# Patient Record
Sex: Female | Born: 1960
Health system: Southern US, Community
[De-identification: ages and names within clinical notes are randomized; demographics above are authoritative.]

## PROBLEM LIST (undated history)

## (undated) DIAGNOSIS — S129XXA Fracture of neck, unspecified, initial encounter: Secondary | ICD-10-CM

## (undated) DIAGNOSIS — C569 Malignant neoplasm of unspecified ovary: Secondary | ICD-10-CM

## (undated) DIAGNOSIS — B1920 Unspecified viral hepatitis C without hepatic coma: Secondary | ICD-10-CM

## (undated) DIAGNOSIS — E079 Disorder of thyroid, unspecified: Secondary | ICD-10-CM

## (undated) DIAGNOSIS — C801 Malignant (primary) neoplasm, unspecified: Secondary | ICD-10-CM

## (undated) DIAGNOSIS — R200 Anesthesia of skin: Secondary | ICD-10-CM

## (undated) DIAGNOSIS — R202 Paresthesia of skin: Secondary | ICD-10-CM

## (undated) DIAGNOSIS — T148XXA Other injury of unspecified body region, initial encounter: Secondary | ICD-10-CM

## (undated) DIAGNOSIS — R251 Tremor, unspecified: Secondary | ICD-10-CM

## (undated) DIAGNOSIS — H269 Unspecified cataract: Secondary | ICD-10-CM

## (undated) DIAGNOSIS — J449 Chronic obstructive pulmonary disease, unspecified: Secondary | ICD-10-CM

## (undated) DIAGNOSIS — G709 Myoneural disorder, unspecified: Secondary | ICD-10-CM

## (undated) DIAGNOSIS — K219 Gastro-esophageal reflux disease without esophagitis: Secondary | ICD-10-CM

## (undated) DIAGNOSIS — N189 Chronic kidney disease, unspecified: Secondary | ICD-10-CM

## (undated) DIAGNOSIS — K529 Noninfective gastroenteritis and colitis, unspecified: Secondary | ICD-10-CM

## (undated) DIAGNOSIS — I10 Essential (primary) hypertension: Secondary | ICD-10-CM

## (undated) HISTORY — DX: Unspecified cataract: H26.9

## (undated) HISTORY — DX: Tremor, unspecified: R25.1

## (undated) HISTORY — DX: Unspecified viral hepatitis C without hepatic coma: B19.20

## (undated) HISTORY — DX: Noninfective gastroenteritis and colitis, unspecified: K52.9

## (undated) HISTORY — PX: CHOLECYSTECTOMY: SHX55

## (undated) HISTORY — DX: Malignant neoplasm of unspecified ovary: C56.9

## (undated) HISTORY — PX: FOOT SURGERY: SHX648

## (undated) HISTORY — DX: Anesthesia of skin: R20.0

## (undated) HISTORY — PX: KNEE ARTHROSCOPY: SUR90

## (undated) HISTORY — PX: NECK SURGERY: SHX720

## (undated) HISTORY — DX: Other injury of unspecified body region, initial encounter: T14.8XXA

## (undated) HISTORY — PX: VAGINAL HYSTERECTOMY: SUR661

## (undated) HISTORY — DX: Chronic kidney disease, unspecified: N18.9

## (undated) HISTORY — DX: Chronic obstructive pulmonary disease, unspecified: J44.9

## (undated) HISTORY — DX: Fracture of neck, unspecified, initial encounter: S12.9XXA

## (undated) HISTORY — DX: Gastro-esophageal reflux disease without esophagitis: K21.9

## (undated) HISTORY — DX: Disorder of thyroid, unspecified: E07.9

## (undated) HISTORY — PX: SHOULDER ARTHROSCOPY: SHX128

## (undated) HISTORY — DX: Myoneural disorder, unspecified: G70.9

## (undated) HISTORY — DX: Essential (primary) hypertension: I10

## (undated) HISTORY — DX: Anesthesia of skin: R20.2

---
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
## 2014-08-07 DIAGNOSIS — F333 Major depressive disorder, recurrent, severe with psychotic symptoms: Secondary | ICD-10-CM | POA: Insufficient documentation

## 2014-08-07 DIAGNOSIS — J309 Allergic rhinitis, unspecified: Secondary | ICD-10-CM | POA: Insufficient documentation

## 2014-08-07 DIAGNOSIS — R5383 Other fatigue: Secondary | ICD-10-CM | POA: Insufficient documentation

## 2014-08-07 DIAGNOSIS — R059 Cough, unspecified: Secondary | ICD-10-CM | POA: Insufficient documentation

## 2014-08-07 DIAGNOSIS — F411 Generalized anxiety disorder: Secondary | ICD-10-CM | POA: Insufficient documentation

## 2014-08-07 DIAGNOSIS — R05 Cough: Secondary | ICD-10-CM | POA: Insufficient documentation

## 2014-08-07 DIAGNOSIS — F419 Anxiety disorder, unspecified: Secondary | ICD-10-CM | POA: Insufficient documentation

## 2014-08-07 DIAGNOSIS — R413 Other amnesia: Secondary | ICD-10-CM | POA: Insufficient documentation

## 2014-08-07 DIAGNOSIS — Z720 Tobacco use: Secondary | ICD-10-CM | POA: Insufficient documentation

## 2016-06-17 DIAGNOSIS — G4700 Insomnia, unspecified: Secondary | ICD-10-CM | POA: Insufficient documentation

## 2016-06-17 DIAGNOSIS — I10 Essential (primary) hypertension: Secondary | ICD-10-CM | POA: Insufficient documentation

## 2016-06-17 DIAGNOSIS — M159 Polyosteoarthritis, unspecified: Secondary | ICD-10-CM | POA: Insufficient documentation

## 2016-06-17 DIAGNOSIS — M8949 Other hypertrophic osteoarthropathy, multiple sites: Secondary | ICD-10-CM | POA: Insufficient documentation

## 2016-06-17 DIAGNOSIS — E039 Hypothyroidism, unspecified: Secondary | ICD-10-CM | POA: Insufficient documentation

## 2016-06-17 DIAGNOSIS — B1920 Unspecified viral hepatitis C without hepatic coma: Secondary | ICD-10-CM | POA: Insufficient documentation

## 2016-06-17 DIAGNOSIS — F339 Major depressive disorder, recurrent, unspecified: Secondary | ICD-10-CM | POA: Insufficient documentation

## 2016-06-17 DIAGNOSIS — H04129 Dry eye syndrome of unspecified lacrimal gland: Secondary | ICD-10-CM | POA: Insufficient documentation

## 2016-06-17 DIAGNOSIS — M5020 Other cervical disc displacement, unspecified cervical region: Secondary | ICD-10-CM | POA: Insufficient documentation

## 2016-06-17 DIAGNOSIS — M150 Primary generalized (osteo)arthritis: Secondary | ICD-10-CM

## 2016-06-17 DIAGNOSIS — R5381 Other malaise: Secondary | ICD-10-CM | POA: Insufficient documentation

## 2016-06-17 DIAGNOSIS — R5383 Other fatigue: Secondary | ICD-10-CM | POA: Insufficient documentation

## 2016-06-17 DIAGNOSIS — A6004 Herpesviral vulvovaginitis: Secondary | ICD-10-CM | POA: Insufficient documentation

## 2016-06-17 DIAGNOSIS — K219 Gastro-esophageal reflux disease without esophagitis: Secondary | ICD-10-CM | POA: Insufficient documentation

## 2016-06-17 DIAGNOSIS — Q613 Polycystic kidney, unspecified: Secondary | ICD-10-CM | POA: Insufficient documentation

## 2016-07-02 DIAGNOSIS — M25561 Pain in right knee: Secondary | ICD-10-CM | POA: Insufficient documentation

## 2016-07-26 ENCOUNTER — Ambulatory Visit (INDEPENDENT_AMBULATORY_CARE_PROVIDER_SITE_OTHER): Payer: Medicare Other | Admitting: Orthopaedic Surgery

## 2016-07-26 DIAGNOSIS — M25562 Pain in left knee: Secondary | ICD-10-CM | POA: Diagnosis not present

## 2016-07-26 DIAGNOSIS — M25512 Pain in left shoulder: Secondary | ICD-10-CM

## 2016-07-26 DIAGNOSIS — M545 Low back pain: Secondary | ICD-10-CM

## 2016-07-26 DIAGNOSIS — M25511 Pain in right shoulder: Secondary | ICD-10-CM

## 2016-07-26 DIAGNOSIS — M25561 Pain in right knee: Secondary | ICD-10-CM

## 2016-07-28 ENCOUNTER — Other Ambulatory Visit (HOSPITAL_COMMUNITY): Payer: Self-pay | Admitting: Gastroenterology

## 2016-07-28 ENCOUNTER — Other Ambulatory Visit: Payer: Self-pay | Admitting: Gastroenterology

## 2016-07-28 DIAGNOSIS — B1920 Unspecified viral hepatitis C without hepatic coma: Secondary | ICD-10-CM

## 2016-08-25 DIAGNOSIS — F1320 Sedative, hypnotic or anxiolytic dependence, uncomplicated: Secondary | ICD-10-CM | POA: Insufficient documentation

## 2016-08-26 DIAGNOSIS — F17200 Nicotine dependence, unspecified, uncomplicated: Secondary | ICD-10-CM | POA: Insufficient documentation

## 2016-08-26 DIAGNOSIS — F3181 Bipolar II disorder: Secondary | ICD-10-CM | POA: Insufficient documentation

## 2016-09-01 ENCOUNTER — Ambulatory Visit (HOSPITAL_COMMUNITY): Payer: Self-pay

## 2016-09-06 ENCOUNTER — Ambulatory Visit (INDEPENDENT_AMBULATORY_CARE_PROVIDER_SITE_OTHER): Payer: Medicare Other | Admitting: Orthopaedic Surgery

## 2016-09-06 VITALS — BP 121/77 | HR 65 | Resp 14

## 2016-09-06 DIAGNOSIS — M25512 Pain in left shoulder: Secondary | ICD-10-CM

## 2016-09-06 DIAGNOSIS — G8929 Other chronic pain: Secondary | ICD-10-CM | POA: Diagnosis not present

## 2016-09-06 MED ORDER — LIDOCAINE HCL 1 % IJ SOLN
2.0000 mL | INTRAMUSCULAR | Status: AC | PRN
  Administered 2016-09-06: 2 mL

## 2016-09-06 MED ORDER — BUPIVACAINE HCL 0.5 % IJ SOLN
2.0000 mL | INTRAMUSCULAR | Status: AC | PRN
  Administered 2016-09-06: 2 mL via INTRA_ARTICULAR

## 2016-09-06 MED ORDER — METHYLPREDNISOLONE ACETATE 40 MG/ML IJ SUSP
80.0000 mg | INTRAMUSCULAR | Status: AC | PRN
  Administered 2016-09-06: 80 mg

## 2016-09-06 NOTE — Progress Notes (Signed)
Office Visit Note   Patient: Raven Hansen           Date of Birth: 1961-09-07           MRN: [id number] Visit Date: 09/06/2016              Requested by: No referring provider defined for this encounter. PCP: No primary care provider on file.   Assessment & Plan: Visit Diagnoses: No diagnosis found.  Plan: f/u prn. Mrs. Kocinski was seen last month for evaluation of multiple joint complaints is outlined in her prior office note. Performed cortisone injections to both of her knees and she relates that made a big difference. She also has history of chronic right and left shoulder pain treated by her orthopedist in [place],   I will plan on injecting her right shoulder today and see her back in a when necessary basis. [place]. She plans on moving bacteria in the next several weeks and wanted to know if she could have cortisone injection in her right shoulder she's had prior rotator cuff surgery and has also been told she has "degenerative joint disease"  Follow-Up Instructions: No Follow-up on file.   Orders:  No orders of the defined types were placed in this encounter.  No orders of the defined types were placed in this encounter.     Procedures: Large Joint Inj Date/Time: 09/06/2016 5:31 PM Performed by: [name] Authorized by: [name]   Consent Given by:  Patient Timeout: prior to procedure the correct patient, procedure, and site was verified   Indications:  Pain Location:  Shoulder Site:  L subacromial bursa Prep: patient was prepped and draped in usual sterile fashion   Needle Size:  25 G Needle Length:  1.5 inches Approach:  Lateral Ultrasound Guidance: No   Fluoroscopic Guidance: No   Arthrogram: No   Medications:  2 mL bupivacaine 0.5 %; 2 mL lidocaine 1 %; 80 mg methylPREDNISolone acetate 40 MG/ML Aspiration Attempted: No   Patient tolerance:  Patient tolerated the procedure well with no immediate complications       Clinical  Data: No additional findings.   Subjective: No chief complaint on file.   BIL cortisone injections wanted today. Pt does not want to answer questions, states she will return to her family dr. Soon. Pt states both arms hurt and cannot lay on either side    Review of Systems   Objective: Vital Signs: There were no vitals taken for this visit.  Physical Exam  Ortho Exam right shoulder with some loss of overhead motion. He has prior arthroscopic portals that were nontender. He was minimal impingement. Pain seemed to be localized about the subacromial space and not at the glenohumeral joint. He was a patient. Neurologically she appeared to be intact  I will plan on injecting her right shoulder today as it is the more symptomatic and see her back in a when necessary basis.  Specialty Comments:  No specialty comments available.  Imaging: No results found.   PMFS History: There are no active problems to display for this patient.  No past medical history on file.  No family history on file.  No past surgical history on file. Social History   Occupational History  . Not on file.   Social History Main Topics  . Smoking status: Not on file  . Smokeless tobacco: Not on file  . Alcohol use Not on file  . Drug use: Unknown  . Sexual activity: Not  on file

## 2016-09-07 ENCOUNTER — Ambulatory Visit (HOSPITAL_COMMUNITY): Payer: Self-pay

## 2016-10-22 ENCOUNTER — Ambulatory Visit (INDEPENDENT_AMBULATORY_CARE_PROVIDER_SITE_OTHER): Payer: Medicare Other | Admitting: Orthopaedic Surgery

## 2017-01-14 DIAGNOSIS — K529 Noninfective gastroenteritis and colitis, unspecified: Secondary | ICD-10-CM | POA: Insufficient documentation

## 2017-01-19 DIAGNOSIS — J42 Unspecified chronic bronchitis: Secondary | ICD-10-CM | POA: Insufficient documentation

## 2017-01-19 DIAGNOSIS — M5090 Cervical disc disorder, unspecified, unspecified cervical region: Secondary | ICD-10-CM | POA: Insufficient documentation

## 2017-01-19 DIAGNOSIS — G43519 Persistent migraine aura without cerebral infarction, intractable, without status migrainosus: Secondary | ICD-10-CM | POA: Insufficient documentation

## 2017-01-19 DIAGNOSIS — S90111A Contusion of right great toe without damage to nail, initial encounter: Secondary | ICD-10-CM | POA: Insufficient documentation

## 2017-01-19 DIAGNOSIS — F419 Anxiety disorder, unspecified: Secondary | ICD-10-CM | POA: Insufficient documentation

## 2017-01-19 DIAGNOSIS — J069 Acute upper respiratory infection, unspecified: Secondary | ICD-10-CM | POA: Insufficient documentation

## 2017-01-19 DIAGNOSIS — F329 Major depressive disorder, single episode, unspecified: Secondary | ICD-10-CM | POA: Insufficient documentation

## 2017-02-17 DIAGNOSIS — S83519A Sprain of anterior cruciate ligament of unspecified knee, initial encounter: Secondary | ICD-10-CM | POA: Insufficient documentation

## 2017-02-17 DIAGNOSIS — R569 Unspecified convulsions: Secondary | ICD-10-CM | POA: Insufficient documentation

## 2017-02-17 DIAGNOSIS — K047 Periapical abscess without sinus: Secondary | ICD-10-CM | POA: Insufficient documentation

## 2017-02-17 DIAGNOSIS — M1710 Unilateral primary osteoarthritis, unspecified knee: Secondary | ICD-10-CM | POA: Insufficient documentation

## 2017-02-17 DIAGNOSIS — L720 Epidermal cyst: Secondary | ICD-10-CM | POA: Insufficient documentation

## 2017-02-17 DIAGNOSIS — M961 Postlaminectomy syndrome, not elsewhere classified: Secondary | ICD-10-CM | POA: Insufficient documentation

## 2017-02-17 DIAGNOSIS — M7541 Impingement syndrome of right shoulder: Secondary | ICD-10-CM | POA: Insufficient documentation

## 2017-02-17 DIAGNOSIS — R233 Spontaneous ecchymoses: Secondary | ICD-10-CM | POA: Insufficient documentation

## 2017-02-17 DIAGNOSIS — J200 Acute bronchitis due to Mycoplasma pneumoniae: Secondary | ICD-10-CM | POA: Insufficient documentation

## 2017-02-17 DIAGNOSIS — G894 Chronic pain syndrome: Secondary | ICD-10-CM | POA: Insufficient documentation

## 2017-02-17 DIAGNOSIS — M179 Osteoarthritis of knee, unspecified: Secondary | ICD-10-CM | POA: Insufficient documentation

## 2017-02-17 DIAGNOSIS — N649 Disorder of breast, unspecified: Secondary | ICD-10-CM | POA: Insufficient documentation

## 2017-02-17 DIAGNOSIS — E162 Hypoglycemia, unspecified: Secondary | ICD-10-CM | POA: Insufficient documentation

## 2017-02-17 DIAGNOSIS — D649 Anemia, unspecified: Secondary | ICD-10-CM | POA: Insufficient documentation

## 2017-02-17 DIAGNOSIS — M5412 Radiculopathy, cervical region: Secondary | ICD-10-CM | POA: Insufficient documentation

## 2017-03-10 DIAGNOSIS — M7918 Myalgia, other site: Secondary | ICD-10-CM | POA: Insufficient documentation

## 2017-03-10 DIAGNOSIS — L237 Allergic contact dermatitis due to plants, except food: Secondary | ICD-10-CM | POA: Insufficient documentation

## 2017-04-13 DIAGNOSIS — F3170 Bipolar disorder, currently in remission, most recent episode unspecified: Secondary | ICD-10-CM | POA: Insufficient documentation

## 2017-06-22 DIAGNOSIS — Z803 Family history of malignant neoplasm of breast: Secondary | ICD-10-CM | POA: Insufficient documentation

## 2017-06-22 DIAGNOSIS — Z8543 Personal history of malignant neoplasm of ovary: Secondary | ICD-10-CM | POA: Insufficient documentation

## 2017-06-30 DIAGNOSIS — S50811A Abrasion of right forearm, initial encounter: Secondary | ICD-10-CM | POA: Insufficient documentation

## 2017-06-30 DIAGNOSIS — W5503XA Scratched by cat, initial encounter: Secondary | ICD-10-CM | POA: Insufficient documentation

## 2017-07-14 DIAGNOSIS — R2231 Localized swelling, mass and lump, right upper limb: Secondary | ICD-10-CM | POA: Insufficient documentation

## 2017-12-14 DIAGNOSIS — Z01818 Encounter for other preprocedural examination: Secondary | ICD-10-CM | POA: Insufficient documentation

## 2018-04-14 DIAGNOSIS — L239 Allergic contact dermatitis, unspecified cause: Secondary | ICD-10-CM | POA: Insufficient documentation

## 2018-05-09 DIAGNOSIS — Z209 Contact with and (suspected) exposure to unspecified communicable disease: Secondary | ICD-10-CM | POA: Insufficient documentation

## 2018-05-09 DIAGNOSIS — F40243 Fear of flying: Secondary | ICD-10-CM | POA: Insufficient documentation

## 2018-05-09 DIAGNOSIS — E782 Mixed hyperlipidemia: Secondary | ICD-10-CM | POA: Insufficient documentation

## 2018-05-09 DIAGNOSIS — Z91030 Bee allergy status: Secondary | ICD-10-CM | POA: Insufficient documentation

## 2018-09-14 ENCOUNTER — Ambulatory Visit (INDEPENDENT_AMBULATORY_CARE_PROVIDER_SITE_OTHER): Payer: Medicare Other

## 2018-09-14 ENCOUNTER — Ambulatory Visit (INDEPENDENT_AMBULATORY_CARE_PROVIDER_SITE_OTHER): Payer: Medicare Other | Admitting: Orthopaedic Surgery

## 2018-09-14 ENCOUNTER — Encounter (INDEPENDENT_AMBULATORY_CARE_PROVIDER_SITE_OTHER): Payer: Self-pay | Admitting: Orthopaedic Surgery

## 2018-09-14 VITALS — BP 137/98 | HR 70 | Resp 18 | Ht 64.0 in | Wt 167.0 lb

## 2018-09-14 DIAGNOSIS — M25511 Pain in right shoulder: Secondary | ICD-10-CM

## 2018-09-14 DIAGNOSIS — M25512 Pain in left shoulder: Secondary | ICD-10-CM

## 2018-09-14 DIAGNOSIS — G8929 Other chronic pain: Secondary | ICD-10-CM

## 2018-09-14 MED ORDER — LIDOCAINE HCL 2 % IJ SOLN
2.0000 mL | INTRAMUSCULAR | Status: AC | PRN
  Administered 2018-09-14: 2 mL

## 2018-09-14 MED ORDER — BUPIVACAINE HCL 0.5 % IJ SOLN
2.0000 mL | INTRAMUSCULAR | Status: AC | PRN
  Administered 2018-09-14: 2 mL via INTRA_ARTICULAR

## 2018-09-14 MED ORDER — METHYLPREDNISOLONE ACETATE 40 MG/ML IJ SUSP
80.0000 mg | INTRAMUSCULAR | Status: AC | PRN
  Administered 2018-09-14: 80 mg

## 2018-09-14 NOTE — Progress Notes (Signed)
Office Visit Note   Patient: Raven Hansen           Date of Birth: 28-Dec-1960           MRN: [id number] Visit Date: 09/14/2018              Requested by: No referring provider defined for this encounter. PCP: No primary care provider on file.   Assessment & Plan: Visit Diagnoses:  1. Chronic right shoulder pain     Plan: Impingement syndrome both shoulders by history and reviewing her prior charts.  Could have some very early arthritis in her right shoulder.  She has responded to subacromial cortisone injections in the past.  Will inject both shoulders today referred to Dr. Sharol Given for her right foot  Follow-Up Instructions: Return refer to Dr Sharol Given for right foot problem.   Orders:  Orders Placed This Encounter  Procedures  . XR Shoulder Right   No orders of the defined types were placed in this encounter.     Procedures: Large Joint Inj: R subacromial bursa on 09/14/2018 3:46 PM Indications: pain and diagnostic evaluation Details: 25 G 1.5 in needle, anterolateral approach  Arthrogram: No  Medications: 2 mL bupivacaine 0.5 %; 2 mL lidocaine 2 %; 80 mg methylPREDNISolone acetate 40 MG/ML Consent was given by the patient. Immediately prior to procedure a time out was called to verify the correct patient, procedure, equipment, support staff and site/side marked as required. Patient was prepped and draped in the usual sterile fashion.   Large Joint Inj: L subacromial bursa on 09/14/2018 3:48 PM Indications: pain and diagnostic evaluation Details: 25 G 1.5 in needle, anterolateral approach  Arthrogram: No  Medications: 2 mL lidocaine 2 %; 2 mL bupivacaine 0.5 %; 80 mg methylPREDNISolone acetate 40 MG/ML Consent was given by the patient. Immediately prior to procedure a time out was called to verify the correct patient, procedure, equipment, support staff and site/side marked as required. Patient was prepped and draped in the usual sterile fashion.       Clinical  Data: No additional findings.   Subjective: Chief Complaint  Patient presents with  . Left Shoulder - Pain  . Right Shoulder - Pain  . Right Foot - Pain  . Shoulder Pain    Bil shoulder pain, no injury, right rotator cuff, bone spurs surgery x 40 years [place], Neck surgery 2010 [place], limited range of motion, right worse than left, difficulty sleeping at night, Tylenol, Gabapentin, helps  . Foot Pain    Right foot pain, dropped log on foot, fusion, 12/19/17 [place], screw broke 05/23/18, severe pain, Tylenol, difficulty walking,  Raven Hansen visited the office for evaluation of a number of problems.  She recently has been living in [place] and has had surgery on her right foot with apparent nonunion.  She does wear a boot and notes that she has a "broken screw".  She had asked for referral to [facility] but I told her that her treating physician in [place] should make that call and send all of all records.  In lieu of that I could have her see Dr. Sharol Given. She also relates having had rotator cuff surgery in her right shoulder "30 years ago".  She is developed some pain with motion of her shoulder and no recurrent injury or trauma.  She is having trouble sleeping and moving her arm over her head.  No numbness or tingling. Saw Mrs. Uplinger in November 2017 for similar problems with both of her shoulders.  I did inject the right shoulder with good relief  HPI  Review of Systems  Constitutional: Negative for fatigue.  HENT: Negative for trouble swallowing.   Eyes: Negative for pain.  Respiratory: Negative for shortness of breath.   Cardiovascular: Negative for leg swelling.  Gastrointestinal: Positive for diarrhea.  Endocrine: Positive for cold intolerance.  Genitourinary: Negative for difficulty urinating.  Musculoskeletal: Positive for joint swelling, neck pain and neck stiffness.  Skin: Negative for rash.  Allergic/Immunologic: Negative for food allergies.  Neurological:  Positive for weakness and numbness.  Hematological: Does not bruise/bleed easily.  Psychiatric/Behavioral: Positive for sleep disturbance.     Objective: Vital Signs: BP (!) 137/98 (BP Location: Left Arm, Patient Position: Sitting, Cuff Size: Normal)   Pulse 70   Resp 18   Ht 5\' 4"  (1.626 m)   Wt 163 lb (73.9 kg)   BMI 27.98 kg/m   Physical Exam  Constitutional: She is oriented to person, place, and time. She appears well-developed and well-nourished.  HENT:  Mouth/Throat: Oropharynx is clear and moist.  Eyes: Pupils are equal, round, and reactive to light. EOM are normal.  Pulmonary/Chest: Effort normal.  Neurological: She is alert and oriented to person, place, and time.  Skin: Skin is warm and dry.  Psychiatric: She has a normal mood and affect. Her behavior is normal.    Ortho Exam wearing equalizer boot right foot.  Exam not performed.  Right shoulder with well-healed arthroscopic portals from her prior surgery in [place].  Full overhead motion.  Mild impingement symptoms.  Good grip and good release.  Biceps intact.  Good strength with internal/external rotation.  Specialty Comments:  No specialty comments available.  Imaging: No results found.   PMFS History: There are no active problems to display for this patient.  Past Medical History:  Diagnosis Date  . Acid reflux   . Colitis   . Hypertension   . Thyroid disease     History reviewed. No pertinent family history.  Past Surgical History:  Procedure Laterality Date  . CHOLECYSTECTOMY    . FOOT SURGERY    . KNEE ARTHROSCOPY    . NECK SURGERY    . SHOULDER ARTHROSCOPY    . VAGINAL HYSTERECTOMY     Social History   Occupational History  . Not on file  Tobacco Use  . Smoking status: Current Every Day Smoker    Packs/day: 0.50    Years: 37.00    Pack years: 18.50    Types: Cigarettes  . Smokeless tobacco: Never Used  Substance and Sexual Activity  . Alcohol use: Yes    Comment: RARELY    . Drug use: Never  . Sexual activity: Not on file

## 2018-09-19 ENCOUNTER — Other Ambulatory Visit: Payer: Self-pay

## 2018-09-19 ENCOUNTER — Ambulatory Visit (INDEPENDENT_AMBULATORY_CARE_PROVIDER_SITE_OTHER): Payer: Medicare Other | Admitting: Podiatry

## 2018-09-19 DIAGNOSIS — M9689 Other intraoperative and postprocedural complications and disorders of the musculoskeletal system: Secondary | ICD-10-CM

## 2018-09-19 DIAGNOSIS — Z9889 Other specified postprocedural states: Secondary | ICD-10-CM

## 2018-09-19 DIAGNOSIS — M24176 Other articular cartilage disorders, unspecified foot: Secondary | ICD-10-CM

## 2018-09-19 DIAGNOSIS — Z720 Tobacco use: Secondary | ICD-10-CM | POA: Diagnosis not present

## 2018-09-19 DIAGNOSIS — M249 Joint derangement, unspecified: Secondary | ICD-10-CM

## 2018-09-19 DIAGNOSIS — M24173 Other articular cartilage disorders, unspecified ankle: Secondary | ICD-10-CM

## 2018-09-19 DIAGNOSIS — Z978 Presence of other specified devices: Secondary | ICD-10-CM

## 2018-09-19 DIAGNOSIS — T849XXA Unspecified complication of internal orthopedic prosthetic device, implant and graft, initial encounter: Secondary | ICD-10-CM

## 2018-09-19 DIAGNOSIS — M25571 Pain in right ankle and joints of right foot: Secondary | ICD-10-CM | POA: Diagnosis not present

## 2018-09-19 NOTE — Progress Notes (Signed)
Subjective:  Patient ID: Raven Hansen, female    DOB: Feb 24, 1961,  MRN: [id number]  No chief complaint on file.   57 y.o. female presents with the above complaint.  Reports history of first metatarsophalangeal joint arthrodesis to the right foot 12/19/2017.  States that her postop course uneventful and he did not hurt after she was cleared to resume normal activity.  States that on July 30th  she injured her foot going up a ladder and went for evaluation and was told that 1 of the screws is broken.  At present she has 10-10 sharp constant pain worse with walking.  Has tried icing Tylenol and is wearing her cam boot.  Admits to current tobacco use.  Review of Systems: Negative except as noted in the HPI. Denies N/V/F/Ch.  Past Medical History:  Diagnosis Date  . Acid reflux   . Colitis   . Hypertension   . Thyroid disease     Current Outpatient Medications:  .  albuterol (VENTOLIN HFA) 108 (90 Base) MCG/ACT inhaler, inhale 2 puffs by mouth every 4 hours for wheezing, Disp: , Rfl:  .  atenolol (TENORMIN) 50 MG tablet, Take 50 mg by mouth daily., Disp: , Rfl: 0 .  cephALEXin (KEFLEX) 500 MG capsule, Take 500 mg by mouth 4 (four) times daily., Disp: , Rfl:  .  cholestyramine (QUESTRAN) 4 g packet, MIX 1 PACKET as directed and take by mouth four times a day, Disp: , Rfl:  .  fluticasone (FLONASE) 50 MCG/ACT nasal spray, INSTILL 1 SPRAY INTO EACH NOSTRIL TWICE A DAY, Disp: , Rfl: 2 .  gabapentin (NEURONTIN) 600 MG tablet, Take 300 mg by mouth., Disp: , Rfl:  .  HYDROcodone-acetaminophen (NORCO/VICODIN) 5-325 MG tablet, , Disp: , Rfl:  .  ibuprofen (ADVIL,MOTRIN) 800 MG tablet, take 1 tablet by mouth three times a day if needed for pain, Disp: , Rfl:  .  lisinopril-hydrochlorothiazide (PRINZIDE,ZESTORETIC) 20-25 MG tablet, TK 1 T PO QD, Disp: , Rfl: 8 .  SUMAtriptan (IMITREX) 100 MG tablet, Take 100 mg by mouth., Disp: , Rfl:  .  SYNTHROID 125 MCG tablet, TK 1 T PO DAILY 20 TO 30 MINUTES  BEFORE FIRST MEAL, Disp: , Rfl: 2 .  valACYclovir (VALTREX) 1000 MG tablet, Take 1,000 mg by mouth., Disp: , Rfl:   Social History   Tobacco Use  Smoking Status Current Every Day Smoker  . Packs/day: 0.50  . Years: 37.00  . Pack years: 18.50  . Types: Cigarettes  Smokeless Tobacco Never Used    Allergies  Allergen Reactions  . Bee Venom Swelling  . Demerol [Meperidine Hcl]   . Meperidine Other (See Comments)    headaches   Objective:  There were no vitals filed for this visit. There is no height or weight on file to calculate BMI. Constitutional Well developed. Well nourished.  Vascular Dorsalis pedis pulses palpable bilaterally. Posterior tibial pulses palpable bilaterally. Capillary refill normal to all digits.  No cyanosis or clubbing noted. Pedal hair growth normal.  Neurologic Normal speech. Oriented to person, place, and time. Epicritic sensation to light touch grossly present bilaterally.  Dermatologic Nails well groomed and normal in appearance. No open wounds. No skin lesions.  Orthopedic: Normal joint ROM without pain or crepitus bilaterally. No visible deformities. Pain palpation about the first metatarsophalangeal joint right foot with slight appreciable motion.  The joint does not appear clinically fused.   Radiographs: Prior XR reviewed. 1st MPJ fusion site with incomplete consolidation. Crossed screw construct with  failure of one of the screws.   Assessment:   1. 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 Pain in joint of right foot   3. Tobacco abuse   4. History of foot surgery   5. Joint derangement of ankle or foot   6. Orthopedic hardware present   7. Orthopedic device, implant, or graft complication ([place])    Plan:  Patient was evaluated and treated and all questions answered.  History of right first metatarsal phalangeal joint arthrodesis with evidence of nonunion, hardware failure -Prior x-rays and records were reviewed.  Evidence of broken  screw. -I do not believe that the mechanism of injury by itself would need to the fracture of the hardware and is more likely that the osteotomy site never appropriately healed. -Would consider nonunion work-up including blood work to rule out infected nonunion, possible CT scan -I do think she would benefit from revision of the arthrodesis site with likely bone graft.  Discussed postop course at length including extended nonweightbearing.  Discussed need for cessation of smoking in order to promote optimal bone healing. -I discussed with patient that I think she needs a referral to a surgeon who has more expertise in complicated revision cases.  We will plan for referral. -No pain medicines Rx at this time as she has chronic pain and will need to follow-up with either pain management or her PCP.  45 minutes of face to face time were spent with the patient. >50% of this was spent on counseling and coordination of care. Specifically discussed with patient the above diagnoses and overall treatment plan.   No follow-ups on file.

## 2018-09-20 ENCOUNTER — Telehealth: Payer: Self-pay | Admitting: *Deleted

## 2018-09-20 DIAGNOSIS — M9689 Other intraoperative and postprocedural complications and disorders of the musculoskeletal system: Secondary | ICD-10-CM

## 2018-09-20 DIAGNOSIS — Z978 Presence of other specified devices: Secondary | ICD-10-CM

## 2018-09-20 DIAGNOSIS — M24176 Other articular cartilage disorders, unspecified foot: Secondary | ICD-10-CM

## 2018-09-20 DIAGNOSIS — Z9889 Other specified postprocedural states: Secondary | ICD-10-CM

## 2018-09-20 DIAGNOSIS — M249 Joint derangement, unspecified: Secondary | ICD-10-CM

## 2018-09-20 DIAGNOSIS — M24173 Other articular cartilage disorders, unspecified ankle: Secondary | ICD-10-CM

## 2018-09-20 DIAGNOSIS — M25571 Pain in right ankle and joints of right foot: Secondary | ICD-10-CM

## 2018-09-20 NOTE — Telephone Encounter (Signed)
Dr. March Rummage states refer pt to [facility] [phone]. Faxed referral, clinicals and demographics to [facility].

## 2018-09-20 NOTE — Telephone Encounter (Signed)
-----   Message from Evelina Bucy, DPM sent at 09/20/2018 10:58 AM EST ----- Can we refer to [facility] for surgical eval?

## 2018-09-20 NOTE — Telephone Encounter (Signed)
-----   Message from Evelina Bucy, DPM sent at 09/20/2018 10:58 AM EST ----- Can we refer to [facility] for surgical eval?

## 2018-09-28 ENCOUNTER — Ambulatory Visit (INDEPENDENT_AMBULATORY_CARE_PROVIDER_SITE_OTHER): Payer: Medicare Other

## 2018-09-28 ENCOUNTER — Ambulatory Visit (INDEPENDENT_AMBULATORY_CARE_PROVIDER_SITE_OTHER): Payer: Self-pay

## 2018-09-28 ENCOUNTER — Encounter (INDEPENDENT_AMBULATORY_CARE_PROVIDER_SITE_OTHER): Payer: Self-pay | Admitting: Orthopedic Surgery

## 2018-09-28 ENCOUNTER — Ambulatory Visit (INDEPENDENT_AMBULATORY_CARE_PROVIDER_SITE_OTHER): Payer: Medicare Other | Admitting: Orthopedic Surgery

## 2018-09-28 VITALS — Ht 64.0 in | Wt 167.0 lb

## 2018-09-28 DIAGNOSIS — M25562 Pain in left knee: Secondary | ICD-10-CM | POA: Diagnosis not present

## 2018-09-28 DIAGNOSIS — Q7021 Fused toes, right foot: Secondary | ICD-10-CM

## 2018-09-28 DIAGNOSIS — M79671 Pain in right foot: Secondary | ICD-10-CM

## 2018-09-28 MED ORDER — OXYCODONE-ACETAMINOPHEN 5-325 MG PO TABS: 1.0000 | ORAL_TABLET | Freq: Three times a day (TID) | ORAL | 0 refills | Status: DC | PRN

## 2018-09-28 MED ORDER — GABAPENTIN 300 MG PO CAPS: 300.0000 mg | ORAL_CAPSULE | Freq: Three times a day (TID) | ORAL | 3 refills | Status: DC

## 2018-09-28 MED ORDER — IBUPROFEN 800 MG PO TABS: 800.0000 mg | ORAL_TABLET | Freq: Three times a day (TID) | ORAL | 0 refills | Status: DC | PRN

## 2018-09-29 ENCOUNTER — Encounter (INDEPENDENT_AMBULATORY_CARE_PROVIDER_SITE_OTHER): Payer: Self-pay | Admitting: Orthopedic Surgery

## 2018-09-29 DIAGNOSIS — M25562 Pain in left knee: Secondary | ICD-10-CM | POA: Diagnosis not present

## 2018-09-29 DIAGNOSIS — M79671 Pain in right foot: Secondary | ICD-10-CM | POA: Diagnosis not present

## 2018-09-29 MED ORDER — LIDOCAINE HCL 1 % IJ SOLN
5.0000 mL | INTRAMUSCULAR | Status: AC | PRN
  Administered 2018-09-29: 5 mL

## 2018-09-29 MED ORDER — METHYLPREDNISOLONE ACETATE 40 MG/ML IJ SUSP
40.0000 mg | INTRAMUSCULAR | Status: AC | PRN
  Administered 2018-09-29: 40 mg via INTRA_ARTICULAR

## 2018-09-29 NOTE — Progress Notes (Signed)
Office Visit Note   Patient: Raven Hansen           Date of Birth: Oct 16, 1961           MRN: [id number] Visit Date: 09/28/2018              Requested by: No referring provider defined for this encounter. PCP: Patient, No Pcp Per  Chief Complaint  Patient presents with  . Right Foot - Pain      HPI: Patient is a 57 year old woman who presents with 2 separate problems.  She did have hallux rigidus and initially underwent MTP fusion of the right foot in February 2019 in [place].  Patient was seen for a second opinion and told she had 1 broken screw.  Patient states she has been on hepatitis C medicine in 2003 and states that her markers are negative at this time.  Patient has been on Neurontin for her foot pain as well as ibuprofen.  Patient brings the notes from both her surgeon and the second opinion surgeon.  These were reviewed.  Patient will send [place] copies of these notes.  She also brought radiographs on a CD discs and these were also reviewed.  Assessment & Plan: Visit Diagnoses:  1. Right foot pain   2. Acute pain of left knee   3. 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  Recommended removal of the retained hardware placement of a dorsal Synthes plate.  Risks and benefits were discussed including infection nonunion need for additional surgery shortening of the toe.  Patient states she understands and wishes to proceed.  Patient does have radiographs consistent with pseudogout of the left knee.  This was injected she tolerated this well we will follow-up as needed for the knee.  Patient was sent in a refill for her Neurontin and ibuprofen.  Follow-Up Instructions: Return if symptoms worsen or fail to improve.   Ortho Exam  Patient is alert, oriented, no adenopathy, well-dressed, normal affect, normal respiratory effort. Examination of  the left lower extremity patient has crepitation in the patellofemoral joint with range of motion collaterals  and cruciates are stable.  There is no tenderness to palpation over the medial or lateral joint line.  There is no redness no cellulitis no effusion.  Examination of the right foot she has good pulses there is no redness no cellulitis there is tenderness to palpation around the MTP joint pain with attempted range of motion.  Radiographs shows 2 broken crossing screws.  Review of the records show these are Arthrex cannulated screws.  Imaging: No results found. No images are attached to the encounter.  Labs: No results found for: HGBA1C, ESRSEDRATE, CRP, LABURIC, REPTSTATUS, GRAMSTAIN, CULT, LABORGA   No results found for: ALBUMIN, PREALBUMIN, LABURIC  Body mass index is 28.67 kg/m.  Orders:  Orders Placed This Encounter  Procedures  . XR Knee 1-2 Views Left   Meds ordered this encounter  Medications  . oxyCODONE-acetaminophen (PERCOCET/ROXICET) 5-325 MG tablet    Sig: Take 1 tablet by mouth every 8 (eight) hours as needed.    Dispense:  10 tablet    Refill:  0  . gabapentin (NEURONTIN) 300 MG capsule    Sig: Take 1 capsule (300 mg total) by mouth 3 (three) times daily. 3 times a day when necessary neuropathy pain  Dispense:  90 capsule    Refill:  3  . ibuprofen (ADVIL,MOTRIN) 800 MG tablet    Sig: Take 1 tablet (800 mg total) by mouth every 8 (eight) hours as needed.    Dispense:  30 tablet    Refill:  0     Procedures: Large Joint Inj: L knee on 09/29/2018 4:13 PM Indications: pain and diagnostic evaluation Details: 22 G 1.5 in needle, anteromedial approach  Arthrogram: No  Medications: 5 mL lidocaine 1 %; 40 mg methylPREDNISolone acetate 40 MG/ML Outcome: tolerated well, no immediate complications Procedure, treatment alternatives, risks and benefits explained, specific risks discussed. Consent was given by the patient. Immediately prior to procedure a time  out was called to verify the correct patient, procedure, equipment, support staff and site/side marked as required. Patient was prepped and draped in the usual sterile fashion.      Clinical Data: No additional findings.  ROS:  All other systems negative, except as noted in the HPI. Review of Systems  Objective: Vital Signs: Ht 5\' 4"  (1.626 m)   Wt 167 lb (75.8 kg)   BMI 28.67 kg/m   Specialty Comments:  No specialty comments available.  PMFS History: Patient Active Problem List   Diagnosis Date Noted  . Exposure to communicable disease 05/09/2018  . Flying phobia 05/09/2018  . H/O bee sting allergy 05/09/2018  . Mixed hyperlipidemia 05/09/2018  . Allergic contact dermatitis 04/14/2018  . Preop general physical exam 12/14/2017  . Mass of right hand 07/14/2017  . Cat scratch of forearm, right, initial encounter 06/30/2017  . Family history of breast cancer 06/22/2017  . History of ovarian cancer 06/22/2017  . Bipolar affective disorder in remission ([place]) 04/13/2017  . Allergic dermatitis due to poison ivy 03/10/2017  . Myofascial pain 03/10/2017  . Acute bronchitis due to Mycoplasma pneumoniae 02/17/2017  . Anemia 02/17/2017  . Cervical radiculopathy 02/17/2017  . Chronic pain syndrome 02/17/2017  . Complete tear of anterior cruciate ligament of knee 02/17/2017  . Dental infection 02/17/2017  . Epidermal cyst 02/17/2017  . Failed back surgical syndrome 02/17/2017  . Hypoglycemia 02/17/2017  . Lesion of nipple 02/17/2017  . Osteoarthrosis of knee 02/17/2017  . Petechiae 02/17/2017  . Rotator cuff impingement syndrome of right shoulder 02/17/2017  . Seizures ([place]) 02/17/2017  . Anxiety and depression 01/19/2017  . Cervical disc disease 01/19/2017  . Chronic bronchitis ([place]) 01/19/2017  . Contusion of right great toe without damage to nail 01/19/2017  . Migraine aura, persistent, intractable 01/19/2017  . Viral upper respiratory tract infection 01/19/2017  .  Chronic diarrhea 01/14/2017  . Bipolar 2 disorder, major depressive episode ([place]) 08/26/2016  . Tobacco use disorder, severe, dependence 08/26/2016  . Benzodiazepine dependence ([place]) 08/25/2016  . Pain in right knee 07/02/2016  . Dry eye syndrome 06/17/2016  . Episode of recurrent major depressive disorder ([place]) 06/17/2016  . Essential hypertension 06/17/2016  . GERD without esophagitis 06/17/2016  . Hepatitis C 06/17/2016  . Herniated cervical disc 06/17/2016  . Herpes simplex vulvovaginitis 06/17/2016  . Hypothyroidism (acquired) 06/17/2016  . Insomnia 06/17/2016  . Malaise and fatigue 06/17/2016  . Polycystic kidney disease 06/17/2016  . Primary osteoarthritis involving multiple joints 06/17/2016  . Allergic rhinitis 08/07/2014  . Anxiety state 08/07/2014  . Cough 08/07/2014  . Other amnesia 08/07/2014  . Other fatigue 08/07/2014  . Severe episode of recurrent major depressive disorder, with psychotic features ([place]) 08/07/2014  . Tobacco use 08/07/2014   Past Medical History:  Diagnosis Date  .  Acid reflux   . Colitis   . Hypertension   . Thyroid disease     History reviewed. No pertinent family history.  Past Surgical History:  Procedure Laterality Date  . CHOLECYSTECTOMY    . FOOT SURGERY    . KNEE ARTHROSCOPY    . NECK SURGERY    . SHOULDER ARTHROSCOPY    . VAGINAL HYSTERECTOMY     Social History   Occupational History  . Not on file  Tobacco Use  . Smoking status: Current Every Day Smoker    Packs/day: 0.50    Years: 37.00    Pack years: 18.50    Types: Cigarettes  . Smokeless tobacco: Never Used  Substance and Sexual Activity  . Alcohol use: Yes    Comment: RARELY  . Drug use: Never  . Sexual activity: Not on file

## 2018-10-09 DIAGNOSIS — M79671 Pain in right foot: Secondary | ICD-10-CM | POA: Diagnosis not present

## 2018-10-12 DIAGNOSIS — I10 Essential (primary) hypertension: Secondary | ICD-10-CM | POA: Diagnosis not present

## 2018-10-12 DIAGNOSIS — M79671 Pain in right foot: Secondary | ICD-10-CM | POA: Diagnosis not present

## 2018-10-12 DIAGNOSIS — E039 Hypothyroidism, unspecified: Secondary | ICD-10-CM | POA: Diagnosis not present

## 2018-10-19 DIAGNOSIS — Z01818 Encounter for other preprocedural examination: Secondary | ICD-10-CM | POA: Diagnosis not present

## 2018-10-19 DIAGNOSIS — I10 Essential (primary) hypertension: Secondary | ICD-10-CM | POA: Diagnosis not present

## 2018-10-19 DIAGNOSIS — E039 Hypothyroidism, unspecified: Secondary | ICD-10-CM | POA: Diagnosis not present

## 2018-10-19 DIAGNOSIS — K5190 Ulcerative colitis, unspecified, without complications: Secondary | ICD-10-CM | POA: Diagnosis not present

## 2018-10-20 DIAGNOSIS — I10 Essential (primary) hypertension: Secondary | ICD-10-CM | POA: Diagnosis not present

## 2018-10-23 DIAGNOSIS — M79671 Pain in right foot: Secondary | ICD-10-CM | POA: Diagnosis not present

## 2018-10-26 DIAGNOSIS — Z91030 Bee allergy status: Secondary | ICD-10-CM | POA: Diagnosis not present

## 2018-10-26 DIAGNOSIS — M1990 Unspecified osteoarthritis, unspecified site: Secondary | ICD-10-CM | POA: Diagnosis not present

## 2018-10-26 DIAGNOSIS — J449 Chronic obstructive pulmonary disease, unspecified: Secondary | ICD-10-CM | POA: Diagnosis not present

## 2018-10-26 DIAGNOSIS — K219 Gastro-esophageal reflux disease without esophagitis: Secondary | ICD-10-CM | POA: Diagnosis not present

## 2018-10-26 DIAGNOSIS — Z885 Allergy status to narcotic agent status: Secondary | ICD-10-CM | POA: Diagnosis not present

## 2018-10-26 DIAGNOSIS — T8489XA Other specified complication of internal orthopedic prosthetic devices, implants and grafts, initial encounter: Secondary | ICD-10-CM | POA: Diagnosis not present

## 2018-10-26 DIAGNOSIS — Z87891 Personal history of nicotine dependence: Secondary | ICD-10-CM | POA: Diagnosis not present

## 2018-10-26 DIAGNOSIS — E039 Hypothyroidism, unspecified: Secondary | ICD-10-CM | POA: Diagnosis not present

## 2018-10-26 DIAGNOSIS — Z79899 Other long term (current) drug therapy: Secondary | ICD-10-CM | POA: Diagnosis not present

## 2018-10-26 DIAGNOSIS — Z888 Allergy status to other drugs, medicaments and biological substances status: Secondary | ICD-10-CM | POA: Diagnosis not present

## 2018-10-26 DIAGNOSIS — N189 Chronic kidney disease, unspecified: Secondary | ICD-10-CM | POA: Diagnosis not present

## 2018-10-26 DIAGNOSIS — T84213A Breakdown (mechanical) of internal fixation device of bones of foot and toes, initial encounter: Secondary | ICD-10-CM | POA: Diagnosis not present

## 2018-10-26 DIAGNOSIS — I129 Hypertensive chronic kidney disease with stage 1 through stage 4 chronic kidney disease, or unspecified chronic kidney disease: Secondary | ICD-10-CM | POA: Diagnosis not present

## 2018-10-26 DIAGNOSIS — M960 Pseudarthrosis after fusion or arthrodesis: Secondary | ICD-10-CM | POA: Diagnosis not present

## 2018-10-27 DIAGNOSIS — R269 Unspecified abnormalities of gait and mobility: Secondary | ICD-10-CM | POA: Diagnosis not present

## 2018-11-02 DIAGNOSIS — G43909 Migraine, unspecified, not intractable, without status migrainosus: Secondary | ICD-10-CM | POA: Diagnosis not present

## 2018-11-02 DIAGNOSIS — Z9889 Other specified postprocedural states: Secondary | ICD-10-CM | POA: Diagnosis not present

## 2018-11-02 DIAGNOSIS — Z91038 Other insect allergy status: Secondary | ICD-10-CM | POA: Diagnosis not present

## 2018-11-02 DIAGNOSIS — K589 Irritable bowel syndrome without diarrhea: Secondary | ICD-10-CM | POA: Diagnosis not present

## 2018-11-17 DIAGNOSIS — M50322 Other cervical disc degeneration at C5-C6 level: Secondary | ICD-10-CM | POA: Diagnosis not present

## 2018-11-17 DIAGNOSIS — G8929 Other chronic pain: Secondary | ICD-10-CM | POA: Diagnosis not present

## 2018-11-17 DIAGNOSIS — M419 Scoliosis, unspecified: Secondary | ICD-10-CM | POA: Diagnosis not present

## 2018-11-17 DIAGNOSIS — M5136 Other intervertebral disc degeneration, lumbar region: Secondary | ICD-10-CM | POA: Diagnosis not present

## 2018-11-17 DIAGNOSIS — M5442 Lumbago with sciatica, left side: Secondary | ICD-10-CM | POA: Diagnosis not present

## 2018-11-17 DIAGNOSIS — M25562 Pain in left knee: Secondary | ICD-10-CM | POA: Diagnosis not present

## 2018-11-17 DIAGNOSIS — M47816 Spondylosis without myelopathy or radiculopathy, lumbar region: Secondary | ICD-10-CM | POA: Diagnosis not present

## 2018-11-17 DIAGNOSIS — M79671 Pain in right foot: Secondary | ICD-10-CM | POA: Diagnosis not present

## 2018-11-17 DIAGNOSIS — M5441 Lumbago with sciatica, right side: Secondary | ICD-10-CM | POA: Diagnosis not present

## 2018-11-17 DIAGNOSIS — Z9889 Other specified postprocedural states: Secondary | ICD-10-CM | POA: Diagnosis not present

## 2018-11-17 DIAGNOSIS — M545 Low back pain: Secondary | ICD-10-CM | POA: Diagnosis not present

## 2018-11-22 DIAGNOSIS — Y999 Unspecified external cause status: Secondary | ICD-10-CM | POA: Diagnosis not present

## 2018-11-22 DIAGNOSIS — S1093XA Contusion of unspecified part of neck, initial encounter: Secondary | ICD-10-CM | POA: Diagnosis not present

## 2018-11-22 DIAGNOSIS — Y92410 Unspecified street and highway as the place of occurrence of the external cause: Secondary | ICD-10-CM | POA: Diagnosis not present

## 2018-11-22 DIAGNOSIS — S299XXA Unspecified injury of thorax, initial encounter: Secondary | ICD-10-CM | POA: Diagnosis not present

## 2018-11-22 DIAGNOSIS — S32018A Other fracture of first lumbar vertebra, initial encounter for closed fracture: Secondary | ICD-10-CM | POA: Diagnosis not present

## 2018-11-22 DIAGNOSIS — M549 Dorsalgia, unspecified: Secondary | ICD-10-CM | POA: Diagnosis not present

## 2018-11-22 DIAGNOSIS — S32019A Unspecified fracture of first lumbar vertebra, initial encounter for closed fracture: Secondary | ICD-10-CM | POA: Diagnosis not present

## 2018-11-22 DIAGNOSIS — S161XXA Strain of muscle, fascia and tendon at neck level, initial encounter: Secondary | ICD-10-CM | POA: Diagnosis not present

## 2018-11-27 DIAGNOSIS — S32010A Wedge compression fracture of first lumbar vertebra, initial encounter for closed fracture: Secondary | ICD-10-CM | POA: Diagnosis not present

## 2018-12-06 DIAGNOSIS — M79671 Pain in right foot: Secondary | ICD-10-CM | POA: Diagnosis not present

## 2018-12-06 DIAGNOSIS — Z981 Arthrodesis status: Secondary | ICD-10-CM | POA: Diagnosis not present

## 2018-12-07 DIAGNOSIS — G8929 Other chronic pain: Secondary | ICD-10-CM | POA: Diagnosis not present

## 2018-12-07 DIAGNOSIS — M25511 Pain in right shoulder: Secondary | ICD-10-CM | POA: Diagnosis not present

## 2018-12-07 DIAGNOSIS — M25512 Pain in left shoulder: Secondary | ICD-10-CM | POA: Diagnosis not present

## 2018-12-13 DIAGNOSIS — H65192 Other acute nonsuppurative otitis media, left ear: Secondary | ICD-10-CM | POA: Diagnosis not present

## 2018-12-14 DIAGNOSIS — H6592 Unspecified nonsuppurative otitis media, left ear: Secondary | ICD-10-CM | POA: Diagnosis not present

## 2018-12-14 DIAGNOSIS — G43909 Migraine, unspecified, not intractable, without status migrainosus: Secondary | ICD-10-CM | POA: Diagnosis not present

## 2018-12-14 DIAGNOSIS — T7840XA Allergy, unspecified, initial encounter: Secondary | ICD-10-CM | POA: Diagnosis not present

## 2018-12-15 DIAGNOSIS — M542 Cervicalgia: Secondary | ICD-10-CM | POA: Diagnosis not present

## 2018-12-15 DIAGNOSIS — M25562 Pain in left knee: Secondary | ICD-10-CM | POA: Diagnosis not present

## 2018-12-15 DIAGNOSIS — M5442 Lumbago with sciatica, left side: Secondary | ICD-10-CM | POA: Diagnosis not present

## 2018-12-15 DIAGNOSIS — M79671 Pain in right foot: Secondary | ICD-10-CM | POA: Diagnosis not present

## 2018-12-15 DIAGNOSIS — M5441 Lumbago with sciatica, right side: Secondary | ICD-10-CM | POA: Diagnosis not present

## 2019-01-01 ENCOUNTER — Other Ambulatory Visit (INDEPENDENT_AMBULATORY_CARE_PROVIDER_SITE_OTHER): Payer: Self-pay | Admitting: Orthopedic Surgery

## 2019-01-01 MED ORDER — IBUPROFEN 800 MG PO TABS: 800.0000 mg | ORAL_TABLET | Freq: Three times a day (TID) | ORAL | 0 refills | Status: DC | PRN

## 2019-01-01 NOTE — Telephone Encounter (Signed)
rx written

## 2019-01-01 NOTE — Telephone Encounter (Signed)
Do you wish to refill? 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

## 2019-01-08 ENCOUNTER — Other Ambulatory Visit: Payer: Self-pay

## 2019-01-08 ENCOUNTER — Ambulatory Visit (INDEPENDENT_AMBULATORY_CARE_PROVIDER_SITE_OTHER): Payer: Medicare Other | Admitting: Orthopaedic Surgery

## 2019-01-08 ENCOUNTER — Encounter (INDEPENDENT_AMBULATORY_CARE_PROVIDER_SITE_OTHER): Payer: Self-pay | Admitting: Orthopaedic Surgery

## 2019-01-08 VITALS — BP 121/81 | HR 75 | Ht 64.0 in | Wt 160.0 lb

## 2019-01-08 DIAGNOSIS — M170 Bilateral primary osteoarthritis of knee: Secondary | ICD-10-CM | POA: Diagnosis not present

## 2019-01-08 MED ORDER — METHYLPREDNISOLONE ACETATE 40 MG/ML IJ SUSP
80.0000 mg | INTRAMUSCULAR | Status: AC | PRN
  Administered 2019-01-08: 80 mg via INTRA_ARTICULAR

## 2019-01-08 MED ORDER — BUPIVACAINE HCL 0.5 % IJ SOLN
2.0000 mL | INTRAMUSCULAR | Status: AC | PRN
  Administered 2019-01-08: 2 mL via INTRA_ARTICULAR

## 2019-01-08 MED ORDER — LIDOCAINE HCL 1 % IJ SOLN
2.0000 mL | INTRAMUSCULAR | Status: AC | PRN
  Administered 2019-01-08: 2 mL
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
## 2019-01-09 ENCOUNTER — Telehealth (INDEPENDENT_AMBULATORY_CARE_PROVIDER_SITE_OTHER): Payer: Self-pay | Admitting: Orthopaedic Surgery

## 2019-01-09 NOTE — Telephone Encounter (Signed)
Needs to wait several weeks for shoulder injections-please call

## 2019-01-09 NOTE — Telephone Encounter (Signed)
Please advise 

## 2019-01-09 NOTE — Telephone Encounter (Signed)
Called and spoke with patient. Advised her to wait at least 3-4 weeks before getting the shoulder injections.

## 2019-01-09 NOTE — Telephone Encounter (Signed)
Patient called stating she had bilateral cortisone injections in her knees yesterday.  Patient states she would also like to get cortisone injections in her shoulders.  Patient states the right shoulder is worse than the left, but requested a return call before scheduling the appointment since this is the same shoulder she had rotator cuff surgery on.

## 2019-01-22 DIAGNOSIS — L255 Unspecified contact dermatitis due to plants, except food: Secondary | ICD-10-CM | POA: Diagnosis not present

## 2019-01-29 ENCOUNTER — Ambulatory Visit (INDEPENDENT_AMBULATORY_CARE_PROVIDER_SITE_OTHER): Payer: Medicare Other | Admitting: Orthopaedic Surgery

## 2019-01-30 ENCOUNTER — Ambulatory Visit (INDEPENDENT_AMBULATORY_CARE_PROVIDER_SITE_OTHER): Payer: Medicare Other | Admitting: Orthopaedic Surgery

## 2019-01-30 ENCOUNTER — Encounter (INDEPENDENT_AMBULATORY_CARE_PROVIDER_SITE_OTHER): Payer: Self-pay | Admitting: Orthopaedic Surgery

## 2019-01-30 ENCOUNTER — Other Ambulatory Visit: Payer: Self-pay

## 2019-01-30 VITALS — BP 111/76 | HR 72 | Ht 64.0 in | Wt 158.0 lb

## 2019-01-30 DIAGNOSIS — M25512 Pain in left shoulder: Secondary | ICD-10-CM | POA: Diagnosis not present

## 2019-01-30 DIAGNOSIS — G8929 Other chronic pain: Secondary | ICD-10-CM | POA: Diagnosis not present

## 2019-01-30 DIAGNOSIS — M25511 Pain in right shoulder: Secondary | ICD-10-CM

## 2019-01-30 MED ORDER — METHYLPREDNISOLONE ACETATE 40 MG/ML IJ SUSP
80.0000 mg | INTRAMUSCULAR | Status: AC | PRN
  Administered 2019-01-30: 80 mg via INTRA_ARTICULAR

## 2019-01-30 MED ORDER — BUPIVACAINE HCL 0.5 % IJ SOLN
2.0000 mL | INTRAMUSCULAR | Status: AC | PRN
  Administered 2019-01-30: 2 mL via INTRA_ARTICULAR

## 2019-01-30 MED ORDER — LIDOCAINE HCL 2 % IJ SOLN
2.0000 mL | INTRAMUSCULAR | Status: AC | PRN
  Administered 2019-01-30: 2 mL

## 2019-01-30 NOTE — Progress Notes (Signed)
Office Visit Note   Patient: Raven Hansen           Date of Birth: [birth year]           MRN: [id number] Visit Date: 01/30/2019              Requested by: Raven Hansen, [place], [place] [id number] PCP: Raven Robinson, PA-C   Assessment & Plan: Visit Diagnoses:  1. Chronic pain of both shoulders     Plan: Recurrent impingement syndrome right shoulder.  Long discussion with Raven Hansen regarding her recurrent pain and need to define the problem.  Will order MRI scan of right shoulder when available.  We will also inject the subacromial space right shoulder with Depo-Medrol  Follow-Up Instructions: No follow-ups on file.   Orders:  Orders Placed This Encounter  Procedures  . Large Joint Inj: R subacromial bursa   No orders of the defined types were placed in this encounter.     Procedures: Large Joint Inj: R subacromial bursa on 01/30/2019 10:19 AM Indications: pain and diagnostic evaluation Details: 25 G 1.5 in needle, anterolateral approach  Arthrogram: No  Medications: 2 mL lidocaine 2 %; 2 mL bupivacaine 0.5 %; 80 mg methylPREDNISolone acetate 40 MG/ML Consent was given by the patient. Immediately prior to procedure a time out was called to verify the correct patient, procedure, equipment, support staff and site/side marked as required. Patient was prepped and draped in the usual sterile fashion.       Clinical Data: No additional findings.   Subjective: Chief Complaint  Patient presents with  . Right Shoulder - Pain  . Left Shoulder - Pain  Patient presents today for bilateral shoulder pain. She was last here on 09/14/18 and received bilateral cortisone injections. Patient states that she is wanting bilateral injections again today. She is in pain management for her pain. Chronic problem with both shoulders dating back many years ago when she was living in [place].  She has had prior arthroscopic procedure but does  not have a copy of her operative note.  She notes that since she had the surgery over 30 years ago she still had some pain.  I injected both subacromial spaces in November.  She visited 1 of the [facility] orthopedic offices in February and also received subacromial cortisone injections which "did not work".  She thinks that the [place] does not work as well as the Depo-Medrol.  She is not had any recurrent injury or trauma.  She is having trouble with overhead motion and sleep. HPI  Review of Systems   Objective: Vital Signs: BP 111/76   Pulse 72   Ht 5\' 4"  (1.626 m)   Wt 158 lb (71.7 kg)   BMI 27.12 kg/m   Physical Exam Constitutional:      Appearance: She is well-developed.  Eyes:     Pupils: Pupils are equal, round, and reactive to light.  Pulmonary:     Effort: Pulmonary effort is normal.  Skin:    General: Skin is warm and dry.  Neurological:     Mental Status: She is alert and oriented to person, place, and time.  Psychiatric:        Behavior: Behavior normal.     Ortho Exam awake alert and oriented x3.  Comfortable sitting intact right shoulder.  Prior arthroscopic portals intact.  Full overhead motion and able to scratch the middle of her back.  Mildly positive impingement on the extreme of internal  and external rotation.  Biceps intact.  Good grip and good release.  No pain with range of motion of cervical spine referable to her right shoulder  Specialty Comments:  No specialty comments available.  Imaging: No results found.   PMFS History: Patient Active Problem List   Diagnosis Date Noted  . Chronic pain of both shoulders 01/30/2019  . Bilateral primary osteoarthritis of knee 01/08/2019  . Exposure to communicable disease 05/09/2018  . Flying phobia 05/09/2018  . H/O bee sting allergy 05/09/2018  . Mixed hyperlipidemia 05/09/2018  . Allergic contact dermatitis 04/14/2018  . Preop general physical exam 12/14/2017  . Mass of right hand 07/14/2017  . Cat  scratch of forearm, right, initial encounter 06/30/2017  . Family history of breast cancer 06/22/2017  . History of ovarian cancer 06/22/2017  . Bipolar affective disorder in remission ([place]) 04/13/2017  . Allergic dermatitis due to poison ivy 03/10/2017  . Myofascial pain 03/10/2017  . Acute bronchitis due to Mycoplasma pneumoniae 02/17/2017  . Anemia 02/17/2017  . Cervical radiculopathy 02/17/2017  . Chronic pain syndrome 02/17/2017  . Complete tear of anterior cruciate ligament of knee 02/17/2017  . Dental infection 02/17/2017  . Epidermal cyst 02/17/2017  . Failed back surgical syndrome 02/17/2017  . Hypoglycemia 02/17/2017  . Lesion of nipple 02/17/2017  . Osteoarthrosis of knee 02/17/2017  . Petechiae 02/17/2017  . Rotator cuff impingement syndrome of right shoulder 02/17/2017  . Seizures ([place]) 02/17/2017  . Anxiety and depression 01/19/2017  . Cervical disc disease 01/19/2017  . Chronic bronchitis ([place]) 01/19/2017  . Contusion of right great toe without damage to nail 01/19/2017  . Migraine aura, persistent, intractable 01/19/2017  . Viral upper respiratory tract infection 01/19/2017  . Chronic diarrhea 01/14/2017  . Bipolar 2 disorder, major depressive episode ([place]) 08/26/2016  . Tobacco use disorder, severe, dependence 08/26/2016  . Benzodiazepine dependence ([place]) 08/25/2016  . Pain in right knee 07/02/2016  . Dry eye syndrome 06/17/2016  . Episode of recurrent major depressive disorder ([place]) 06/17/2016  . Essential hypertension 06/17/2016  . GERD without esophagitis 06/17/2016  . Hepatitis C 06/17/2016  . Herniated cervical disc 06/17/2016  . Herpes simplex vulvovaginitis 06/17/2016  . Hypothyroidism (acquired) 06/17/2016  . Insomnia 06/17/2016  . Malaise and fatigue 06/17/2016  . Polycystic kidney disease 06/17/2016  . Primary osteoarthritis involving multiple joints 06/17/2016  . Allergic rhinitis 08/07/2014  . Anxiety state 08/07/2014  . Cough 08/07/2014  .  Other amnesia 08/07/2014  . Other fatigue 08/07/2014  . Severe episode of recurrent major depressive disorder, with psychotic features ([place]) 08/07/2014  . Tobacco use 08/07/2014   Past Medical History:  Diagnosis Date  . Acid reflux   . Colitis   . Hypertension   . Thyroid disease     History reviewed. No pertinent family history.  Past Surgical History:  Procedure Laterality Date  . CHOLECYSTECTOMY    . FOOT SURGERY    . KNEE ARTHROSCOPY    . NECK SURGERY    . SHOULDER ARTHROSCOPY    . VAGINAL HYSTERECTOMY     Social History   Occupational History  . Not on file  Tobacco Use  . Smoking status: Current Every Day Smoker    Packs/day: 0.50    Years: 37.00    Pack years: 18.50    Types: Cigarettes  . Smokeless tobacco: Never Used  Substance and Sexual Activity  . Alcohol use: Yes    Comment: RARELY  . Drug use: Never  . Sexual activity:  Not on file

## 2019-01-30 NOTE — Addendum Note (Signed)
Addended by: Lendon Collar on: 01/30/2019 10:39 AM   Modules accepted: Orders

## 2019-02-02 ENCOUNTER — Ambulatory Visit (INDEPENDENT_AMBULATORY_CARE_PROVIDER_SITE_OTHER): Payer: Medicare Other | Admitting: Orthopaedic Surgery

## 2019-02-16 DIAGNOSIS — N281 Cyst of kidney, acquired: Secondary | ICD-10-CM | POA: Diagnosis not present

## 2019-02-16 DIAGNOSIS — R1030 Lower abdominal pain, unspecified: Secondary | ICD-10-CM | POA: Diagnosis not present

## 2019-02-16 DIAGNOSIS — M4856XD Collapsed vertebra, not elsewhere classified, lumbar region, subsequent encounter for fracture with routine healing: Secondary | ICD-10-CM | POA: Diagnosis not present

## 2019-02-16 DIAGNOSIS — R1031 Right lower quadrant pain: Secondary | ICD-10-CM | POA: Diagnosis not present

## 2019-02-20 DIAGNOSIS — M25562 Pain in left knee: Secondary | ICD-10-CM | POA: Diagnosis not present

## 2019-02-20 DIAGNOSIS — M5441 Lumbago with sciatica, right side: Secondary | ICD-10-CM | POA: Diagnosis not present

## 2019-02-20 DIAGNOSIS — M25511 Pain in right shoulder: Secondary | ICD-10-CM | POA: Diagnosis not present

## 2019-02-20 DIAGNOSIS — M542 Cervicalgia: Secondary | ICD-10-CM | POA: Diagnosis not present

## 2019-02-20 DIAGNOSIS — M5442 Lumbago with sciatica, left side: Secondary | ICD-10-CM | POA: Diagnosis not present

## 2019-03-05 DIAGNOSIS — E119 Type 2 diabetes mellitus without complications: Secondary | ICD-10-CM | POA: Diagnosis not present

## 2019-03-09 DIAGNOSIS — M542 Cervicalgia: Secondary | ICD-10-CM | POA: Diagnosis not present

## 2019-03-09 DIAGNOSIS — M79671 Pain in right foot: Secondary | ICD-10-CM | POA: Diagnosis not present

## 2019-03-09 DIAGNOSIS — M5441 Lumbago with sciatica, right side: Secondary | ICD-10-CM | POA: Diagnosis not present

## 2019-03-09 DIAGNOSIS — M25562 Pain in left knee: Secondary | ICD-10-CM | POA: Diagnosis not present

## 2019-03-09 DIAGNOSIS — M5442 Lumbago with sciatica, left side: Secondary | ICD-10-CM | POA: Diagnosis not present

## 2019-03-15 DIAGNOSIS — L255 Unspecified contact dermatitis due to plants, except food: Secondary | ICD-10-CM | POA: Diagnosis not present

## 2019-03-21 ENCOUNTER — Telehealth: Payer: Self-pay | Admitting: Orthopaedic Surgery

## 2019-03-21 DIAGNOSIS — L255 Unspecified contact dermatitis due to plants, except food: Secondary | ICD-10-CM | POA: Diagnosis not present

## 2019-03-21 NOTE — Telephone Encounter (Signed)
Please advise 

## 2019-03-21 NOTE — Telephone Encounter (Signed)
Patient called stating she doesn't think it is safe to get an MRI at this time due to [place].  Patient states she is scheduled for a shoulder injection on 05/02/19 and wants to make sure Dr. Durward Fortes will still give her the injection if she doesn't have the MRI.  Patient requested a return call.

## 2019-03-21 NOTE — Telephone Encounter (Signed)
Ok for injection without MRI

## 2019-03-21 NOTE — Telephone Encounter (Signed)
LMOM

## 2019-04-09 DIAGNOSIS — M79671 Pain in right foot: Secondary | ICD-10-CM | POA: Diagnosis not present

## 2019-04-09 DIAGNOSIS — Z981 Arthrodesis status: Secondary | ICD-10-CM | POA: Diagnosis not present

## 2019-04-12 ENCOUNTER — Ambulatory Visit (INDEPENDENT_AMBULATORY_CARE_PROVIDER_SITE_OTHER): Payer: Medicare Other | Admitting: Orthopaedic Surgery

## 2019-04-12 ENCOUNTER — Other Ambulatory Visit: Payer: Self-pay

## 2019-04-12 ENCOUNTER — Encounter: Payer: Self-pay | Admitting: Orthopaedic Surgery

## 2019-04-12 VITALS — BP 110/84 | HR 73 | Ht 64.0 in | Wt 158.0 lb

## 2019-04-12 DIAGNOSIS — M11262 Other chondrocalcinosis, left knee: Secondary | ICD-10-CM

## 2019-04-12 DIAGNOSIS — M11261 Other chondrocalcinosis, right knee: Secondary | ICD-10-CM | POA: Insufficient documentation

## 2019-04-12 MED ORDER — BUPIVACAINE HCL 0.25 % IJ SOLN
2.0000 mL | INTRAMUSCULAR | Status: AC | PRN
  Administered 2019-04-12: 2 mL via INTRA_ARTICULAR

## 2019-04-12 MED ORDER — METHYLPREDNISOLONE ACETATE 40 MG/ML IJ SUSP
80.0000 mg | INTRAMUSCULAR | Status: AC | PRN
  Administered 2019-04-12: 80 mg via INTRA_ARTICULAR

## 2019-04-12 MED ORDER — LIDOCAINE HCL 1 % IJ SOLN
2.0000 mL | INTRAMUSCULAR | Status: AC | PRN
  Administered 2019-04-12: 2 mL

## 2019-04-12 MED ORDER — BUPIVACAINE HCL 0.5 % IJ SOLN
2.0000 mL | INTRAMUSCULAR | Status: AC | PRN
  Administered 2019-04-12: 2 mL via INTRA_ARTICULAR

## 2019-04-12 NOTE — Progress Notes (Signed)
Office Visit Note   Patient: Raven Hansen           Date of Birth: [birth year]           MRN: [id number] Visit Date: 04/12/2019              Requested by: Janeice Robinson, [place],  [place] [address] PCP: Janeice Robinson, PA-C   Assessment & Plan: Visit Diagnoses:  1. Chondrocalcinosis due to dicalcium phosphate crystals, of the knee, left   2. Chondrocalcinosis due to dicalcium phosphate crystals, of the knee, right     Plan: #1: Corticosteroid injection was given to both knees atraumatically. #2 follow back up as needed #3: If she calls for referral for her neck and back she can set up an appointment for Dr. Louanne Skye.  Follow-Up Instructions: No follow-ups on file.   Face-to-face time spent with patient was greater than 30 minutes.  Greater than 50% of the time was spent in counseling and coordination of care.  Orders:  No orders of the defined types were placed in this encounter.  No orders of the defined types were placed in this encounter.     Procedures: Large Joint Inj: bilateral knee on 04/12/2019 2:10 PM Indications: diagnostic evaluation Details: 25 G 1.5 in needle, anteromedial approach  Arthrogram: No  Medications (Right): 2 mL lidocaine 1 %; 80 mg methylPREDNISolone acetate 40 MG/ML; 2 mL bupivacaine 0.25 % Medications (Left): 2 mL lidocaine 1 %; 2 mL bupivacaine 0.5 %; 80 mg methylPREDNISolone acetate 40 MG/ML; 2 mL bupivacaine 0.25 % Procedure, treatment alternatives, risks and benefits explained, specific risks discussed. Consent was given by the patient. Immediately prior to procedure a time out was called to verify the correct patient, procedure, equipment, support staff and site/side marked as required. Patient was prepped and draped in the usual sterile fashion.       Clinical Data: No additional findings.   Subjective: Chief Complaint  Patient presents with  . Left Knee - Pain  . Right Knee - Pain    HPI:  Patient presents today for recurrent knee pain. She was last here three months ago and received cortisone injections. She said that they are helpful and have started hurting again. She wants more injections today.   Raven Hansen is a 58 year old white female who is seen today for evaluation of her knees.  She had major left knee reconstruction back around 58 years ago.  She has had problems with both knees.  She was last seen approximate 3 months ago and she is noted to have chondrocalcinosis.  She states cortisone injections have been beneficial intermittently.  However her pain is returned.  Seen today.    Review of Systems  Constitutional: Negative for fatigue.  HENT: Negative for trouble swallowing.   Eyes: Negative for pain.  Respiratory: Negative for shortness of breath.   Cardiovascular: Negative for leg swelling.  Gastrointestinal: Positive for diarrhea.  Endocrine: Positive for cold intolerance.  Genitourinary: Negative for difficulty urinating.  Musculoskeletal: Positive for joint swelling, neck pain and neck stiffness.  Skin: Negative for rash.  Allergic/Immunologic: Negative for food allergies.  Neurological: Positive for weakness and numbness.  Hematological: Does not bruise/bleed easily.  Psychiatric/Behavioral: Positive for sleep disturbance.     Objective: Vital Signs: BP 110/84   Pulse 73   Ht 5\' 4"  (1.626 m)   Wt 158 lb (71.7 kg)   BMI 27.12 kg/m   Physical Exam Constitutional:      Appearance: Normal appearance.  She is well-developed and normal weight.  HENT:     Head: Normocephalic.  Eyes:     Pupils: Pupils are equal, round, and reactive to light.  Pulmonary:     Effort: Pulmonary effort is normal.  Skin:    General: Skin is warm and dry.  Neurological:     Mental Status: She is alert and oriented to person, place, and time.  Psychiatric:        Behavior: Behavior normal.     Ortho Exam  Exam today of the left knee reveals medial and lateral long  curvilinear scars.  She does not really have any effusion.  Mild anterior drawer and Lockman's on the left.  Pretty stable with varus and valgus stressing.  Ranges from full extension to 100 degrees to 100 degrees. Right knee reveals range of motion 0 to 110 degrees.  No effusion.  No incisions noted.  Does have a little bit of crepitance with range of motion more patellofemoral.  Specialty Comments:  No specialty comments available.  Imaging: No results found.   PMFS History: Current Outpatient Medications  Medication Sig Dispense Refill  . albuterol (VENTOLIN HFA) 108 (90 Base) MCG/ACT inhaler inhale 2 puffs by mouth every 4 hours for wheezing    . atenolol (TENORMIN) 50 MG tablet Take 50 mg by mouth daily.  0  . cephALEXin (KEFLEX) 500 MG capsule Take 500 mg by mouth 4 (four) times daily.    . cholestyramine (QUESTRAN) 4 g packet MIX 1 PACKET as directed and take by mouth four times a day    . fluticasone (FLONASE) 50 MCG/ACT nasal spray INSTILL 1 SPRAY INTO EACH NOSTRIL TWICE A DAY  2  . gabapentin (NEURONTIN) 300 MG capsule Take 1 capsule (300 mg total) by mouth 3 (three) times daily. 3 times a day when necessary neuropathy pain 90 capsule 3  . gabapentin (NEURONTIN) 600 MG tablet Take 300 mg by mouth.    Marland Kitchen HYDROcodone-acetaminophen (NORCO/VICODIN) 5-325 MG tablet     . ibuprofen (ADVIL,MOTRIN) 800 MG tablet take 1 tablet by mouth three times a day if needed for pain    . ibuprofen (ADVIL,MOTRIN) 800 MG tablet TAKE 1 TABLET(800 MG) BY MOUTH EVERY 8 HOURS AS NEEDED 30 tablet 0  . ibuprofen (ADVIL,MOTRIN) 800 MG tablet Take 1 tablet (800 mg total) by mouth every 8 (eight) hours as needed. 30 tablet 0  . lisinopril-hydrochlorothiazide (PRINZIDE,ZESTORETIC) 20-25 MG tablet TK 1 T PO QD  8  . oxyCODONE-acetaminophen (PERCOCET/ROXICET) 5-325 MG tablet Take 1 tablet by mouth every 8 (eight) hours as needed. 10 tablet 0  . SUMAtriptan (IMITREX) 100 MG tablet Take 100 mg by mouth.    .  SYNTHROID 125 MCG tablet TK 1 T PO DAILY 20 TO 30 MINUTES BEFORE FIRST MEAL  2  . valACYclovir (VALTREX) 1000 MG tablet Take 1,000 mg by mouth.     No current facility-administered medications for this visit.     Patient Active Problem List   Diagnosis Date Noted  . Chondrocalcinosis due to dicalcium phosphate crystals, of the knee, right 04/12/2019  . Chronic pain of both shoulders 01/30/2019  . Bilateral primary osteoarthritis of knee 01/08/2019  . Exposure to communicable disease 05/09/2018  . Flying phobia 05/09/2018  . H/O bee sting allergy 05/09/2018  . Mixed hyperlipidemia 05/09/2018  . Allergic contact dermatitis 04/14/2018  . Preop general physical exam 12/14/2017  . Mass of right hand 07/14/2017  . Cat scratch of forearm, right, initial encounter 06/30/2017  .  Family history of breast cancer 06/22/2017  . History of ovarian cancer 06/22/2017  . Bipolar affective disorder in remission ([place]) 04/13/2017  . Allergic dermatitis due to poison ivy 03/10/2017  . Myofascial pain 03/10/2017  . Acute bronchitis due to Mycoplasma pneumoniae 02/17/2017  . Anemia 02/17/2017  . Cervical radiculopathy 02/17/2017  . Chronic pain syndrome 02/17/2017  . Complete tear of anterior cruciate ligament of knee 02/17/2017  . Dental infection 02/17/2017  . Epidermal cyst 02/17/2017  . Failed back surgical syndrome 02/17/2017  . Hypoglycemia 02/17/2017  . Lesion of nipple 02/17/2017  . DJD (degenerative joint disease) of knee 02/17/2017  . Petechiae 02/17/2017  . Rotator cuff impingement syndrome of right shoulder 02/17/2017  . Seizures ([place]) 02/17/2017  . Anxiety and depression 01/19/2017  . Cervical disc disease 01/19/2017  . Chronic bronchitis ([place]) 01/19/2017  . Contusion of right great toe without damage to nail 01/19/2017  . Migraine aura, persistent, intractable 01/19/2017  . Viral upper respiratory tract infection 01/19/2017  . Chronic diarrhea 01/14/2017  . Bipolar 2 disorder,  major depressive episode ([place]) 08/26/2016  . Tobacco use disorder, severe, dependence 08/26/2016  . Benzodiazepine dependence ([place]) 08/25/2016  . Pain in right knee 07/02/2016  . Dry eye syndrome 06/17/2016  . Episode of recurrent major depressive disorder ([place]) 06/17/2016  . Essential hypertension 06/17/2016  . GERD without esophagitis 06/17/2016  . Hepatitis C 06/17/2016  . Herniated cervical disc 06/17/2016  . Herpes simplex vulvovaginitis 06/17/2016  . Hypothyroidism (acquired) 06/17/2016  . Insomnia 06/17/2016  . Malaise and fatigue 06/17/2016  . Polycystic kidney disease 06/17/2016  . Primary osteoarthritis involving multiple joints 06/17/2016  . Allergic rhinitis 08/07/2014  . Anxiety state 08/07/2014  . Cough 08/07/2014  . Other amnesia 08/07/2014  . Other fatigue 08/07/2014  . Severe episode of recurrent major depressive disorder, with psychotic features ([place]) 08/07/2014  . Tobacco use 08/07/2014   Past Medical History:  Diagnosis Date  . Acid reflux   . Colitis   . Hypertension   . Thyroid disease     History reviewed. No pertinent family history.  Past Surgical History:  Procedure Laterality Date  . CHOLECYSTECTOMY    . FOOT SURGERY    . KNEE ARTHROSCOPY    . NECK SURGERY    . SHOULDER ARTHROSCOPY    . VAGINAL HYSTERECTOMY     Social History   Occupational History  . Not on file  Tobacco Use  . Smoking status: Current Every Day Smoker    Packs/day: 0.50    Years: 37.00    Pack years: 18.50    Types: Cigarettes  . Smokeless tobacco: Never Used  Substance and Sexual Activity  . Alcohol use: Yes    Comment: RARELY  . Drug use: Never  . Sexual activity: Not on file

## 2019-04-13 ENCOUNTER — Ambulatory Visit: Payer: Self-pay | Admitting: Orthopaedic Surgery

## 2019-04-19 DIAGNOSIS — I10 Essential (primary) hypertension: Secondary | ICD-10-CM | POA: Diagnosis not present

## 2019-04-19 DIAGNOSIS — Z131 Encounter for screening for diabetes mellitus: Secondary | ICD-10-CM | POA: Diagnosis not present

## 2019-04-19 DIAGNOSIS — Z13220 Encounter for screening for lipoid disorders: Secondary | ICD-10-CM | POA: Diagnosis not present

## 2019-04-19 DIAGNOSIS — E039 Hypothyroidism, unspecified: Secondary | ICD-10-CM | POA: Diagnosis not present

## 2019-04-23 ENCOUNTER — Telehealth: Payer: Self-pay | Admitting: Orthopaedic Surgery

## 2019-04-23 NOTE — Telephone Encounter (Signed)
Patient left a voicemail requesting a return call to discuss MRI of neck and back.

## 2019-04-24 NOTE — Telephone Encounter (Signed)
Do you want to discuss over phone?

## 2019-04-24 NOTE — Telephone Encounter (Signed)
Please call.

## 2019-04-26 DIAGNOSIS — M5442 Lumbago with sciatica, left side: Secondary | ICD-10-CM | POA: Diagnosis not present

## 2019-04-26 DIAGNOSIS — M5441 Lumbago with sciatica, right side: Secondary | ICD-10-CM | POA: Diagnosis not present

## 2019-04-26 DIAGNOSIS — Z79891 Long term (current) use of opiate analgesic: Secondary | ICD-10-CM | POA: Diagnosis not present

## 2019-04-26 DIAGNOSIS — N183 Chronic kidney disease, stage 3 (moderate): Secondary | ICD-10-CM | POA: Diagnosis not present

## 2019-04-26 DIAGNOSIS — E039 Hypothyroidism, unspecified: Secondary | ICD-10-CM | POA: Diagnosis not present

## 2019-04-26 DIAGNOSIS — I10 Essential (primary) hypertension: Secondary | ICD-10-CM | POA: Diagnosis not present

## 2019-04-26 DIAGNOSIS — G8929 Other chronic pain: Secondary | ICD-10-CM | POA: Diagnosis not present

## 2019-04-26 DIAGNOSIS — M5412 Radiculopathy, cervical region: Secondary | ICD-10-CM | POA: Diagnosis not present

## 2019-05-01 ENCOUNTER — Other Ambulatory Visit (INDEPENDENT_AMBULATORY_CARE_PROVIDER_SITE_OTHER): Payer: Self-pay | Admitting: Orthopedic Surgery

## 2019-05-01 NOTE — Telephone Encounter (Signed)
This is a PW pt

## 2019-05-01 NOTE — Telephone Encounter (Signed)
Please advise 

## 2019-05-02 ENCOUNTER — Ambulatory Visit: Payer: Self-pay | Admitting: Orthopaedic Surgery

## 2019-05-02 NOTE — Telephone Encounter (Signed)
Ok to refill 

## 2019-05-03 ENCOUNTER — Other Ambulatory Visit: Payer: Self-pay

## 2019-05-03 ENCOUNTER — Ambulatory Visit (INDEPENDENT_AMBULATORY_CARE_PROVIDER_SITE_OTHER): Payer: Medicare Other | Admitting: Orthopaedic Surgery

## 2019-05-03 ENCOUNTER — Encounter: Payer: Self-pay | Admitting: Orthopaedic Surgery

## 2019-05-03 VITALS — BP 109/77 | HR 77 | Resp 18 | Ht 64.0 in | Wt 158.6 lb

## 2019-05-03 DIAGNOSIS — M7541 Impingement syndrome of right shoulder: Secondary | ICD-10-CM | POA: Diagnosis not present

## 2019-05-03 NOTE — Telephone Encounter (Signed)
thanks

## 2019-05-03 NOTE — Progress Notes (Signed)
Office Visit Note   Patient: Raven Hansen           Date of Birth: [birth year]           MRN: [id number] Visit Date: 05/03/2019              Requested by: Raven Hansen, Raven Hansen,  Raven Hansen [id number] PCP: Raven Robinson, PA-C   Assessment & Plan: Visit Diagnoses:  1. Rotator cuff impingement syndrome of right shoulder     Plan: Impingement syndrome right shoulder.  Will reinject  subacromial space with cortisone.  Long discussion regarding diagnosis and multiple other joint complaints.  Office visit over 30 minutes reading all of her records and discussing her diagnosis and treatment options over time.  She is not interested in any surgery or more physical therapy  Follow-Up Instructions: Return if symptoms worsen or fail to improve.   Orders:  No orders of the defined types were placed in this encounter.  No orders of the defined types were placed in this encounter.     Procedures: No procedures performed   Clinical Data: No additional findings.   Subjective: Chief Complaint  Patient presents with  . Right Shoulder - Pain  . Left Shoulder - Pain   Raven Hansen is a 58 year old female who presents with bilateral shoulder pain over 15 years. She did not have an injury. She is having weakness, difficulty sleeping at night and limited range of motion.  Raven Hansen has some old records from her treatments relative to the right shoulder in [place] as far back as 2003.  I reviewed these in detail with her.  She had an MRI scan in 2017 that was consistent with arthritis of the right [facility] joint and tendinopathy of the infra and supraspinatus.  She has had subacromial cortisone injections in multiple locations i.e. [place] and here with excellent relief on each occasion.  She has had multiple physical therapy sessions and has tried a number of over-the-counter medicines but with persistent pain in her shoulder.  She has had prior  cervical spine surgery from prior accidents and more recently fusion of the right great toe at Raven [facility] HPI  Review of Systems   Objective: Vital Signs: BP 109/77 (BP Location: Right Arm, Patient Position: Sitting, Cuff Size: Normal)   Pulse 77   Resp 18   Ht 5\' 4"  (1.626 m)   Wt 158 lb 9.6 oz (71.9 kg)   BMI 27.22 kg/m   Physical Exam Constitutional:      Appearance: She is well-developed.  Eyes:     Pupils: Pupils are equal, round, and reactive to light.  Pulmonary:     Effort: Pulmonary effort is normal.  Skin:    General: Skin is warm and dry.  Neurological:     Mental Status: She is alert and oriented to person, place, and time.  Psychiatric:        Behavior: Behavior normal.     Ortho Exam right shoulder with positive impingement and minimally positive empty can test.  Biceps intact.  No grating or crepitation.  Some pain at the [facility] joint and the anterior subacromial region.  Has some difficulty scratching the middle of her back but could not touch the lumbosacral junction.  Can raise her arm over her head with some mild discomfort and circuitous motion.  Has limited motion of her cervical spine in flexion extension related to her old fusion  Specialty Comments:  No specialty comments  available.  Imaging: No results found.   PMFS History: Patient Active Problem List   Diagnosis Date Noted  . Chondrocalcinosis due to dicalcium phosphate crystals, of the knee, right 04/12/2019  . Chronic pain of both shoulders 01/30/2019  . Bilateral primary osteoarthritis of knee 01/08/2019  . Exposure to communicable disease 05/09/2018  . Flying phobia 05/09/2018  . H/O bee sting allergy 05/09/2018  . Mixed hyperlipidemia 05/09/2018  . Allergic contact dermatitis 04/14/2018  . Preop general physical exam 12/14/2017  . Mass of right hand 07/14/2017  . Cat scratch of forearm, right, initial encounter 06/30/2017  . Family history of breast cancer 06/22/2017  . History of ovarian  cancer 06/22/2017  . Bipolar affective disorder in remission ([place]) 04/13/2017  . Allergic dermatitis due to poison ivy 03/10/2017  . Myofascial pain 03/10/2017  . Acute bronchitis due to Mycoplasma pneumoniae 02/17/2017  . Anemia 02/17/2017  . Cervical radiculopathy 02/17/2017  . Chronic pain syndrome 02/17/2017  . Complete tear of anterior cruciate ligament of knee 02/17/2017  . Dental infection 02/17/2017  . Epidermal cyst 02/17/2017  . Failed back surgical syndrome 02/17/2017  . Hypoglycemia 02/17/2017  . Lesion of nipple 02/17/2017  . DJD (degenerative joint disease) of knee 02/17/2017  . Petechiae 02/17/2017  . Rotator cuff impingement syndrome of right shoulder 02/17/2017  . Seizures ([place]) 02/17/2017  . Anxiety and depression 01/19/2017  . Cervical disc disease 01/19/2017  . Chronic bronchitis ([place]) 01/19/2017  . Contusion of right great toe without damage to nail 01/19/2017  . Migraine aura, persistent, intractable 01/19/2017  . Viral upper respiratory tract infection 01/19/2017  . Chronic diarrhea 01/14/2017  . Bipolar 2 disorder, major depressive episode ([place]) 08/26/2016  . Tobacco use disorder, severe, dependence 08/26/2016  . Benzodiazepine dependence ([place]) 08/25/2016  . Pain in right knee 07/02/2016  . Dry eye syndrome 06/17/2016  . Episode of recurrent major depressive disorder ([place]) 06/17/2016  . Essential hypertension 06/17/2016  . GERD without esophagitis 06/17/2016  . Hepatitis C 06/17/2016  . Herniated cervical disc 06/17/2016  . Herpes simplex vulvovaginitis 06/17/2016  . Hypothyroidism (acquired) 06/17/2016  . Insomnia 06/17/2016  . Malaise and fatigue 06/17/2016  . Polycystic kidney disease 06/17/2016  . Primary osteoarthritis involving multiple joints 06/17/2016  . Allergic rhinitis 08/07/2014  . Anxiety state 08/07/2014  . Cough 08/07/2014  . Other amnesia 08/07/2014  . Other fatigue 08/07/2014  . Severe episode of recurrent major depressive  disorder, with psychotic features ([place]) 08/07/2014  . Tobacco use 08/07/2014   Past Medical History:  Diagnosis Date  . Acid reflux   . Colitis   . Hypertension   . Thyroid disease     History reviewed. No pertinent family history.  Past Surgical History:  Procedure Laterality Date  . CHOLECYSTECTOMY    . FOOT SURGERY    . KNEE ARTHROSCOPY    . NECK SURGERY    . SHOULDER ARTHROSCOPY    . VAGINAL HYSTERECTOMY     Social History   Occupational History  . Not on file  Tobacco Use  . Smoking status: Current Every Day Smoker    Packs/day: 0.25    Years: 37.00    Pack years: 9.25    Types: Cigarettes  . Smokeless tobacco: Never Used  Substance and Sexual Activity  . Alcohol use: Yes    Comment: RARELY  . Drug use: Never  . Sexual activity: Not on file

## 2019-05-04 ENCOUNTER — Ambulatory Visit: Payer: Medicare Other | Admitting: Orthopaedic Surgery

## 2019-05-07 ENCOUNTER — Other Ambulatory Visit: Payer: Self-pay

## 2019-05-07 ENCOUNTER — Other Ambulatory Visit: Payer: Self-pay | Admitting: Physician Assistant

## 2019-05-07 DIAGNOSIS — M5441 Lumbago with sciatica, right side: Secondary | ICD-10-CM

## 2019-05-07 DIAGNOSIS — G8929 Other chronic pain: Secondary | ICD-10-CM

## 2019-05-07 DIAGNOSIS — M5442 Lumbago with sciatica, left side: Secondary | ICD-10-CM

## 2019-05-07 DIAGNOSIS — M545 Low back pain, unspecified: Secondary | ICD-10-CM

## 2019-05-12 ENCOUNTER — Other Ambulatory Visit: Payer: Self-pay | Admitting: Physician Assistant

## 2019-05-12 DIAGNOSIS — M542 Cervicalgia: Secondary | ICD-10-CM

## 2019-05-15 DIAGNOSIS — M50222 Other cervical disc displacement at C5-C6 level: Secondary | ICD-10-CM | POA: Diagnosis not present

## 2019-05-15 DIAGNOSIS — Z9889 Other specified postprocedural states: Secondary | ICD-10-CM | POA: Diagnosis not present

## 2019-05-15 DIAGNOSIS — Z981 Arthrodesis status: Secondary | ICD-10-CM | POA: Diagnosis not present

## 2019-05-15 DIAGNOSIS — M4802 Spinal stenosis, cervical region: Secondary | ICD-10-CM | POA: Diagnosis not present

## 2019-05-18 ENCOUNTER — Other Ambulatory Visit (INDEPENDENT_AMBULATORY_CARE_PROVIDER_SITE_OTHER): Payer: Self-pay | Admitting: Orthopedic Surgery

## 2019-05-18 ENCOUNTER — Other Ambulatory Visit (INDEPENDENT_AMBULATORY_CARE_PROVIDER_SITE_OTHER): Payer: Self-pay | Admitting: Orthopaedic Surgery

## 2019-05-18 NOTE — Telephone Encounter (Signed)
Ok to prescribe

## 2019-05-18 NOTE — Telephone Encounter (Signed)
Please advise 

## 2019-05-21 ENCOUNTER — Ambulatory Visit
Admission: RE | Admit: 2019-05-21 | Discharge: 2019-05-21 | Disposition: A | Discharge status: Home / Self Care | Payer: Medicare Other | Source: Ambulatory Visit | Attending: Physician Assistant | Admitting: Physician Assistant

## 2019-05-21 DIAGNOSIS — M48061 Spinal stenosis, lumbar region without neurogenic claudication: Secondary | ICD-10-CM | POA: Diagnosis not present

## 2019-05-21 DIAGNOSIS — M5442 Lumbago with sciatica, left side: Secondary | ICD-10-CM

## 2019-05-21 DIAGNOSIS — M545 Low back pain, unspecified: Secondary | ICD-10-CM

## 2019-05-21 DIAGNOSIS — G8929 Other chronic pain: Secondary | ICD-10-CM

## 2019-05-21 DIAGNOSIS — M5441 Lumbago with sciatica, right side: Secondary | ICD-10-CM

## 2019-05-31 DIAGNOSIS — S32010S Wedge compression fracture of first lumbar vertebra, sequela: Secondary | ICD-10-CM | POA: Diagnosis not present

## 2019-05-31 DIAGNOSIS — M50222 Other cervical disc displacement at C5-C6 level: Secondary | ICD-10-CM | POA: Diagnosis not present

## 2019-05-31 DIAGNOSIS — Z981 Arthrodesis status: Secondary | ICD-10-CM | POA: Diagnosis not present

## 2019-05-31 DIAGNOSIS — M47816 Spondylosis without myelopathy or radiculopathy, lumbar region: Secondary | ICD-10-CM | POA: Diagnosis not present

## 2019-05-31 DIAGNOSIS — Z79891 Long term (current) use of opiate analgesic: Secondary | ICD-10-CM | POA: Diagnosis not present

## 2019-05-31 DIAGNOSIS — G8929 Other chronic pain: Secondary | ICD-10-CM | POA: Diagnosis not present

## 2019-06-04 DIAGNOSIS — M79671 Pain in right foot: Secondary | ICD-10-CM | POA: Diagnosis not present

## 2019-06-04 DIAGNOSIS — M2021 Hallux rigidus, right foot: Secondary | ICD-10-CM | POA: Diagnosis not present

## 2019-06-04 DIAGNOSIS — M2041 Other hammer toe(s) (acquired), right foot: Secondary | ICD-10-CM | POA: Diagnosis not present

## 2019-06-07 ENCOUNTER — Telehealth: Payer: Self-pay | Admitting: Orthopaedic Surgery

## 2019-06-07 ENCOUNTER — Other Ambulatory Visit: Payer: Self-pay | Admitting: Orthopaedic Surgery

## 2019-06-07 DIAGNOSIS — G8929 Other chronic pain: Secondary | ICD-10-CM

## 2019-06-07 NOTE — Telephone Encounter (Signed)
I did not do the MRI of LS spine.  Done by another PA.   No record of me speaking to her and I have no memory or any note.   Last note July 9 for shoulder pain.  She Needs to call PA who ordered MRI

## 2019-06-07 NOTE — Telephone Encounter (Signed)
Spoke with patient. She is aware that any pain medicines she needs to get from Eudelia Bunch, PA-C. I put in the referral for Dr.Nitka that Dr.Whitfield mentioned in his last office note in June.

## 2019-06-07 NOTE — Telephone Encounter (Signed)
Please see below.

## 2019-06-07 NOTE — Telephone Encounter (Signed)
Patient called stating she spoke with Aaron Edelman a couple of weeks ago who stated he would refer her to Dr. Ernestina Patches for epidural steroid injections in her neck and back.  Patient states she is in a lot of pain in her neck, back, right shoulder, right side of chest, and her right hand has numbness and tingling.  Patient states she had MRI and states her pain management doctor (who is a new grad) won't send her to a Neurologist.  Patient states she can barely move and requesting a muscle relaxer to be sent to [facility].  Patient was offered an appointment, but states she is in too much pain to drive.  Please advise.

## 2019-06-08 DIAGNOSIS — H6982 Other specified disorders of Eustachian tube, left ear: Secondary | ICD-10-CM | POA: Diagnosis not present

## 2019-06-18 DIAGNOSIS — I517 Cardiomegaly: Secondary | ICD-10-CM | POA: Diagnosis not present

## 2019-06-18 DIAGNOSIS — R0789 Other chest pain: Secondary | ICD-10-CM | POA: Diagnosis not present

## 2019-06-18 DIAGNOSIS — R079 Chest pain, unspecified: Secondary | ICD-10-CM | POA: Diagnosis not present

## 2019-06-19 DIAGNOSIS — I517 Cardiomegaly: Secondary | ICD-10-CM | POA: Diagnosis not present

## 2019-06-20 DIAGNOSIS — Z0000 Encounter for general adult medical examination without abnormal findings: Secondary | ICD-10-CM | POA: Diagnosis not present

## 2019-06-20 DIAGNOSIS — I10 Essential (primary) hypertension: Secondary | ICD-10-CM | POA: Diagnosis not present

## 2019-06-20 DIAGNOSIS — H6982 Other specified disorders of Eustachian tube, left ear: Secondary | ICD-10-CM | POA: Diagnosis not present

## 2019-06-20 DIAGNOSIS — E039 Hypothyroidism, unspecified: Secondary | ICD-10-CM | POA: Diagnosis not present

## 2019-06-22 ENCOUNTER — Other Ambulatory Visit: Payer: Self-pay

## 2019-06-22 NOTE — Patient Outreach (Signed)
[facility]) Care Management  06/22/2019  Raven Hansen 1961/02/20 [id number]   TELEPHONE SCREENING Referral date: 06/20/19 Referral source: utilization management  Referral reason: assess for needs.  Insurance: united health care  Outreach attempt # 1  Telephone call to patient regarding utilization management referral. Unable to reach. HIPAA compliant voice message left with call back phone number.   Social:  Conditions:  Medications:  Appointments:   Advance Directives:  Consent:   PLAN:  RNCM will attempt 2nd telephone call to patient within 4 business days RNCM will send outreach letter to patient.    Quinn Plowman RN,BSN,CCM [facility] Telephonic  [phone]

## 2019-06-25 ENCOUNTER — Other Ambulatory Visit: Payer: Self-pay

## 2019-06-25 NOTE — Patient Outreach (Signed)
[facility]) Care Management  06/25/2019  Raven Hansen 12-12-1960 [id number]   TELEPHONE SCREENING Referral date: 06/20/19 Referral source: utilization management  Referral reason: assess for needs.  Insurance: united health care  Telephone call to patient regarding utilization management referral. HIPAA verified by patient. RNCM explained reason for call.  Discussed and offered [facility] care management services. Patient states she was previously a patient with Dr. Nyra Capes office.She reports her last office visit was April 26, 2019. Patient states she wanted to find a new doctor due to not being happy with her care. Patient states she signed a medical release form with her new primary MD office for her medical records.  Patient states, " Not all of my records were sent to the new doctor."   RNCM advised patient to contact Dr. Nyra Capes office back to discuss.  Patient states she contacted her [place] health care provider and was given Dr. Desiree Hane.  Patient states she was seen by Dr. Donavan Burnet nurse practitioner, Romeo Rabon on 06/20/19. Patient states she broke her back in January and has not seen a specialist regarding this. Patient states she is experiencing stabbing pain, numbness radiating down her arm back and in her fingers, leg and foot..  She states It has also affected her walking.  Patient states, "It stops me in my tracts." Patient states she reported this to her new primary care provider. She states she was told they would get her referred to a neurosurgeon.  Patient states her new primary care provider works with [employer].  Patient states she would like to be seen in [place], [place].  Patient states she mentioned this to her primary care provider. Patient states she also found out she has stage 3 kidney failure.  She reports her new primary care provider referred her to a nephrologist. She reports her primary MD is 08/04/19.   RNCM advised patient to contact her  primary MD office today to ask if referral to neurosurgeon has been done. RNCM advised patient if referral has been submitted to request name and contact phone number for office.  Advised patient to contact office to schedule appointment. Patient verbalized understanding and agreement.  RNCM offered to follow up with patient within 2 business day. Patient verbally agreed.   PLAN; RNCM will follow up with patient within 2 business day.   Quinn Plowman RN,BSN,CCM [facility] Telephonic  [phone]

## 2019-06-27 ENCOUNTER — Other Ambulatory Visit: Payer: Self-pay

## 2019-06-27 NOTE — Patient Outreach (Addendum)
[facility]) Care Management  06/27/2019  Raven Hansen Jun 12, 1961 [id number]  Care coordination Referral date:06/20/19 Referral source:utilization management Referral reason:assess for needs. Insurance:united health care  Telephone call to patients primary MD office. RNCM spoke with Judson Roch to inquire if referral had been made for patient to neurosurgeon. Judson Roch states patient was previously seen by a neurosurgeon in [place].  She states an Environmental consultant from her office contacted the patient to inform her of [facility] neurosurgeon and provided patient with contact number. RNCM requested contact phone number for neurosurgeon office from [place].  RNCM contacted neurosurgeon office and spoke with Marjory Lies to set up appointment.  Patient scheduled appointment for Tuesday, July 03, 2019 at 3:30pm.  Patient to arrive 15 minutes early to complete paperwork.  RNCM contacted patient to notify her of appointment date, time,  physician and address. RNCM offered to call patient within 1 week to confirm appointment follow through.  Patient verbalized understanding.  Patient verbalized she has recently found out she has Chronic kidney disease stage 3.  Patient states she was not aware of this until she read her records.  RNCM offered follow up calls with patient to educate/ discuss further. Patient verbally agreed.   PLAN: RNCM  Will follow up with patient within 1 week.  RNCM will send welcome packet to patient RNCM will send involvement letter to patients primary MD   Quinn Plowman RN,BSN,CCM [facility] Telephonic  [phone]

## 2019-07-04 ENCOUNTER — Other Ambulatory Visit: Payer: Self-pay

## 2019-07-04 NOTE — Patient Outreach (Signed)
[facility]) Care Management  07/04/2019   Raven Hansen 23-Sep-1961 [id number]  Subjective: Telephone call to patient for follow up. HIPAA verified. Patient reports she saw Dr. Christella Noa neurosurgeon. States she will be scheduled for a nerve condition test on 07/23/19.   She states she still has numbness/ pain in right arm, fingers, leg, foot and toes.  Patient state she is scheduled to see the orthopedic on 07/05/19 for follow up on her back issues. Patient states the doctor told her she needs a kyphoplasty.  Patient reports she has a follow up scheduled with a nephrologist on 08/09/19. She reports she would like to see a nephrologist in [place], [place].  Patient states she is very concerned about her kidney's.   Patient reports she has heavy diarrhea off and on due to a 20 year history of ulcerative colitis.  She state she is using depends now for this. Patient states she is till a smoker. She states she is in the process of quitting. RNCM offered patient follow up/ assistance for smoking cessation. Patient declined stating she has tried these options before and they have not worked. Patient states she will continue to work on this herself.    Patient states she broke her neck in 2010 and she has neuropathy.  Patient she is taking her medications as prescribed. She reports she stopped taking Neurontin because it made her very forgetful.  Patient states she needs to go.  Unable to complete initial assessment at this time. Patient verbally agreed to next telephone follow up with RNCM.  RNCM advised patient to notify MD of any changes in condition prior to scheduled appointment. RNCM verified patient aware of 911 services for urgent/ emergent needs.  Objective: see assessment  Current Medications:  Current Outpatient Medications  Medication Sig Dispense Refill  . albuterol (VENTOLIN HFA) 108 (90 Base) MCG/ACT inhaler inhale 2 puffs by mouth every 4 hours for wheezing    . atenolol  (TENORMIN) 50 MG tablet Take 50 mg by mouth daily.  0  . azelastine (ASTELIN) 0.1 % nasal spray Place 2 sprays into both nostrils 2 (two) times daily. Use in each nostril as directed    . fluticasone (FLONASE) 50 MCG/ACT nasal spray INSTILL 1 SPRAY INTO EACH NOSTRIL TWICE A DAY  2  . lisinopril-hydrochlorothiazide (PRINZIDE,ZESTORETIC) 20-25 MG tablet TK 1 T PO QD  8  . SUMAtriptan (IMITREX) 100 MG tablet Take 100 mg by mouth.    . cephALEXin (KEFLEX) 500 MG capsule Take 500 mg by mouth 4 (four) times daily.    . cholestyramine (QUESTRAN) 4 g packet MIX 1 PACKET as directed and take by mouth four times a day    . gabapentin (NEURONTIN) 300 MG capsule TAKE ONE CAPSULE BY MOUTH THREE TIMES DAILY AS NEEDED FOR NEUROPATHY PAIN (Patient not taking: Reported on 07/04/2019) 90 capsule 3  . gabapentin (NEURONTIN) 600 MG tablet Take 300 mg by mouth.    Marland Kitchen HYDROcodone-acetaminophen (NORCO/VICODIN) 5-325 MG tablet     . ibuprofen (ADVIL) 800 MG tablet TAKE 1 TABLET(800 MG) BY MOUTH EVERY 8 HOURS AS NEEDED (Patient not taking: Reported on 07/04/2019) 30 tablet 0  . oxyCODONE-acetaminophen (PERCOCET/ROXICET) 5-325 MG tablet Take 1 tablet by mouth every 8 (eight) hours as needed. (Patient not taking: Reported on 05/03/2019) 10 tablet 0  . SYNTHROID 125 MCG tablet TK 1 T PO DAILY 20 TO 30 MINUTES BEFORE FIRST MEAL  2  . valACYclovir (VALTREX) 1000 MG tablet Take 1,000 mg by mouth.  No current facility-administered medications for this visit.     Functional Status:  In your present state of health, do you have any difficulty performing the following activities: 07/04/2019  Hearing? Y  Vision? Y  Difficulty concentrating or making decisions? N  Walking or climbing stairs? Y  Dressing or bathing? N  Doing errands, shopping? Y  Preparing Food and eating ? N  Using the Toilet? N  In the past six months, have you accidently leaked urine? Y  Do you have problems with loss of bowel control? Y  Managing your  Medications? N  Managing your Finances? N  Housekeeping or managing your Housekeeping? Y  Some recent data might be hidden    Fall/Depression Screening: No flowsheet data found. No flowsheet data found.  Assessment: ongoing complex case management follow up for chonic kidney disease.   Plan: RNCM will follow up with patient within 1 week to complete initial assessment.   Quinn Plowman RN,BSN,CCM [facility] Telephonic  [phone]

## 2019-07-05 ENCOUNTER — Ambulatory Visit (INDEPENDENT_AMBULATORY_CARE_PROVIDER_SITE_OTHER): Payer: Medicare Other | Admitting: Specialist

## 2019-07-05 ENCOUNTER — Ambulatory Visit: Payer: Self-pay

## 2019-07-05 ENCOUNTER — Encounter: Payer: Self-pay | Admitting: Specialist

## 2019-07-05 VITALS — BP 139/82 | HR 81 | Ht 64.0 in | Wt 158.0 lb

## 2019-07-05 DIAGNOSIS — M4722 Other spondylosis with radiculopathy, cervical region: Secondary | ICD-10-CM

## 2019-07-05 DIAGNOSIS — G5601 Carpal tunnel syndrome, right upper limb: Secondary | ICD-10-CM

## 2019-07-05 DIAGNOSIS — M545 Low back pain, unspecified: Secondary | ICD-10-CM

## 2019-07-05 DIAGNOSIS — M4322 Fusion of spine, cervical region: Secondary | ICD-10-CM | POA: Diagnosis not present

## 2019-07-05 DIAGNOSIS — M4015 Other secondary kyphosis, thoracolumbar region: Secondary | ICD-10-CM

## 2019-07-05 DIAGNOSIS — S32010G Wedge compression fracture of first lumbar vertebra, subsequent encounter for fracture with delayed healing: Secondary | ICD-10-CM

## 2019-07-05 DIAGNOSIS — M4316 Spondylolisthesis, lumbar region: Secondary | ICD-10-CM

## 2019-07-05 DIAGNOSIS — R200 Anesthesia of skin: Secondary | ICD-10-CM

## 2019-07-05 DIAGNOSIS — G8929 Other chronic pain: Secondary | ICD-10-CM

## 2019-07-05 DIAGNOSIS — R251 Tremor, unspecified: Secondary | ICD-10-CM

## 2019-07-05 MED ORDER — METHYLPREDNISOLONE 4 MG PO TBPK: ORAL_TABLET | ORAL | 0 refills | Status: DC

## 2019-07-05 MED ORDER — PREGABALIN 50 MG PO CAPS: 50.0000 mg | ORAL_CAPSULE | Freq: Two times a day (BID) | ORAL | 2 refills | Status: DC
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
## 2019-07-11 ENCOUNTER — Telehealth: Payer: Self-pay | Admitting: Specialist

## 2019-07-11 NOTE — Telephone Encounter (Signed)
Patient called stating she needs to write a letter/order to get an EMG cause she can't drive. Not sure this patient is frustrated. She needs to send orders to Dr.Yan for EMG fax#[phone].  Please call this patient [phone]

## 2019-07-12 ENCOUNTER — Telehealth: Payer: Self-pay | Admitting: *Deleted

## 2019-07-12 ENCOUNTER — Encounter: Payer: Self-pay | Admitting: Neurology

## 2019-07-12 ENCOUNTER — Ambulatory Visit: Payer: Self-pay | Admitting: Neurology

## 2019-07-12 NOTE — Telephone Encounter (Signed)
No showed new patient appointment. 

## 2019-07-13 ENCOUNTER — Ambulatory Visit: Payer: Self-pay

## 2019-07-16 ENCOUNTER — Telehealth: Payer: Self-pay

## 2019-07-16 NOTE — Telephone Encounter (Signed)
Nickie with [employer].called stating that patient would just like to be seen for NCV only instead of coming to an consult appointment first.  Would like to know if a new referral could be put in for just the NCV study only?  Cb# is [phone].  Please advise.  Thank you.

## 2019-07-16 NOTE — Telephone Encounter (Signed)
Patient called back stating that she does not have an appointment at [facility] because she needs our office to send the records to Dr. Rhea Belton office so just has to make one appointment with Dr. Krista Blue, not two different ones.  She is also requesting an appointment with Dr. Louanne Skye before her next appointment due to getting worse.   Thank you.

## 2019-07-16 NOTE — Telephone Encounter (Signed)
It looks like her appt has been @ [place] with Dr. Krista Blue

## 2019-07-17 ENCOUNTER — Telehealth: Payer: Self-pay | Admitting: Specialist

## 2019-07-17 ENCOUNTER — Telehealth: Payer: Self-pay | Admitting: Orthopaedic Surgery

## 2019-07-17 NOTE — Telephone Encounter (Signed)
Spoke with patient and scheduled appointment for Thursday.

## 2019-07-17 NOTE — Telephone Encounter (Signed)
Raven Hansen called stating her right shoulder is very painful and "feels like it is popping out of place."   Raven Hansen states she has an appointment scheduled with Dr. Durward Fortes on 08/08/19 for shoulder injections.  Raven Hansen is requesting a return call.

## 2019-07-17 NOTE — Telephone Encounter (Signed)
Mailed records release form to patient per her request

## 2019-07-17 NOTE — Telephone Encounter (Signed)
I called and spoke with patient and advised that I spoke with Dr. Louanne Skye and he advised that we need to get a neurologist on board with her case and have her evaled with them.  But she will also need the NCS/EMG done.  She said ok that she needs to get these scheduled.  I did call Nickie at [facility] and spoke with her about getting the patient scheduled for Consult and EMG/NCS done.  Nickie said she would be looking for something to open sooner. I called and advised patient of this.

## 2019-07-17 NOTE — Telephone Encounter (Signed)
I called and spoke with patient and advised that I spoke with Dr. Louanne Skye and he advised that we need to get a neurologist on board with her case and have her evaled with them.  But she will also need the NCS/EMG done.  She said ok that she needs to get these scheduled.  I did call Nickie at [facility] and spoke with her about getting the patient scheduled for Consult and EMG/NCS done.  Nickie said she would be looking for something to open sooner. I called and advised patient of this.

## 2019-07-18 ENCOUNTER — Other Ambulatory Visit: Payer: Self-pay

## 2019-07-18 NOTE — Addendum Note (Signed)
Addended by: Quinn Plowman E on: 07/18/2019 04:38 PM   Modules accepted: Orders

## 2019-07-18 NOTE — Patient Outreach (Addendum)
[facility]) Care Management  07/18/2019   Raven Hansen 1961-09-09 [id number]  Subjective: Telephone call to patient for follow up. HIPAA verified.  Patient states she continues to have the right sided numbness in her arm/ legs/ fingers/ toes.   Patient states she saw the orthopedic doctor on 07/05/19 and saw the neurosurgeon.  Patient states she is scheduled to have a nerve conduction study test on 07/23/19.  Patient reports she has a follow up appointment with Dr. Louanne Skye on 08/02/19 and is scheduled to see a nephrologist on 08/09/19.   RNCM discussed CKD with patient and ways to manage at home.  Patient states, this is very helpful information."  Patient states she does not have blood pressure equipment to monitor her blood pressure.   RNCM advised patient to contact her member services for [employer] an inquire about the [facility] catalogue to order a blood pressure monitor. Patient verbalized understanding. Patient states she should have money accrued to order from the [facility] catalogue because she has not used it.   Patient states a nurse from [place] [facility] is scheduled to see her on July 30, 2019. Patient states she has a follow up appointment with her orthopedic doctor on tomorrow 07/19/19.   Patient state she need see and eye doctor.  RNCM advised patient to request a list of in plan eye doctors when she calls the member services number for [facility].  Patient states she is not able to be as active with the numbness and back pain she is experiencing. Patient reports she does not have scales to weigh with. RNCM advised patient to look at ordering scale from her [place]. Patient verbalized understanding. RNCM advised patient to keep a log of her blood pressures and weight when she receives her monitoring equipment. Patient states she likes to monitor her blood sugars because she has hypoglycemia.   RNCM recommended patient contact her primary MD office and request a prescription  from her doctor for a glucometer.  Patient states she would like to have an advanced directive.  RNCM offered to refer patient to [facility] social worker for follow up on Advanced directive. Patient verbally agreed.  RNCM inquired if patient would be interested in receiving information for Medical alert services due to living alone. Patient verbally agreed. RNCM informed patient she would request the social worker speak with her regarding a medical alert service as well as Advance directives. Patient states she would like to have a COVID test for piece of mind.  RNCM advised patient to contact her primary MD office for advisement on COVID testing. Patient denies taking any medication or receiving any counseling for anxiety or depression. Patient states she cannot take antidepressant medications because makes her confused/ forgetful.  Patient denies any further concerns at this time. Patient verbally agreed to next follow up with RNCM.   Objective: see [facility] assessment.   Current Medications:  Current Outpatient Medications  Medication Sig Dispense Refill  . albuterol (VENTOLIN HFA) 108 (90 Base) MCG/ACT inhaler inhale 2 puffs by mouth every 4 hours for wheezing    . atenolol (TENORMIN) 50 MG tablet Take 50 mg by mouth daily.  0  . azelastine (ASTELIN) 0.1 % nasal spray Place 2 sprays into both nostrils 2 (two) times daily. Use in each nostril as directed    . cephALEXin (KEFLEX) 500 MG capsule Take 500 mg by mouth 4 (four) times daily.    . cholestyramine (QUESTRAN) 4 g packet MIX 1 PACKET as  directed and take by mouth four times a day    . fluticasone (FLONASE) 50 MCG/ACT nasal spray INSTILL 1 SPRAY INTO EACH NOSTRIL TWICE A DAY  2  . HYDROcodone-acetaminophen (NORCO/VICODIN) 5-325 MG tablet     . ibuprofen (ADVIL) 800 MG tablet TAKE 1 TABLET(800 MG) BY MOUTH EVERY 8 HOURS AS NEEDED (Patient not taking: Reported on 07/04/2019) 30 tablet 0  . lisinopril-hydrochlorothiazide (PRINZIDE,ZESTORETIC) 20-25 MG tablet  TK 1 T PO QD  8  . methylPREDNISolone (MEDROL DOSEPAK) 4 MG TBPK tablet Take as directed. 21 tablet 0  . oxyCODONE-acetaminophen (PERCOCET/ROXICET) 5-325 MG tablet Take 1 tablet by mouth every 8 (eight) hours as needed. (Patient not taking: Reported on 05/03/2019) 10 tablet 0  . pregabalin (LYRICA) 50 MG capsule Take 1 capsule (50 mg total) by mouth 2 (two) times daily. 60 capsule 2  . SUMAtriptan (IMITREX) 100 MG tablet Take 100 mg by mouth.    . SYNTHROID 125 MCG tablet TK 1 T PO DAILY 20 TO 30 MINUTES BEFORE FIRST MEAL  2  . valACYclovir (VALTREX) 1000 MG tablet Take 1,000 mg by mouth.     No current facility-administered medications for this visit.     Functional Status:  In your present state of health, do you have any difficulty performing the following activities: 07/04/2019  Hearing? Y  Vision? Y  Difficulty concentrating or making decisions? N  Walking or climbing stairs? Y  Dressing or bathing? N  Doing errands, shopping? Y  Preparing Food and eating ? N  Using the Toilet? N  In the past six months, have you accidently leaked urine? Y  Do you have problems with loss of bowel control? Y  Managing your Medications? N  Managing your Finances? N  Housekeeping or managing your Housekeeping? Y  Some recent data might be hidden    Fall/Depression Screening: No flowsheet data found. PHQ 2/9 Scores 07/18/2019  PHQ - 2 Score 2  PHQ- 9 Score 6   [facility] CM Care Plan Problem One     Most Recent Value  Care Plan Problem One  knowledge deficit related to chronic kidney disease  Role Documenting the Problem One  Care Management Telephonic Coordinator  Care Plan for Problem One  Active  [facility] Long Term Goal   patient will be able to state 3 things she can do to keep CKD from getting worse within 31 days  [facility] Long Term Goal Start Date  06/28/19  [facility] Long Term Goal Met Date  -- [ongoing goal]  Interventions for Problem One Long Term Goal  RNCM discussed CKD and home management  [facility] CM Short  Term Goal #1   Patient will reports she is taking her medications as directed by her doctor with 2 weeks.   [facility] CM Short Term Goal #1 Start Date  06/28/19  [facility] CM Short Term Goal #1 Met Date  07/18/19  [facility] CM Short Term Goal #2   patient will report receiving/ securing blood pressure monitor and weigh scales within 30 days  [facility] CM Short Term Goal #2 Start Date  07/18/19  Interventions for Short Term Goal #2  RNCM advised patient to contact her member services with [facility] to order from her medical equipment catalogue.    [facility] CM Care Plan Problem Two     Most Recent Value  Care Plan Problem Two  patient unable to secure appointment with neursurgeon to assess right sided numbness/pain and difficulty walking  Role Documenting the Problem Two  Care  Management Telephonic Coordinator  [facility] CM Short Term Goal #1      [facility] CM Short Term Goal #1 Start Date  06/28/19  [facility] CM Short Term Goal #1 Met Date   06/28/19    [facility] CM Care Plan Problem Three     Most Recent Value  Care Plan Problem Three  Patient request need for Advanced directive and lacks social support   Role Documenting the Problem Three  Care Management Telephonic Coordinator  Care Plan for Problem Three  Active  [facility] CM Short Term Goal #1   Patient will report completing advance directive application within 30 days  [facility] CM Short Term Goal #1 Start Date  07/18/19  Interventions for Short Term Goal #1  RNCM referred patient to [facility] social worker   [facility] CM Short Term Goal #2   Patient will report receiving information on Medical alert support within 30 days.   [facility] CM Short Term Goal #2 Start Date  07/18/19  Interventions for Short Term Goal #2  RNCM referred patient to social worker to discuss medical alert service support     Assessment: patient will benefit from referral to social worker for Advance directive and Medical alert information.  Plan: [facility] will follow up with patient within 2 weeks.  RNCM will refer patient to Education officer, museum.    Quinn Plowman RN,BSN,CCM [facility] Telephonic  [phone]

## 2019-07-19 ENCOUNTER — Other Ambulatory Visit: Payer: Self-pay

## 2019-07-19 ENCOUNTER — Encounter: Payer: Self-pay | Admitting: Orthopaedic Surgery

## 2019-07-19 ENCOUNTER — Ambulatory Visit (INDEPENDENT_AMBULATORY_CARE_PROVIDER_SITE_OTHER): Payer: Medicare Other | Admitting: Orthopaedic Surgery

## 2019-07-19 VITALS — BP 100/72 | HR 77 | Ht 64.0 in | Wt 160.0 lb

## 2019-07-19 DIAGNOSIS — M1732 Unilateral post-traumatic osteoarthritis, left knee: Secondary | ICD-10-CM

## 2019-07-19 DIAGNOSIS — M7541 Impingement syndrome of right shoulder: Secondary | ICD-10-CM | POA: Diagnosis not present

## 2019-07-19 MED ORDER — METHYLPREDNISOLONE ACETATE 40 MG/ML IJ SUSP
80.0000 mg | INTRAMUSCULAR | Status: AC | PRN
  Administered 2019-07-19: 80 mg via INTRA_ARTICULAR

## 2019-07-19 MED ORDER — BUPIVACAINE HCL 0.5 % IJ SOLN
2.0000 mL | INTRAMUSCULAR | Status: AC | PRN
  Administered 2019-07-19: 2 mL via INTRA_ARTICULAR

## 2019-07-19 MED ORDER — LIDOCAINE HCL 1 % IJ SOLN
2.0000 mL | INTRAMUSCULAR | Status: AC | PRN
  Administered 2019-07-19: 2 mL

## 2019-07-19 MED ORDER — LIDOCAINE HCL 2 % IJ SOLN
2.0000 mL | INTRAMUSCULAR | Status: AC | PRN
  Administered 2019-07-19: 2 mL

## 2019-07-19 NOTE — Patient Outreach (Signed)
[facility]) Care Management  07/19/2019  Sheneta Ruan 03/06/1961 [id number]   Social work referral received from [facility], Quinn Plowman to assist patient with Advance directive and to discuss medical alert services.  Successful outreach to patient today.  Discussed HC POA and Living Will.  Will mail Advance Directive EMMI and packet. Patient is interested in medical alert system but reports that she cannot afford out-of-pocket expense.  She inquired about whether or not this is covered by [facility]; encouraged her to contact customer service to inquire. Patient did agree to list of systems around the $20/month range being mailed to her.  Will follow up within the next two weeks to ensure receipt of information mailed and further review Advance Directives.  Ronn Melena, BSW Social Worker [phone]

## 2019-07-19 NOTE — Progress Notes (Signed)
Office Visit Note   Patient: Raven Hansen           Date of Birth: [birth year]           MRN: [id number] Visit Date: 07/19/2019              Requested by: [name], MD [id number] [place],  [place] [address] PCP: [name], MD   Assessment & Plan: Visit Diagnoses:  1. Rotator cuff impingement syndrome of right shoulder   2. Post-traumatic osteoarthritis of left knee     Plan: Will inject subacromial space right shoulder and left knee joint with cortisone.  Had good results with prior injections that same 2 spaces.  Follow-up with Dr. Louanne Skye for cervical thoracic and lumbar spine issues.  Also requested a TENS unit.  We will give her a prescription.  Total office time 30 minutes 50% of the time in counseling  Follow-Up Instructions: Return if symptoms worsen or fail to improve.   Orders:  Orders Placed This Encounter  Procedures  . Large Joint Inj: R subacromial bursa  . Large Joint Inj: L knee   No orders of the defined types were placed in this encounter.     Procedures: Large Joint Inj: R subacromial bursa on 07/19/2019 4:31 PM Indications: pain and diagnostic evaluation Details: 25 G 1.5 in needle, anterolateral approach  Arthrogram: No  Medications: 2 mL bupivacaine 0.5 %; 2 mL lidocaine 2 %; 80 mg methylPREDNISolone acetate 40 MG/ML Consent was given by the patient. Immediately prior to procedure a time out was called to verify the correct patient, procedure, equipment, support staff and site/side marked as required. Patient was prepped and draped in the usual sterile fashion.   Large Joint Inj: L knee on 07/19/2019 4:32 PM Indications: pain and diagnostic evaluation Details: 25 G 1.5 in needle, anteromedial approach  Arthrogram: No  Medications: 2 mL lidocaine 1 %; 2 mL bupivacaine 0.5 %; 80 mg methylPREDNISolone acetate 40 MG/ML Procedure, treatment alternatives, risks and benefits explained, specific risks discussed. Consent  was given by the patient. Patient was prepped and draped in the usual sterile fashion.       Clinical Data: No additional findings.   Subjective: Chief Complaint  Patient presents with  . Right Shoulder - Follow-up  Patient presents today for follow up on her right shoulder. She was last evaluated on 05/03/2019 for shoulder impingement. Patient is having pain in her shoulder and wants a cortisone injection. Her last injection in her right shoulder was April of 2020. She is also complaining of left knee pain. She said that her left knee has been bothering her her entire life. She is wanting another cortisone injection in her knee as well. She is going to have spinal surgery with Dr.Nitka and states that she will need the injections to help her ambulate around after surgery.  Has had prior films of her left knee demonstrating decrease in the medial lateral joint space with possible CPPD  HPI  Review of Systems   Objective: Vital Signs: BP 100/72   Pulse 77   Ht 5\' 4"  (1.626 m)   Wt 160 lb (72.6 kg)   BMI 27.46 kg/m   Physical Exam  Ortho Exam awake alert and oriented x3.  Comfortable sitting positive impingement right shoulder but able to place it fully overhead.  Good strength.  No popping or clicking.  Skin intact.  Left knee with no effusion.  Multiple scars that are well-healed.  No opening with varus or valgus stress.  Slight anterior drawer sign.  Negative Lockman's.  Specialty Comments:  No specialty comments available.  Imaging: No results found.   PMFS History: Patient Active Problem List   Diagnosis Date Noted  . Chondrocalcinosis due to dicalcium phosphate crystals, of the knee, right 04/12/2019  . Chronic pain of both shoulders 01/30/2019  . Bilateral primary osteoarthritis of knee 01/08/2019  . Exposure to communicable disease 05/09/2018  . Flying phobia 05/09/2018  . H/O bee sting allergy 05/09/2018  . Mixed hyperlipidemia 05/09/2018  . Allergic contact  dermatitis 04/14/2018  . Preop general physical exam 12/14/2017  . Mass of right hand 07/14/2017  . Cat scratch of forearm, right, initial encounter 06/30/2017  . Family history of breast cancer 06/22/2017  . History of ovarian cancer 06/22/2017  . Bipolar affective disorder in remission ([place]) 04/13/2017  . Allergic dermatitis due to poison ivy 03/10/2017  . Myofascial pain 03/10/2017  . Acute bronchitis due to Mycoplasma pneumoniae 02/17/2017  . Anemia 02/17/2017  . Cervical radiculopathy 02/17/2017  . Chronic pain syndrome 02/17/2017  . Complete tear of anterior cruciate ligament of knee 02/17/2017  . Dental infection 02/17/2017  . Epidermal cyst 02/17/2017  . Failed back surgical syndrome 02/17/2017  . Hypoglycemia 02/17/2017  . Lesion of nipple 02/17/2017  . DJD (degenerative joint disease) of knee 02/17/2017  . Petechiae 02/17/2017  . Rotator cuff impingement syndrome of right shoulder 02/17/2017  . Seizures ([place]) 02/17/2017  . Anxiety and depression 01/19/2017  . Cervical disc disease 01/19/2017  . Chronic bronchitis ([place]) 01/19/2017  . Contusion of right great toe without damage to nail 01/19/2017  . Migraine aura, persistent, intractable 01/19/2017  . Viral upper respiratory tract infection 01/19/2017  . Chronic diarrhea 01/14/2017  . Bipolar 2 disorder, major depressive episode ([place]) 08/26/2016  . Tobacco use disorder, severe, dependence 08/26/2016  . Benzodiazepine dependence ([place]) 08/25/2016  . Pain in right knee 07/02/2016  . Dry eye syndrome 06/17/2016  . Episode of recurrent major depressive disorder ([place]) 06/17/2016  . Essential hypertension 06/17/2016  . GERD without esophagitis 06/17/2016  . Hepatitis C 06/17/2016  . Herniated cervical disc 06/17/2016  . Herpes simplex vulvovaginitis 06/17/2016  . Hypothyroidism (acquired) 06/17/2016  . Insomnia 06/17/2016  . Malaise and fatigue 06/17/2016  . Polycystic kidney disease 06/17/2016  . Primary osteoarthritis  involving multiple joints 06/17/2016  . Allergic rhinitis 08/07/2014  . Anxiety state 08/07/2014  . Cough 08/07/2014  . Other amnesia 08/07/2014  . Other fatigue 08/07/2014  . Severe episode of recurrent major depressive disorder, with psychotic features ([place]) 08/07/2014  . Tobacco use 08/07/2014   Past Medical History:  Diagnosis Date  . Acid reflux   . Cancer ([place])    ovarian cancer  . Cataract   . Chronic kidney disease    Stage 3  . Colitis   . Compound fracture    L1  . COPD (chronic obstructive pulmonary disease) ([place])   . Hepatitis C   . Hypertension   . Neck fracture ([place])   . Neuromuscular disorder ([facility])    neuropathy  . Thyroid disease     Family History  Problem Relation Age of Onset  . Diabetes Mother   . Diabetes Father   . Hypertension Father   . Cancer Father   . Hypertension Sister   . Hypertension Brother     Past Surgical History:  Procedure Laterality Date  . CHOLECYSTECTOMY    . FOOT SURGERY    .  KNEE ARTHROSCOPY    . NECK SURGERY    . SHOULDER ARTHROSCOPY    . VAGINAL HYSTERECTOMY     Social History   Occupational History  . Not on file  Tobacco Use  . Smoking status: Current Every Day Smoker    Packs/day: 0.25    Years: 37.00    Pack years: 9.25    Types: Cigarettes  . Smokeless tobacco: Never Used  Substance and Sexual Activity  . Alcohol use: Yes    Comment: RARELY  . Drug use: Never  . Sexual activity: Not on file

## 2019-07-23 ENCOUNTER — Ambulatory Visit: Payer: Medicare Other | Admitting: Neurology

## 2019-07-31 ENCOUNTER — Telehealth (HOSPITAL_COMMUNITY): Payer: Self-pay

## 2019-07-31 ENCOUNTER — Other Ambulatory Visit (HOSPITAL_COMMUNITY): Payer: Self-pay | Admitting: Interventional Radiology

## 2019-07-31 DIAGNOSIS — S32010A Wedge compression fracture of first lumbar vertebra, initial encounter for closed fracture: Secondary | ICD-10-CM

## 2019-07-31 NOTE — Telephone Encounter (Signed)
Called to schedule mri, no answer, left vm. AW 

## 2019-08-01 ENCOUNTER — Other Ambulatory Visit: Payer: Self-pay | Admitting: *Deleted

## 2019-08-01 ENCOUNTER — Ambulatory Visit: Payer: Self-pay

## 2019-08-01 NOTE — Patient Outreach (Signed)
[facility]) Care Management  08/01/2019  Raven Hansen Jul 10, 1961 [id number]   Telephone Assessment  RN attempted outreach today and spoke briefly with pt. RN able to verify identifiers and inquired if this was a good time to talk. Pt was aware that [facility] RN was going to call but not feeling well today and requested RN to call her back next week. RN offered to assist with any symptoms or needs at this time however pt appreciative but opt to declined.  PLAN: RN will follow next week as requested.   Raina Mina, RN Care Management Coordinator [place] Office [phone]

## 2019-08-02 ENCOUNTER — Ambulatory Visit (INDEPENDENT_AMBULATORY_CARE_PROVIDER_SITE_OTHER): Payer: Medicare Other | Admitting: Specialist

## 2019-08-02 ENCOUNTER — Encounter: Payer: Self-pay | Admitting: Specialist

## 2019-08-02 ENCOUNTER — Other Ambulatory Visit: Payer: Self-pay

## 2019-08-02 ENCOUNTER — Telehealth (HOSPITAL_COMMUNITY): Payer: Self-pay

## 2019-08-02 ENCOUNTER — Ambulatory Visit: Payer: Self-pay

## 2019-08-02 VITALS — BP 118/78 | HR 70 | Ht 64.0 in | Wt 157.0 lb

## 2019-08-02 DIAGNOSIS — S32010A Wedge compression fracture of first lumbar vertebra, initial encounter for closed fracture: Secondary | ICD-10-CM

## 2019-08-02 NOTE — Telephone Encounter (Signed)
Pt returned call. Informed her that Dr. Estanislado Pandy wanted to do a new mri to see if she has a second fracture according to the plain films done on 07/05/19. In case he has to treat two fractures instead of just one. Pt didn't understand why she needed a new mri. She is going to see Dr. Louanne Skye today at 2pm and will talk to him and call back if she decides to schedule. AW

## 2019-08-02 NOTE — Progress Notes (Addendum)
Office Visit Note   Patient: Raven Hansen           Date of Birth: Mar 19, 1961           MRN: [id number] Visit Date: 08/02/2019              Requested by: [name], MD [id number] [place],  [place] [address] PCP: [name], MD   Assessment & Plan: Visit Diagnoses:  1. Closed compression fracture of body of L1 vertebra (HCC)     Plan: See a neurologist to  Evaluate for right hand and right leg numbness. Will repeat MRI of the lumbar spine to determine if there is any change in the spinal canal at the L1 compression fracture.  Follow-Up Instructions: Return in about 4 weeks (around 08/30/2019).   Orders:  No orders of the defined types were placed in this encounter.  No orders of the defined types were placed in this encounter.     Procedures: No procedures performed   Clinical Data: No additional findings.   Subjective: Chief Complaint  Patient presents with  . Lower Back - Follow-up    58 year old female with history of L1 compression fracture with Right hand numbness and right leg numbness. She is still painful in the area of lumbar fracture. The plain radiographs show mild DDD and collaps of the L1 vertebrae to nearly 50% No bowel or bladder difficulty, does have colitis. Returns today reporting Dr. Estanislado Pandy interventional radiology recommeded repeat MRI before Kyphoplasty her last MRI was 04/2019.     Review of Systems  Constitutional: Negative.   HENT: Negative.   Eyes: Negative.   Respiratory: Negative.   Cardiovascular: Negative.   Gastrointestinal: Negative.   Endocrine: Negative.   Genitourinary: Negative.   Musculoskeletal: Negative.   Allergic/Immunologic: Negative.   Neurological: Negative.   Hematological: Negative.   Psychiatric/Behavioral: Negative.      Objective: Vital Signs: BP 118/78 (BP Location: Left Arm, Patient Position: Sitting)   Pulse 70   Ht 5\' 4"  (1.626 m)   Wt 157 lb (71.2 kg)   BMI  26.95 kg/m   Physical Exam Constitutional:      Appearance: She is well-developed.  HENT:     Head: Normocephalic and atraumatic.  Eyes:     Pupils: Pupils are equal, round, and reactive to light.  Neck:     Musculoskeletal: Normal range of motion and neck supple.  Pulmonary:     Effort: Pulmonary effort is normal.     Breath sounds: Normal breath sounds.  Abdominal:     General: Bowel sounds are normal.     Palpations: Abdomen is soft.  Musculoskeletal: Normal range of motion.  Skin:    General: Skin is warm and dry.  Neurological:     Mental Status: She is alert and oriented to person, place, and time.  Psychiatric:        Behavior: Behavior normal.        Thought Content: Thought content normal.        Judgment: Judgment normal.     Back Exam   Tenderness  The patient is experiencing tenderness in the lumbar.  Range of Motion  Extension: normal  Flexion: normal  Lateral bend right: normal  Lateral bend left: normal  Rotation right: normal  Rotation left: normal   Muscle Strength  Right Quadriceps:  5/5  Left Quadriceps:  5/5  Right Hamstrings:  5/5  Left Hamstrings:  5/5  Tests  Straight leg raise right: negative Straight leg raise left: negative  Other  Toe walk: normal Heel walk: normal Sensation: normal Gait: normal  Erythema: no back redness Scars: absent      Specialty Comments:  No specialty comments available.  Imaging: No results found.   PMFS History: Patient Active Problem List   Diagnosis Date Noted  . Chondrocalcinosis due to dicalcium phosphate crystals, of the knee, right 04/12/2019  . Chronic pain of both shoulders 01/30/2019  . Bilateral primary osteoarthritis of knee 01/08/2019  . Exposure to communicable disease 05/09/2018  . Flying phobia 05/09/2018  . H/O bee sting allergy 05/09/2018  . Mixed hyperlipidemia 05/09/2018  . Allergic contact dermatitis 04/14/2018  . Preop general physical exam 12/14/2017  . Mass  of right hand 07/14/2017  . Cat scratch of forearm, right, initial encounter 06/30/2017  . Family history of breast cancer 06/22/2017  . History of ovarian cancer 06/22/2017  . Bipolar affective disorder in remission ([place]) 04/13/2017  . Allergic dermatitis due to poison ivy 03/10/2017  . Myofascial pain 03/10/2017  . Acute bronchitis due to Mycoplasma pneumoniae 02/17/2017  . Anemia 02/17/2017  . Cervical radiculopathy 02/17/2017  . Chronic pain syndrome 02/17/2017  . Complete tear of anterior cruciate ligament of knee 02/17/2017  . Dental infection 02/17/2017  . Epidermal cyst 02/17/2017  . Failed back surgical syndrome 02/17/2017  . Hypoglycemia 02/17/2017  . Lesion of nipple 02/17/2017  . DJD (degenerative joint disease) of knee 02/17/2017  . Petechiae 02/17/2017  . Rotator cuff impingement syndrome of right shoulder 02/17/2017  . Seizures ([place]) 02/17/2017  . Anxiety and depression 01/19/2017  . Cervical disc disease 01/19/2017  . Chronic bronchitis ([place]) 01/19/2017  . Contusion of right great toe without damage to nail 01/19/2017  . Migraine aura, persistent, intractable 01/19/2017  . Viral upper respiratory tract infection 01/19/2017  . Chronic diarrhea 01/14/2017  . Bipolar 2 disorder, major depressive episode ([place]) 08/26/2016  . Tobacco use disorder, severe, dependence 08/26/2016  . Benzodiazepine dependence ([place]) 08/25/2016  . Pain in right knee 07/02/2016  . Dry eye syndrome 06/17/2016  . Episode of recurrent major depressive disorder ([place]) 06/17/2016  . Essential hypertension 06/17/2016  . GERD without esophagitis 06/17/2016  . Hepatitis C 06/17/2016  . Herniated cervical disc 06/17/2016  . Herpes simplex vulvovaginitis 06/17/2016  . Hypothyroidism (acquired) 06/17/2016  . Insomnia 06/17/2016  . Malaise and fatigue 06/17/2016  . Polycystic kidney disease 06/17/2016  . Primary osteoarthritis involving multiple joints 06/17/2016  . Allergic rhinitis 08/07/2014  .  Anxiety state 08/07/2014  . Cough 08/07/2014  . Other amnesia 08/07/2014  . Other fatigue 08/07/2014  . Severe episode of recurrent major depressive disorder, with psychotic features ([place]) 08/07/2014  . Tobacco use 08/07/2014   Past Medical History:  Diagnosis Date  . Acid reflux   . Cancer ([place])    ovarian cancer  . Cataract   . Chronic kidney disease    Stage 3  . Colitis   . Compound fracture    L1  . COPD (chronic obstructive pulmonary disease) ([place])   . Hepatitis C   . Hypertension   . Neck fracture ([place])   . Neuromuscular disorder (HCC)    neuropathy  . Thyroid disease     Family History  Problem Relation Age of Onset  . Diabetes Mother   . Diabetes Father   . Hypertension Father   . Cancer Father   . Hypertension Sister   . Hypertension Brother  Past Surgical History:  Procedure Laterality Date  . CHOLECYSTECTOMY    . FOOT SURGERY    . KNEE ARTHROSCOPY    . NECK SURGERY    . SHOULDER ARTHROSCOPY    . VAGINAL HYSTERECTOMY     Social History   Occupational History  . Not on file  Tobacco Use  . Smoking status: Current Every Day Smoker    Packs/day: 0.25    Years: 37.00    Pack years: 9.25    Types: Cigarettes  . Smokeless tobacco: Never Used  Substance and Sexual Activity  . Alcohol use: Yes    Comment: RARELY  . Drug use: Never  . Sexual activity: Not on file

## 2019-08-02 NOTE — Addendum Note (Signed)
Addended by: Minda Ditto, Geoffery Spruce on: 08/02/2019 04:08 PM   Modules accepted: Orders

## 2019-08-02 NOTE — Patient Outreach (Signed)
[place] [facility]) Care Management  08/02/2019  Lynnley Maggiacomo 07/18/1961 [id number]   Follow up call scheduled with patient today to ensure receipt of and review Advance Directives.   Note entered by [facility], Raina Mina, yesterday that patient was not feeling well and requested outreach next week. Will schedule follow up for next week.  Ronn Melena, BSW Social Worker [phone]

## 2019-08-05 ENCOUNTER — Other Ambulatory Visit: Payer: Self-pay

## 2019-08-05 ENCOUNTER — Ambulatory Visit (HOSPITAL_COMMUNITY)
Admission: RE | Admit: 2019-08-05 | Discharge: 2019-08-05 | Disposition: A | Discharge status: Home / Self Care | Payer: Medicare Other | Source: Ambulatory Visit | Attending: Interventional Radiology | Admitting: Interventional Radiology

## 2019-08-05 DIAGNOSIS — S32010A Wedge compression fracture of first lumbar vertebra, initial encounter for closed fracture: Secondary | ICD-10-CM | POA: Insufficient documentation

## 2019-08-06 ENCOUNTER — Other Ambulatory Visit: Payer: Self-pay

## 2019-08-06 ENCOUNTER — Encounter: Payer: Medicare Other | Admitting: Neurology

## 2019-08-06 ENCOUNTER — Other Ambulatory Visit (HOSPITAL_COMMUNITY): Payer: Self-pay | Admitting: Interventional Radiology

## 2019-08-06 ENCOUNTER — Ambulatory Visit: Payer: Self-pay

## 2019-08-06 DIAGNOSIS — S32010A Wedge compression fracture of first lumbar vertebra, initial encounter for closed fracture: Secondary | ICD-10-CM

## 2019-08-06 NOTE — Patient Outreach (Signed)
[place] [facility]) Care Management  08/06/2019  Raven Hansen 1960-12-25 [id number]   Attempted to contact patient multiple times today to ensure receipt of Advance Directives and medical alert resources; got busy signal each time.  Will attempt to reach again within four business days.  Ronn Melena, BSW Social Worker [phone]

## 2019-08-07 ENCOUNTER — Other Ambulatory Visit: Payer: Self-pay | Admitting: *Deleted

## 2019-08-07 ENCOUNTER — Other Ambulatory Visit (HOSPITAL_COMMUNITY): Payer: Self-pay | Admitting: Interventional Radiology

## 2019-08-07 DIAGNOSIS — S32010A Wedge compression fracture of first lumbar vertebra, initial encounter for closed fracture: Secondary | ICD-10-CM

## 2019-08-07 NOTE — Patient Outreach (Signed)
[place] [facility]) Care Management  08/07/2019  Raven Hansen 1961/03/09 [id number]   Referral received Raven Hansen  Telephone Assessment  RN spoke with pt today and inquired further on her ongoing management of care. Pt states several things are going on at this time pending back surgery and several pending appointments (kidney 10/15). States the [facility] RN visited on yesterday and tested her BP home device that is not working. Pt has obtained scales but has not contacted [facility] for the BP device yet. Reports [facility] social worker concerning to work with her concerning A.D and other community resources.   RN inquired further concerning the review of her plan of care related to CKD as pt again indicated a pending visit tomorrow. RN reiterated on hydration and strongly encouraged pt to follow the provider's instructions concerning managing her kidney function (pt verbalized an understanding). Goals and interventions discussed with needed adjustments. Due to pt's busy schedule RN inquired on when to follow up. Pt receptive to next month as RN provider names and contact number if pt needs RN case manager sooner.   No other inquires or request at this time. RN will schedule next appointment next month and follow up with pt's progress.  [facility] CM Care Plan Problem One     Most Recent Value  Care Plan Problem One  knowledge deficit related to chronic kidney disease  Role Documenting the Problem One  Care Management Telephonic Coordinator  Care Plan for Problem One  Active  [facility] Long Term Goal   patient will be able to state 3 things she can do to keep CKD from getting worse within 31 days  [facility] Long Term Goal Start Date  06/28/19  Interventions for Problem One Long Term Goal  WIll continue to educate on this topic and reiterate on preventive measures and signs of CKD. WIll re-evaluate pt's knowledge next month on pt's progress.      Raven Mina, RN Care Management Coordinator [place] Office [phone]

## 2019-08-08 ENCOUNTER — Other Ambulatory Visit: Payer: Self-pay

## 2019-08-08 ENCOUNTER — Ambulatory Visit: Payer: Medicare Other | Admitting: Orthopaedic Surgery

## 2019-08-08 NOTE — Patient Outreach (Signed)
[place] [facility]) Care Management  08/08/2019  Raven Hansen [birth year] [id number]  Successful follow up call to patient today.  She confirmed receipt of Advance Directives and medical alert resources mailed on 08/06/19.   Patient has made a decision about her [facility] POA and stated that she discussed it with this individual.  Patient stated that she plans to have documents completed prior to surgery on 08/21/19.  Talked with patient about what to do with documents once completed. Patient denied need for further assistance at this time.  Closing social work case at this time but patient did state that she will call if additional assistance is needed.  Ronn Melena, BSW Social Worker [phone]

## 2019-08-16 ENCOUNTER — Ambulatory Visit (HOSPITAL_COMMUNITY)
Admission: RE | Admit: 2019-08-16 | Discharge: 2019-08-16 | Disposition: A | Discharge status: Home / Self Care | Payer: Medicare Other | Source: Ambulatory Visit | Attending: Interventional Radiology | Admitting: Interventional Radiology

## 2019-08-16 ENCOUNTER — Other Ambulatory Visit: Payer: Self-pay

## 2019-08-16 DIAGNOSIS — S32010A Wedge compression fracture of first lumbar vertebra, initial encounter for closed fracture: Secondary | ICD-10-CM
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
## 2019-08-17 ENCOUNTER — Other Ambulatory Visit (HOSPITAL_COMMUNITY): Payer: Self-pay | Admitting: Interventional Radiology

## 2019-08-17 DIAGNOSIS — S32010A Wedge compression fracture of first lumbar vertebra, initial encounter for closed fracture: Secondary | ICD-10-CM

## 2019-08-18 ENCOUNTER — Other Ambulatory Visit (HOSPITAL_COMMUNITY): Payer: Medicare Other | Attending: Interventional Radiology

## 2019-08-20 ENCOUNTER — Other Ambulatory Visit: Payer: Self-pay | Admitting: Radiology

## 2019-08-21 ENCOUNTER — Other Ambulatory Visit: Payer: Self-pay

## 2019-08-21 ENCOUNTER — Other Ambulatory Visit (HOSPITAL_COMMUNITY): Payer: Self-pay | Admitting: Physician Assistant

## 2019-08-21 ENCOUNTER — Ambulatory Visit (HOSPITAL_COMMUNITY)
Admission: RE | Admit: 2019-08-21 | Discharge: 2019-08-21 | Disposition: A | Discharge status: Home / Self Care | Payer: Medicare Other | Source: Ambulatory Visit | Attending: Interventional Radiology | Admitting: Interventional Radiology

## 2019-08-21 ENCOUNTER — Other Ambulatory Visit (HOSPITAL_COMMUNITY): Payer: Self-pay | Admitting: Interventional Radiology

## 2019-08-21 ENCOUNTER — Encounter (HOSPITAL_COMMUNITY): Payer: Self-pay

## 2019-08-21 DIAGNOSIS — X58XXXA Exposure to other specified factors, initial encounter: Secondary | ICD-10-CM | POA: Insufficient documentation

## 2019-08-21 DIAGNOSIS — G709 Myoneural disorder, unspecified: Secondary | ICD-10-CM | POA: Diagnosis not present

## 2019-08-21 DIAGNOSIS — S32010A Wedge compression fracture of first lumbar vertebra, initial encounter for closed fracture: Secondary | ICD-10-CM

## 2019-08-21 DIAGNOSIS — B1920 Unspecified viral hepatitis C without hepatic coma: Secondary | ICD-10-CM | POA: Diagnosis not present

## 2019-08-21 DIAGNOSIS — Z79899 Other long term (current) drug therapy: Secondary | ICD-10-CM | POA: Insufficient documentation

## 2019-08-21 DIAGNOSIS — Z79890 Hormone replacement therapy (postmenopausal): Secondary | ICD-10-CM | POA: Insufficient documentation

## 2019-08-21 DIAGNOSIS — N183 Chronic kidney disease, stage 3 unspecified: Secondary | ICD-10-CM | POA: Insufficient documentation

## 2019-08-21 DIAGNOSIS — F17210 Nicotine dependence, cigarettes, uncomplicated: Secondary | ICD-10-CM | POA: Diagnosis not present

## 2019-08-21 DIAGNOSIS — Z8249 Family history of ischemic heart disease and other diseases of the circulatory system: Secondary | ICD-10-CM | POA: Insufficient documentation

## 2019-08-21 DIAGNOSIS — E079 Disorder of thyroid, unspecified: Secondary | ICD-10-CM | POA: Insufficient documentation

## 2019-08-21 DIAGNOSIS — I129 Hypertensive chronic kidney disease with stage 1 through stage 4 chronic kidney disease, or unspecified chronic kidney disease: Secondary | ICD-10-CM | POA: Insufficient documentation

## 2019-08-21 DIAGNOSIS — M899 Disorder of bone, unspecified: Secondary | ICD-10-CM | POA: Diagnosis not present

## 2019-08-21 DIAGNOSIS — Z888 Allergy status to other drugs, medicaments and biological substances status: Secondary | ICD-10-CM | POA: Diagnosis not present

## 2019-08-21 HISTORY — PX: IR VERTEBROPLASTY LUMBAR BX INC UNI/BIL INC/INJECT/IMAGING: IMG5516

## 2019-08-21 LAB — CBC
HCT: 37.7 % (ref 36.0–46.0)
Hemoglobin: 12.8 g/dL (ref 12.0–15.0)
MCH: 34.2 pg — ABNORMAL HIGH (ref 26.0–34.0)
MCHC: 34 g/dL (ref 30.0–36.0)
MCV: 100.8 fL — ABNORMAL HIGH (ref 80.0–100.0)
Platelets: 149 10*3/uL — ABNORMAL LOW (ref 150–400)
RBC: 3.74 MIL/uL — ABNORMAL LOW (ref 3.87–5.11)
RDW: 13.5 % (ref 11.5–15.5)
WBC: 6.2 10*3/uL (ref 4.0–10.5)
nRBC: 0 % (ref 0.0–0.2)

## 2019-08-21 LAB — URINALYSIS, COMPLETE (UACMP) WITH MICROSCOPIC
Bilirubin Urine: NEGATIVE
Glucose, UA: NEGATIVE mg/dL
Hgb urine dipstick: NEGATIVE
Ketones, ur: NEGATIVE mg/dL
Nitrite: NEGATIVE
Protein, ur: NEGATIVE mg/dL
Specific Gravity, Urine: 1.018 (ref 1.005–1.030)
pH: 5 (ref 5.0–8.0)

## 2019-08-21 LAB — BASIC METABOLIC PANEL
Anion gap: 12 (ref 5–15)
BUN: 30 mg/dL — ABNORMAL HIGH (ref 6–20)
CO2: 22 mmol/L (ref 22–32)
Calcium: 9.6 mg/dL (ref 8.9–10.3)
Chloride: 103 mmol/L (ref 98–111)
Creatinine, Ser: 1.56 mg/dL — ABNORMAL HIGH (ref 0.44–1.00)
GFR calc Af Amer: 43 mL/min — ABNORMAL LOW (ref >60)
GFR calc non Af Amer: 37 mL/min — ABNORMAL LOW (ref >60)
Glucose, Bld: 85 mg/dL (ref 70–99)
Potassium: 4.8 mmol/L (ref 3.5–5.1)
Sodium: 137 mmol/L (ref 135–145)

## 2019-08-21 LAB — PROTIME-INR
INR: 0.9 (ref 0.8–1.2)
Prothrombin Time: 12.5 seconds (ref 11.4–15.2)

## 2019-08-21 MED ORDER — HYDROMORPHONE HCL 1 MG/ML IJ SOLN
INTRAMUSCULAR | Status: AC
  Filled 2019-08-21: qty 1

## 2019-08-21 MED ORDER — TOBRAMYCIN SULFATE 1.2 G IJ SOLR
INTRAMUSCULAR | Status: AC
  Filled 2019-08-21: qty 1.2

## 2019-08-21 MED ORDER — OXYCODONE HCL 5 MG PO TABS: 5.0000 mg | ORAL_TABLET | ORAL | 0 refills | Status: DC | PRN

## 2019-08-21 MED ORDER — FENTANYL CITRATE (PF) 100 MCG/2ML IJ SOLN
INTRAMUSCULAR | Status: AC | PRN
  Administered 2019-08-21 (×6): 25 ug via INTRAVENOUS

## 2019-08-21 MED ORDER — CEFAZOLIN (ANCEF) 1 G IV SOLR
INTRAVENOUS | Status: AC | PRN
  Administered 2019-08-21: 2 g

## 2019-08-21 MED ORDER — SODIUM CHLORIDE 0.9 % IV SOLN: INTRAVENOUS | Status: AC

## 2019-08-21 MED ORDER — CEFAZOLIN SODIUM-DEXTROSE 2-4 GM/100ML-% IV SOLN: 2.0000 g | INTRAVENOUS | Status: DC

## 2019-08-21 MED ORDER — HYDROMORPHONE HCL 1 MG/ML IJ SOLN
INTRAMUSCULAR | Status: AC | PRN
  Administered 2019-08-21: 1 mg via INTRAVENOUS

## 2019-08-21 MED ORDER — CEFAZOLIN SODIUM-DEXTROSE 2-4 GM/100ML-% IV SOLN
INTRAVENOUS | Status: AC
  Filled 2019-08-21: qty 100

## 2019-08-21 MED ORDER — BUPIVACAINE HCL (PF) 0.5 % IJ SOLN
INTRAMUSCULAR | Status: AC | PRN
  Administered 2019-08-21: 15 mL

## 2019-08-21 MED ORDER — BUPIVACAINE HCL (PF) 0.5 % IJ SOLN
INTRAMUSCULAR | Status: AC
  Filled 2019-08-21: qty 30

## 2019-08-21 MED ORDER — FENTANYL CITRATE (PF) 100 MCG/2ML IJ SOLN
INTRAMUSCULAR | Status: AC
  Filled 2019-08-21: qty 2

## 2019-08-21 MED ORDER — MIDAZOLAM HCL 2 MG/2ML IJ SOLN
INTRAMUSCULAR | Status: AC | PRN
  Administered 2019-08-21: 0.5 mg via INTRAVENOUS
  Administered 2019-08-21 (×3): 1 mg via INTRAVENOUS

## 2019-08-21 MED ORDER — SODIUM CHLORIDE 0.9 % IV SOLN
INTRAVENOUS | Status: AC | PRN
  Administered 2019-08-21 (×2): 250 mL via INTRAVENOUS

## 2019-08-21 MED ORDER — MIDAZOLAM HCL 2 MG/2ML IJ SOLN
INTRAMUSCULAR | Status: AC
  Filled 2019-08-21: qty 2

## 2019-08-21 MED ORDER — GELATIN ABSORBABLE 12-7 MM EX MISC
CUTANEOUS | Status: AC
  Filled 2019-08-21: qty 1

## 2019-08-21 MED ORDER — SODIUM CHLORIDE 0.9 % IV SOLN
INTRAVENOUS | Status: DC
  Administered 2019-08-21: 11:00:00 via INTRAVENOUS

## 2019-08-21 MED ORDER — IOHEXOL 300 MG/ML  SOLN
50.0000 mL | Freq: Once | INTRAMUSCULAR | Status: AC | PRN
  Administered 2019-08-21: 14:00:00 5 mL

## 2019-08-21 NOTE — Progress Notes (Signed)
Pt up in room , States she has to go home now.  Explained the reasons she needed to stay to be observed.  Pt states she "has to pee"  Pure wick has been provided and explained many times.  Pt states she is very upset because" the radiology nurses did not give me any pain medicine that the Dr told them to.  States she watched them and the bottles were still half full and she didn't feel any different.  I attempted to explain to her that we do not administer the whole bottle at once but pt would not hear that.  She calmed down and laid back in the bed.

## 2019-08-21 NOTE — Discharge Instructions (Signed)
1.No stooping,bending or lifting more than 10 lbs for 2 weeks. 2.Use walker to ambulate for 2 weeks. 3.No driving for 2 weeks. 4.Check with Dr Arvil Persons office for results of the biopsy  KYPHOPLASTY/VERTEBROPLASTY DISCHARGE INSTRUCTIONS  Medications: (check all that apply)     Resume all home medications as before procedure.       Resume your (aspirin/Plavix/Coumadin) on ***.                  Continue your pain medications as prescribed as needed.  Over the next 3-5 days, decrease your pain medication as tolerated.  Over the counter medications (i.e. Tylenol, ibuprofen, and aleve) may be substituted once severe/moderate pain symptoms have subsided.   Wound Care: - Bandages may be removed the day following your procedure.  You may get your incision wet once bandages are removed.  Bandaids may be used to cover the incisions until scab formation.  Topical ointments are optional.  - If you develop a fever greater than 101 degrees, have increased skin redness at the incision sites or pus-like oozing from incisions occurring within 1 week of the procedure, contact radiology at [phone] or [phone].  - Ice pack to back for 15-20 minutes 2-3 time per day for first 2-3 days post procedure.  The ice will expedite muscle healing and help with the pain from the incisions.   Activity: - Bedrest today with limited activity for 24 hours post procedure.  - No driving for 48 hours.  - Increase your activity as tolerated after bedrest (with assistance if necessary).  - Refrain from any strenuous activity or heavy lifting (greater than 10 lbs.).   Follow up: - Contact radiology at [phone] or [phone] if any questions/concerns.  - A physician assistant from radiology will contact you in approximately 1 week.  - If a biopsy was performed at the time of your procedure, your referring physician should receive the results in usually 2-3 days.

## 2019-08-21 NOTE — Procedures (Signed)
S/P L1 VP with biopsy
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
## 2019-08-22 ENCOUNTER — Encounter (HOSPITAL_COMMUNITY): Payer: Self-pay | Admitting: Interventional Radiology

## 2019-08-23 ENCOUNTER — Other Ambulatory Visit: Payer: Self-pay | Admitting: Specialist

## 2019-08-23 LAB — SURGICAL PATHOLOGY

## 2019-08-23 MED ORDER — OXYCODONE HCL 5 MG PO TABS: 5.0000 mg | ORAL_TABLET | ORAL | 0 refills | Status: DC | PRN

## 2019-08-23 NOTE — Telephone Encounter (Signed)
Sent to Dr. Nitka ° ° °

## 2019-08-23 NOTE — Telephone Encounter (Signed)
Patient called and advised per Dr Estanislado Pandy wanted Dr Louanne Skye to write a Rx for (Oxycodone) 5 mg for 1 wk. The number to contact patient is [phone]

## 2019-08-26 LAB — AEROBIC/ANAEROBIC CULTURE W GRAM STAIN (SURGICAL/DEEP WOUND): Culture: NO GROWTH

## 2019-08-30 ENCOUNTER — Telehealth: Payer: Self-pay | Admitting: *Deleted

## 2019-08-30 ENCOUNTER — Ambulatory Visit (INDEPENDENT_AMBULATORY_CARE_PROVIDER_SITE_OTHER): Payer: Medicare Other | Admitting: Neurology

## 2019-08-30 ENCOUNTER — Encounter: Payer: Self-pay | Admitting: Neurology

## 2019-08-30 ENCOUNTER — Other Ambulatory Visit: Payer: Self-pay

## 2019-08-30 ENCOUNTER — Telehealth: Payer: Self-pay | Admitting: Neurology

## 2019-08-30 VITALS — BP 111/80 | HR 73 | Temp 97.3°F | Ht 64.0 in | Wt 154.0 lb

## 2019-08-30 DIAGNOSIS — R200 Anesthesia of skin: Secondary | ICD-10-CM

## 2019-08-30 DIAGNOSIS — R410 Disorientation, unspecified: Secondary | ICD-10-CM | POA: Diagnosis not present

## 2019-08-30 MED ORDER — METHOCARBAMOL 500 MG PO TABS: 500.0000 mg | ORAL_TABLET | Freq: Four times a day (QID) | ORAL | 3 refills | Status: DC | PRN

## 2019-08-30 NOTE — Telephone Encounter (Signed)
PA for methocarbamol started via covemymeds (key: [id number]).  Pt has coverage with [employer] [phone]).  Pt [id number].  Decision pending.
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
## 2019-08-30 NOTE — Telephone Encounter (Signed)
[employer] medicare/medicaid order sent to [facility]. No auth they will reach out to the patient to schedule.  °

## 2019-08-30 NOTE — Telephone Encounter (Addendum)
Approved through 10/24/2020.  [id number].  [facility] notified and will get rx ready for pick up.

## 2019-08-31 ENCOUNTER — Ambulatory Visit: Payer: Medicare Other | Admitting: Specialist

## 2019-08-31 LAB — VITAMIN B12: Vitamin B-12: 575 pg/mL (ref 232–1245)

## 2019-08-31 LAB — HEMOGLOBIN A1C
Est. average glucose Bld gHb Est-mCnc: 108 mg/dL
Hgb A1c MFr Bld: 5.4 % (ref 4.8–5.6)

## 2019-08-31 LAB — TSH: TSH: 0.171 u[IU]/mL — ABNORMAL LOW (ref 0.450–4.500)

## 2019-08-31 LAB — RPR: RPR Ser Ql: NONREACTIVE

## 2019-08-31 LAB — ANA W/REFLEX IF POSITIVE: Anti Nuclear Antibody (ANA): NEGATIVE

## 2019-08-31 LAB — HIV ANTIBODY (ROUTINE TESTING W REFLEX): HIV Screen 4th Generation wRfx: NONREACTIVE

## 2019-09-03 ENCOUNTER — Telehealth: Payer: Self-pay | Admitting: Neurology

## 2019-09-03 NOTE — Telephone Encounter (Signed)
Please call patient, laboratory evaluation showed decreased TSH 0.17, indicating hyperthyroidism, she may contact her primary care physician for readjustment of her thyroid supplement  Rest of the laboratory evaluation showed no significant abnormalities

## 2019-09-03 NOTE — Telephone Encounter (Addendum)
I spoke to the patient and she verbalized understanding of her lab results.  All lab results have been faxed and confirmed to her PCP, Romeo Rabon, PA.  A copy of her TSH has mailed to the patient's home address.  She will contact her PCP to follow up.

## 2019-09-05 ENCOUNTER — Encounter: Payer: Medicare Other | Admitting: Neurology

## 2019-09-07 ENCOUNTER — Other Ambulatory Visit: Payer: Self-pay | Admitting: *Deleted

## 2019-09-07 NOTE — Patient Outreach (Signed)
[facility]) Care Management  09/07/2019  Raven Hansen [birth year] [id number]    Telephone Assessment-Successful-CKD  RN spoke with pt today and received an update on her ongoing management of care. Pt reports recent back surgery as she continue to recovery with no acute issues. Pt able to recite upcoming appointments and sufficient transportation. Pt states she received the BP device with scales from [facility] but unable to to tighten the grip for the BP and hs not read any of instructions. RN stongely her friend (neighbor) to assistance pt when needed with this task. Pt states she would prefer her provider's office assist with this demonstration and will make an appointment. Pt denies any BP issues and continue to take her medications with no delays and enough refills but would feel more comfortable with a demonstration. Pt inquired on help in the home as RN able to provider information such as [employer] if she qualified for this services. Also discuss out of pocket aide services if she does not qualify for coverage through her provider's office for this prescription with her [employer] of choice. Note pt does not a a skilled needed mention but adie services was her request. Pt does not wish to obtain detail information via [facility] SW who is currently involved but grateful for the information. Other discussion relates to pt's plan of care that was discussed and updated accordingly based upon pt's progress. Pt opt to decline the emergency medical alert system information that was sent via [facility] and states she will review the A.D packet and completed at some time when she recovers from her recent surgery. All interventions updated accordingly as RN continue to encouraged adherence with managing her care.   No other issues to address at this time with most questions that needs to be addressed by her provider. Pt verbalized an understanding and will pursue with her doctor. Note the updated plan of care  that will continue to be address next month on the next scheduled follow up call.  [facility] CM Care Plan Problem One     Most Recent Value  Care Plan Problem One  knowledge deficit related to chronic kidney disease  Role Documenting the Problem One  Care Management Telephonic Coordinator  Care Plan for Problem One  Active  [facility] Long Term Goal   patient will be able to state 3 things she can do to keep CKD from getting worse within 31 days  [facility] Long Term Goal Start Date  06/28/19  Interventions for Problem One Long Term Goal  Strongly encouraged pt ot review the educational material sent via [facility] and discussed several things to prevent CKD from getting worse. Will allow pt time to review and readdress this topic on the next call next month.   [facility] CM Short Term Goal #2   patient will report receiving/ securing blood pressure monitor and weigh scales within 30 days  [facility] CM Short Term Goal #2 Start Date  07/18/19  [facility] CM Short Term Goal #2 Met Date  09/07/19  Interventions for Short Term Goal #2  Unable to use at this time but wil consult with her provider    [facility] CM Care Plan Problem Three     Most Recent Value  Care Plan Problem Three  Patient request need for Advanced directive and lacks social support   Role Documenting the Problem Three  Care Management Telephonic [place] for Problem Three  Active  [facility] CM Short Term Goal #1   Patient will  report completing advance directive application within 30 days  [facility]N CM Short Term Goal #1 Start Date  07/18/19  Interventions for Short Term Goal #1  Will encouraged pt to complete this task over the next month. Will re-evaluate on the next call.Marland Kitchen  THN CM Short Term Goal #2   Patient will report receiving information on Medical alert support within 30 days.   [facility]N CM Short Term Goal #2 Start Date  07/18/19  [facility] CM Short Term Goal #2 Met Date  09/07/19      Raina Mina, RN Care Management Coordinator [place] Office  [phone]

## 2019-09-25 ENCOUNTER — Encounter (HOSPITAL_COMMUNITY): Payer: Self-pay | Admitting: Emergency Medicine

## 2019-09-25 ENCOUNTER — Inpatient Hospital Stay (HOSPITAL_COMMUNITY)
Admission: EM | Admit: 2019-09-25 | Discharge: 2019-09-27 | DRG: 202 | Disposition: A | Discharge status: Home Health | Payer: Medicare Other | Attending: Family Medicine | Admitting: Family Medicine

## 2019-09-25 ENCOUNTER — Emergency Department (HOSPITAL_COMMUNITY): Payer: Medicare Other

## 2019-09-25 DIAGNOSIS — F17210 Nicotine dependence, cigarettes, uncomplicated: Secondary | ICD-10-CM | POA: Diagnosis present

## 2019-09-25 DIAGNOSIS — J200 Acute bronchitis due to Mycoplasma pneumoniae: Secondary | ICD-10-CM | POA: Diagnosis not present

## 2019-09-25 DIAGNOSIS — F32A Depression, unspecified: Secondary | ICD-10-CM | POA: Diagnosis present

## 2019-09-25 DIAGNOSIS — Z79890 Hormone replacement therapy (postmenopausal): Secondary | ICD-10-CM | POA: Diagnosis not present

## 2019-09-25 DIAGNOSIS — J9601 Acute respiratory failure with hypoxia: Secondary | ICD-10-CM | POA: Diagnosis present

## 2019-09-25 DIAGNOSIS — B182 Chronic viral hepatitis C: Secondary | ICD-10-CM | POA: Diagnosis not present

## 2019-09-25 DIAGNOSIS — N189 Chronic kidney disease, unspecified: Secondary | ICD-10-CM | POA: Diagnosis present

## 2019-09-25 DIAGNOSIS — M549 Dorsalgia, unspecified: Secondary | ICD-10-CM | POA: Diagnosis present

## 2019-09-25 DIAGNOSIS — Z79899 Other long term (current) drug therapy: Secondary | ICD-10-CM

## 2019-09-25 DIAGNOSIS — Z8249 Family history of ischemic heart disease and other diseases of the circulatory system: Secondary | ICD-10-CM | POA: Diagnosis not present

## 2019-09-25 DIAGNOSIS — Q613 Polycystic kidney, unspecified: Secondary | ICD-10-CM | POA: Diagnosis not present

## 2019-09-25 DIAGNOSIS — Z833 Family history of diabetes mellitus: Secondary | ICD-10-CM

## 2019-09-25 DIAGNOSIS — I129 Hypertensive chronic kidney disease with stage 1 through stage 4 chronic kidney disease, or unspecified chronic kidney disease: Secondary | ICD-10-CM | POA: Diagnosis present

## 2019-09-25 DIAGNOSIS — J441 Chronic obstructive pulmonary disease with (acute) exacerbation: Secondary | ICD-10-CM

## 2019-09-25 DIAGNOSIS — Z8619 Personal history of other infectious and parasitic diseases: Secondary | ICD-10-CM | POA: Diagnosis not present

## 2019-09-25 DIAGNOSIS — N183 Chronic kidney disease, stage 3 unspecified: Secondary | ICD-10-CM | POA: Diagnosis present

## 2019-09-25 DIAGNOSIS — Z800 Family history of malignant neoplasm of digestive organs: Secondary | ICD-10-CM | POA: Diagnosis not present

## 2019-09-25 DIAGNOSIS — K219 Gastro-esophageal reflux disease without esophagitis: Secondary | ICD-10-CM | POA: Diagnosis present

## 2019-09-25 DIAGNOSIS — I10 Essential (primary) hypertension: Secondary | ICD-10-CM

## 2019-09-25 DIAGNOSIS — F418 Other specified anxiety disorders: Secondary | ICD-10-CM | POA: Diagnosis present

## 2019-09-25 DIAGNOSIS — Z720 Tobacco use: Secondary | ICD-10-CM | POA: Diagnosis not present

## 2019-09-25 DIAGNOSIS — J209 Acute bronchitis, unspecified: Secondary | ICD-10-CM | POA: Diagnosis present

## 2019-09-25 DIAGNOSIS — E039 Hypothyroidism, unspecified: Secondary | ICD-10-CM | POA: Diagnosis present

## 2019-09-25 DIAGNOSIS — G8929 Other chronic pain: Secondary | ICD-10-CM | POA: Diagnosis present

## 2019-09-25 DIAGNOSIS — M545 Low back pain: Secondary | ICD-10-CM | POA: Diagnosis present

## 2019-09-25 DIAGNOSIS — M5412 Radiculopathy, cervical region: Secondary | ICD-10-CM | POA: Diagnosis present

## 2019-09-25 DIAGNOSIS — N179 Acute kidney failure, unspecified: Secondary | ICD-10-CM | POA: Diagnosis present

## 2019-09-25 DIAGNOSIS — R0902 Hypoxemia: Secondary | ICD-10-CM

## 2019-09-25 DIAGNOSIS — Z8543 Personal history of malignant neoplasm of ovary: Secondary | ICD-10-CM | POA: Diagnosis not present

## 2019-09-25 DIAGNOSIS — N1832 Chronic kidney disease, stage 3b: Secondary | ICD-10-CM | POA: Diagnosis not present

## 2019-09-25 DIAGNOSIS — Z91030 Bee allergy status: Secondary | ICD-10-CM

## 2019-09-25 DIAGNOSIS — Z66 Do not resuscitate: Secondary | ICD-10-CM | POA: Diagnosis present

## 2019-09-25 DIAGNOSIS — F329 Major depressive disorder, single episode, unspecified: Secondary | ICD-10-CM | POA: Diagnosis present

## 2019-09-25 DIAGNOSIS — Z20828 Contact with and (suspected) exposure to other viral communicable diseases: Secondary | ICD-10-CM | POA: Diagnosis present

## 2019-09-25 DIAGNOSIS — R569 Unspecified convulsions: Secondary | ICD-10-CM

## 2019-09-25 DIAGNOSIS — Z888 Allergy status to other drugs, medicaments and biological substances status: Secondary | ICD-10-CM | POA: Diagnosis not present

## 2019-09-25 DIAGNOSIS — E782 Mixed hyperlipidemia: Secondary | ICD-10-CM | POA: Diagnosis present

## 2019-09-25 DIAGNOSIS — Z981 Arthrodesis status: Secondary | ICD-10-CM

## 2019-09-25 DIAGNOSIS — R0602 Shortness of breath: Secondary | ICD-10-CM | POA: Diagnosis present

## 2019-09-25 DIAGNOSIS — F419 Anxiety disorder, unspecified: Secondary | ICD-10-CM | POA: Diagnosis present

## 2019-09-25 DIAGNOSIS — B1920 Unspecified viral hepatitis C without hepatic coma: Secondary | ICD-10-CM | POA: Diagnosis present

## 2019-09-25 LAB — CBC
HCT: 38.2 % (ref 36.0–46.0)
HCT: 36.9 % (ref 36.0–46.0)
Hemoglobin: 12.8 g/dL (ref 12.0–15.0)
Hemoglobin: 12.7 g/dL (ref 12.0–15.0)
MCH: 34 pg (ref 26.0–34.0)
MCH: 34.1 pg — ABNORMAL HIGH (ref 26.0–34.0)
MCHC: 33.5 g/dL (ref 30.0–36.0)
MCHC: 34.4 g/dL (ref 30.0–36.0)
MCV: 101.3 fL — ABNORMAL HIGH (ref 80.0–100.0)
MCV: 99.2 fL (ref 80.0–100.0)
Platelets: 192 10*3/uL (ref 150–400)
Platelets: 175 10*3/uL (ref 150–400)
RBC: 3.77 MIL/uL — ABNORMAL LOW (ref 3.87–5.11)
RBC: 3.72 MIL/uL — ABNORMAL LOW (ref 3.87–5.11)
RDW: 13.6 % (ref 11.5–15.5)
RDW: 13.4 % (ref 11.5–15.5)
WBC: 15.9 10*3/uL — ABNORMAL HIGH (ref 4.0–10.5)
WBC: 16.5 10*3/uL — ABNORMAL HIGH (ref 4.0–10.5)
nRBC: 0 % (ref 0.0–0.2)
nRBC: 0 % (ref 0.0–0.2)

## 2019-09-25 LAB — BASIC METABOLIC PANEL
Anion gap: 15 (ref 5–15)
BUN: 52 mg/dL — ABNORMAL HIGH (ref 6–20)
CO2: 17 mmol/L — ABNORMAL LOW (ref 22–32)
Calcium: 10.3 mg/dL (ref 8.9–10.3)
Chloride: 103 mmol/L (ref 98–111)
Creatinine, Ser: 1.89 mg/dL — ABNORMAL HIGH (ref 0.44–1.00)
GFR calc Af Amer: 34 mL/min — ABNORMAL LOW (ref >60)
GFR calc non Af Amer: 29 mL/min — ABNORMAL LOW (ref >60)
Glucose, Bld: 109 mg/dL — ABNORMAL HIGH (ref 70–99)
Potassium: 4.5 mmol/L (ref 3.5–5.1)
Sodium: 135 mmol/L (ref 135–145)

## 2019-09-25 LAB — I-STAT BETA HCG BLOOD, ED (MC, WL, AP ONLY): I-stat hCG, quantitative: <5 m[IU]/mL (ref <5)

## 2019-09-25 LAB — LACTIC ACID, PLASMA
Lactic Acid, Venous: 0.9 mmol/L (ref 0.5–1.9)
Lactic Acid, Venous: 2.6 mmol/L (ref 0.5–1.9)

## 2019-09-25 LAB — TSH: TSH: 0.115 u[IU]/mL — ABNORMAL LOW (ref 0.350–4.500)

## 2019-09-25 LAB — CREATININE, SERUM
Creatinine, Ser: 1.72 mg/dL — ABNORMAL HIGH (ref 0.44–1.00)
GFR calc Af Amer: 38 mL/min — ABNORMAL LOW (ref >60)
GFR calc non Af Amer: 33 mL/min — ABNORMAL LOW (ref >60)

## 2019-09-25 LAB — HIV ANTIBODY (ROUTINE TESTING W REFLEX): HIV Screen 4th Generation wRfx: NONREACTIVE

## 2019-09-25 LAB — PROCALCITONIN: Procalcitonin: <0.1 ng/mL

## 2019-09-25 LAB — PHOSPHORUS: Phosphorus: 5.9 mg/dL — ABNORMAL HIGH (ref 2.5–4.6)

## 2019-09-25 LAB — TROPONIN I (HIGH SENSITIVITY): Troponin I (High Sensitivity): 2 ng/L (ref <18)

## 2019-09-25 LAB — MAGNESIUM: Magnesium: 3 mg/dL — ABNORMAL HIGH (ref 1.7–2.4)

## 2019-09-25 MED ORDER — METHYLPREDNISOLONE SODIUM SUCC 125 MG IJ SOLR
60.0000 mg | Freq: Two times a day (BID) | INTRAMUSCULAR | Status: DC
  Administered 2019-09-26 – 2019-09-27 (×3): 60 mg via INTRAVENOUS
  Filled 2019-09-25 (×3): qty 2

## 2019-09-25 MED ORDER — HYDROCODONE-ACETAMINOPHEN 10-325 MG PO TABS
1.0000 | ORAL_TABLET | Freq: Four times a day (QID) | ORAL | Status: DC
  Administered 2019-09-25 – 2019-09-27 (×7): 1 via ORAL
  Filled 2019-09-25 (×7): qty 1

## 2019-09-25 MED ORDER — SODIUM CHLORIDE 0.9 % IV SOLN
INTRAVENOUS | Status: DC
  Administered 2019-09-25 – 2019-09-27 (×4): via INTRAVENOUS

## 2019-09-25 MED ORDER — METHYLPREDNISOLONE SODIUM SUCC 125 MG IJ SOLR
125.0000 mg | Freq: Once | INTRAMUSCULAR | Status: AC
  Administered 2019-09-25: 125 mg via INTRAVENOUS
  Filled 2019-09-25: qty 2

## 2019-09-25 MED ORDER — AEROCHAMBER PLUS FLO-VU MEDIUM MISC
1.0000 | Freq: Once | Status: AC
  Administered 2019-09-25: 1
  Filled 2019-09-25: qty 1

## 2019-09-25 MED ORDER — MAGNESIUM SULFATE 2 GM/50ML IV SOLN
2.0000 g | Freq: Once | INTRAVENOUS | Status: AC
  Administered 2019-09-25: 2 g via INTRAVENOUS
  Filled 2019-09-25: qty 50

## 2019-09-25 MED ORDER — ALBUTEROL (5 MG/ML) CONTINUOUS INHALATION SOLN
10.0000 mg/h | INHALATION_SOLUTION | Freq: Once | RESPIRATORY_TRACT | Status: AC
  Administered 2019-09-25: 10 mg/h via RESPIRATORY_TRACT
  Filled 2019-09-25: qty 20

## 2019-09-25 MED ORDER — IPRATROPIUM BROMIDE 0.02 % IN SOLN
0.5000 mg | Freq: Once | RESPIRATORY_TRACT | Status: AC
  Administered 2019-09-25: 0.5 mg via RESPIRATORY_TRACT
  Filled 2019-09-25: qty 2.5

## 2019-09-25 MED ORDER — ALBUTEROL SULFATE HFA 108 (90 BASE) MCG/ACT IN AERS
8.0000 | INHALATION_SPRAY | Freq: Once | RESPIRATORY_TRACT | Status: AC
  Administered 2019-09-25: 8 via RESPIRATORY_TRACT
  Filled 2019-09-25: qty 6.7

## 2019-09-25 MED ORDER — ONDANSETRON HCL 4 MG PO TABS: 4.0000 mg | ORAL_TABLET | Freq: Four times a day (QID) | ORAL | Status: DC | PRN

## 2019-09-25 MED ORDER — ACETAMINOPHEN 650 MG RE SUPP: 650.0000 mg | Freq: Four times a day (QID) | RECTAL | Status: DC | PRN

## 2019-09-25 MED ORDER — ACETAMINOPHEN 325 MG PO TABS: 650.0000 mg | ORAL_TABLET | Freq: Four times a day (QID) | ORAL | Status: DC | PRN

## 2019-09-25 MED ORDER — IPRATROPIUM-ALBUTEROL 0.5-2.5 (3) MG/3ML IN SOLN: 3.0000 mL | RESPIRATORY_TRACT | Status: DC | PRN

## 2019-09-25 MED ORDER — ONDANSETRON HCL 4 MG/2ML IJ SOLN: 4.0000 mg | Freq: Four times a day (QID) | INTRAMUSCULAR | Status: DC | PRN

## 2019-09-25 MED ORDER — ENOXAPARIN SODIUM 40 MG/0.4ML ~~LOC~~ SOLN
40.0000 mg | SUBCUTANEOUS | Status: DC
  Administered 2019-09-25 – 2019-09-26 (×2): 40 mg via SUBCUTANEOUS
  Filled 2019-09-25 (×2): qty 0.4

## 2019-09-25 MED ORDER — AZITHROMYCIN 500 MG PO TABS
500.0000 mg | ORAL_TABLET | Freq: Every day | ORAL | Status: AC
  Administered 2019-09-25 – 2019-09-27 (×3): 500 mg via ORAL
  Filled 2019-09-25: qty 1
  Filled 2019-09-25: qty 2
  Filled 2019-09-25: qty 1
  Filled 2019-09-25: qty 2

## 2019-09-25 MED ORDER — IPRATROPIUM-ALBUTEROL 0.5-2.5 (3) MG/3ML IN SOLN
3.0000 mL | RESPIRATORY_TRACT | Status: DC
  Administered 2019-09-25 – 2019-09-27 (×8): 3 mL via RESPIRATORY_TRACT
  Filled 2019-09-25 (×9): qty 3

## 2019-09-25 MED ORDER — SODIUM CHLORIDE 0.9% FLUSH
3.0000 mL | Freq: Once | INTRAVENOUS | Status: AC
  Administered 2019-09-25: 3 mL via INTRAVENOUS

## 2019-09-25 NOTE — Progress Notes (Signed)
CRITICAL VALUE ALERT  Critical Value:  Lactic acid 2.6  Date & Time Notied:  09/25/19 2245  Provider Notified:  Verlee Monte  Orders Received/Actions taken:  Continue IV fluids

## 2019-09-25 NOTE — ED Notes (Signed)
Admitting paged per RN

## 2019-09-25 NOTE — H&P (Signed)
History and Physical    Raven Hansen [id number] DOB: November 25, 1960 DOA: 09/25/2019  PCP: Rolland Porter, PA-C  Patient coming from: Home  I have personally briefly reviewed patient's old medical records in [place]  Chief Complaint: Productive cough, shortness of breath, wheezing since 5 days  HPI: Raven Hansen is a 58 y.o. female with medical history significant of COPD, tobacco abuse, hypothyroidism, chronic back pain, CKD stage III, GERD presents to emergency department due to worsening productive cough, shortness of breath and wheezing since 5 days.  Patient reports productive cough with brownish-greenish sputum, no hemoptysis, associated with shortness of breath and wheezing.  She tells me that she went to urgent care on 11/27 and her COVID-19 and chest x-ray was negative and was discharged home on albuterol and doxycycline and promethazine.  Patient tells me that she taking doxycycline as prescribed however did not helped her symptoms.  Reports decreased appetite, nausea, headache however denies chest pain, blurry vision, lightheadedness, dizziness, leg swelling, palpitation, vomiting, generalized weakness or lethargy.  She tells me that she had back surgery last month and since then she is taking hydrocodone every 6 hours scheduled for chronic back pain.  She lives alone, quit smoking 5 days ago?,  Denies alcohol, illicit drug use.  Has history of hepatitis C-treated in 2015.  ED Course: Upon arrival: Patient tachypneic, hypoxic with oxygen saturation of 89%-placed on 2 L of oxygen via nasal cannula, CBC shows leukocytosis of 15.9, chest x-ray concerning for bronchitis, troponin negative, COVID-19 pending.  She received Solu-Medrol, magnesium sulfate and duo nebs in the ED.  Review of Systems: As per HPI otherwise negative.    Past Medical History:  Diagnosis Date  . Acid reflux   . Cancer ([place])    ovarian cancer  . Cataract   . Chronic kidney disease    Stage 3  . Colitis   . Compound fracture    L1  . COPD (chronic obstructive pulmonary disease) ([place])   . Hepatitis C   . Hypertension   . Neck fracture ([place])   . Neuromuscular disorder (HCC)    neuropathy  . Numbness and tingling   . Thyroid disease   . Tremor     Past Surgical History:  Procedure Laterality Date  . CHOLECYSTECTOMY    . FOOT SURGERY    . IR VERTEBROPLASTY LUMBAR BX INC UNI/BIL INC/INJECT/IMAGING  08/21/2019  . KNEE ARTHROSCOPY    . NECK SURGERY    . SHOULDER ARTHROSCOPY    . VAGINAL HYSTERECTOMY       reports that she has been smoking cigarettes. She has a 9.25 pack-year smoking history. She has never used smokeless tobacco. She reports current alcohol use. She reports that she does not use drugs.  Allergies  Allergen Reactions  . Bee Venom Swelling  . Meperidine Hcl Other (See Comments)    Headaches.  . Meperidine Other (See Comments)    headaches    Family History  Problem Relation Age of Onset  . Other Mother        unsure of history  . Diabetes Father   . Hypertension Father   . Colon cancer Father   . Hypertension Sister   . Hypertension Brother     Prior to Admission medications   Medication Sig Start Date End Date Taking? Authorizing Provider  albuterol (VENTOLIN HFA) 108 (90 Base) MCG/ACT inhaler inhale 2 puffs by mouth every 4 hours for wheezing 07/08/16   [provider]  atenolol (TENORMIN)  50 MG tablet Take 50 mg by mouth daily. 08/17/18   [provider]  cephALEXin (KEFLEX) 500 MG capsule Take 500 mg by mouth 4 (four) times daily.    [provider]  cholestyramine (QUESTRAN) 4 g packet MIX 1 PACKET as directed and take by mouth four times a day 07/13/18   [provider]  fluticasone (FLONASE) 50 MCG/ACT nasal spray INSTILL 1 SPRAY INTO EACH NOSTRIL TWICE A DAY 08/18/18   [provider]  ibuprofen (ADVIL) 800 MG tablet TAKE 1 TABLET(800 MG) BY MOUTH EVERY 8 HOURS AS NEEDED 05/18/19    Petrarca, Mike Craze, PA-C  levothyroxine (SYNTHROID) 137 MCG tablet Take 137 mcg by mouth daily before breakfast.    [provider]  lisinopril-hydrochlorothiazide (PRINZIDE,ZESTORETIC) 20-25 MG tablet TK 1 T PO QD 09/06/18   [provider]  methocarbamol (ROBAXIN) 500 MG tablet Take 1 tablet (500 mg total) by mouth 4 (four) times daily as needed for muscle spasms. 08/30/19   Marcial Pacas, MD  SUMAtriptan (IMITREX) 100 MG tablet Take 100 mg by mouth.    [provider]  valACYclovir (VALTREX) 1000 MG tablet Take 1,000 mg by mouth.    [provider]    Physical Exam: Vitals:   09/25/19 1644 09/25/19 1700 09/25/19 1730 09/25/19 1822  BP:  102/65 102/66   Pulse: 84 82 89   Resp: 16 16 13    Temp:      TempSrc:      SpO2:  100% 93% 93%    Constitutional: In mild respiratory distress, on 2 L of oxygen via nasal cannula, appears anxious and asking for pain medicines Eyes: PERRL, lids and conjunctivae normal ENMT: Mucous membranes are moist. Posterior pharynx clear of any exudate or lesions.Normal dentition.  Neck: normal, supple, no masses, no thyromegaly Respiratory: Diffuse bilateral inspiratory and expiratory wheezing noted.   Cardiovascular: Regular rate and rhythm, no murmurs / rubs / gallops. No extremity edema. 2+ pedal pulses. No carotid bruits.  Abdomen: no tenderness, no masses palpated. No hepatosplenomegaly. Bowel sounds positive.  Musculoskeletal: no clubbing / cyanosis. No joint deformity upper and lower extremities. Good ROM, no contractures. Normal muscle tone.  Skin: no rashes, lesions, ulcers. No induration Neurologic: CN 2-12 grossly intact. Sensation intact, DTR normal. Strength 5/5 in all 4.  Psychiatric: Normal judgment and insight. Alert and oriented x 3. Normal mood.    Labs on Admission: I have personally reviewed following labs and imaging studies  CBC: Recent Labs  Lab 09/25/19 1336  WBC 15.9*  HGB 12.8  HCT 38.2  MCV  101.3*  PLT [id number]   Basic Metabolic Panel: Recent Labs  Lab 09/25/19 1336  NA 135  K 4.5  CL 103  CO2 17*  GLUCOSE 109*  BUN 52*  CREATININE 1.89*  CALCIUM 10.3   GFR: CrCl cannot be calculated (Unknown ideal weight.). Liver Function Tests: No results for input(s): AST, ALT, ALKPHOS, BILITOT, PROT, ALBUMIN in the last 168 hours. No results for input(s): LIPASE, AMYLASE in the last 168 hours. No results for input(s): AMMONIA in the last 168 hours. Coagulation Profile: No results for input(s): INR, PROTIME in the last 168 hours. Cardiac Enzymes: No results for input(s): CKTOTAL, CKMB, CKMBINDEX, TROPONINI in the last 168 hours. BNP (last 3 results) No results for input(s): PROBNP in the last 8760 hours. HbA1C: No results for input(s): HGBA1C in the last 72 hours. CBG: No results for input(s): GLUCAP in the last 168 hours. Lipid Profile: No results for  input(s): CHOL, HDL, LDLCALC, TRIG, CHOLHDL, LDLDIRECT in the last 72 hours. Thyroid Function Tests: No results for input(s): TSH, T4TOTAL, FREET4, T3FREE, THYROIDAB in the last 72 hours. Anemia Panel: No results for input(s): VITAMINB12, FOLATE, FERRITIN, TIBC, IRON, RETICCTPCT in the last 72 hours. Urine analysis:    Component Value Date/Time   COLORURINE YELLOW 08/21/2019 1200   APPEARANCEUR CLEAR 08/21/2019 1200   LABSPEC 1.018 08/21/2019 1200   PHURINE 5.0 08/21/2019 1200   GLUCOSEU NEGATIVE 08/21/2019 1200   HGBUR NEGATIVE 08/21/2019 1200   BILIRUBINUR NEGATIVE 08/21/2019 1200   KETONESUR NEGATIVE 08/21/2019 1200   PROTEINUR NEGATIVE 08/21/2019 1200   NITRITE NEGATIVE 08/21/2019 1200   LEUKOCYTESUR MODERATE (A) 08/21/2019 1200    Radiological Exams on Admission: Dg Chest 2 View  Result Date: 09/25/2019 CLINICAL DATA:  Shortness of breath.  Productive cough. EXAM: CHEST - 2 VIEW COMPARISON:  09/21/2019. FINDINGS: Mediastinum hilar structures normal. Heart size normal. Mild peribronchial cuffing again noted  suggesting bronchitis. Pleural-parenchymal thickening consistent scarring. No acute alveolar infiltrate. No pleural effusion or pneumothorax. Prior cervical spine fusion. Thoracic spine scoliosis again noted. Prior upper lumbar vertebroplasty. Surgical clips upper abdomen. IMPRESSION: Mild peribronchial cuffing suggesting bronchitis again noted. Mild pleuroparenchymal thickening consistent scarring. Exam stable from prior exam. No acute infiltrate noted. Electronically Signed   By: Marcello Moores  Register   On: 09/25/2019 14:19    EKG:  Assessment/Plan Active Problems:   Essential hypertension   Hepatitis C   Hypothyroidism (acquired)   Tobacco use   COPD with acute exacerbation (HCC)   Chronic back pain   Acute on chronic kidney failure (HCC)   Acute respiratory failure with hypoxemia (HCC)    Acute hypoxic respiratory failure: -Secondary to COPD with acute exacerbation.  Patient presented with productive cough with brownish-green sputum, shortness of breath and wheezing.  Current smoker. -Patient is afebrile, tachypneic and hypoxic-on 2 L of oxygen via nasal cannula.  -Patient received loading dose of IV Solu-Medrol, magnesium sulfate and duo nebs in the ED. -Chest x-ray as above, COVID-19 pending, troponin: Negative -Will admit patient on the floor for close monitoring. -Check blood culture, sputum culture, procalcitonin level and lactic acid level -Continue IV Solu-Medrol twice daily, start on azithromycin 500 mg p.o. daily for 3 days, DuoNebs every 4 hours scheduled and every 2 hours as needed for shortness of breath and wheezing. -On continuous pulse ox-we will try to wean off of oxygen -Started on incentive spirometry.  AKI on CKD stage III: -Baseline creatinine: 1.56 trended up to 1.89, baseline GFR 43 trended down to 34 -Start on gentle hydration, avoid nephrotoxic medication and monitor blood pressure closely.  Hypothyroidism: Check TSH -Continue levothyroxine  Hypertension:  Blood pressure is stable -Hold lisinopril-HCTZ for now due to AKI on CKD. -Monitor blood pressure closely.  Chronic back pain: -She tells me that she takes Norco every 6 hours scheduled at home-confirmed with the pharmacy. -Continue Norco  Tobacco abuse: -Patient tells me that she quit smoking 5 days ago.  History of hepatitis C: Treated in 2015.  Unable to safely start patient's home medication as pharmacy med reconciliation is pending.  DVT prophylaxis: TED/SCD/Lovenox Code Status: DNR-confirmed with the patient Family Communication: None present at bedside.  Plan of care discussed with patient in length and she verbalized understanding and agreed with it. Disposition Plan: TBD Consults called: None Admission status: Inpatient   Mckinley Jewel MD [facility] Pager [phone]  If 7PM-7AM, please contact night-coverage [email or web address] Password [email or web address]  09/25/2019, 7:09  PM    

## 2019-09-25 NOTE — ED Notes (Signed)
Pt O2 saturation dropped to 89%. Pt placed on 2L nasal cannula.

## 2019-09-25 NOTE — ED Provider Notes (Signed)
[facility] EMERGENCY DEPARTMENT Provider Note   CSN: [id number] Arrival date & time: 09/25/19  1330     History   Chief Complaint Chief Complaint  Patient presents with   Shortness of Breath    HPI Raven Hansen is a 58 y.o. female with history of tobacco use, hepatitis C on medicines, hypothyroidism on levothyroxine presents to the ER for evaluation of worsening shortness of breath.  Onset approximately 1 week ago.  She went to urgent care on 11/27 and had a COVID-19 test and a chest x-ray, she was discharged with doxycycline and albuterol inhalers and promethazine for presumed bronchitis but this has not been helping.  She has persistent, gradually worsening cough with thick yellow and black sputum, chest tightness, wheezing, exertional shortness of breath, lightheadedness.  Reports posttussive emesis and also passing stool because she is coughing so hard.  Denies any fever, congestion, sore throat, chest pain, abdominal pain.  She smokes 1 pack of cigarettes every day but states for the last week she has been unable to smoke.  Her COVID-19 test came back negative on 11/27.  Denies formal work-up or diagnosis of COPD.  Denies history of chronic cough.  In the past she has had some wheezing with upper respiratory infections.  Denies body swelling, calf pain, chest pain, hemoptysis.  Approximately 1 month ago she had kyphoplasty by Dr Rosann Auerbach for chronic low back pain, but states she was ambulatory that same day.     HPI  Past Medical History:  Diagnosis Date   Acid reflux    Cancer (HCC)    ovarian cancer   Cataract    Chronic kidney disease    Stage 3   Colitis    Compound fracture    L1   COPD (chronic obstructive pulmonary disease) (HCC)    Hepatitis C    Hypertension    Neck fracture (HCC)    Neuromuscular disorder (HCC)    neuropathy   Numbness and tingling    Thyroid disease    Tremor     Patient Active Problem List   Diagnosis  Date Noted   Numbness 08/30/2019   Confusion 08/30/2019   Chondrocalcinosis due to dicalcium phosphate crystals, of the knee, right 04/12/2019   Chronic pain of both shoulders 01/30/2019   Bilateral primary osteoarthritis of knee 01/08/2019   Exposure to communicable disease 05/09/2018   Flying phobia 05/09/2018   H/O bee sting allergy 05/09/2018   Mixed hyperlipidemia 05/09/2018   Allergic contact dermatitis 04/14/2018   Preop general physical exam 12/14/2017   Mass of right hand 07/14/2017   Cat scratch of forearm, right, initial encounter 06/30/2017   Family history of breast cancer 06/22/2017   History of ovarian cancer 06/22/2017   Bipolar affective disorder in remission ([place]) 04/13/2017   Allergic dermatitis due to poison ivy 03/10/2017   Myofascial pain 03/10/2017   Acute bronchitis due to Mycoplasma pneumoniae 02/17/2017   Anemia 02/17/2017   Cervical radiculopathy 02/17/2017   Chronic pain syndrome 02/17/2017   Complete tear of anterior cruciate ligament of knee 02/17/2017   Dental infection 02/17/2017   Epidermal cyst 02/17/2017   Failed back surgical syndrome 02/17/2017   Hypoglycemia 02/17/2017   Lesion of nipple 02/17/2017   DJD (degenerative joint disease) of knee 02/17/2017   Petechiae 02/17/2017   Rotator cuff impingement syndrome of right shoulder 02/17/2017   Seizures ([place]) 02/17/2017   Anxiety and depression 01/19/2017   Cervical disc disease 01/19/2017   Chronic bronchitis ([place])  01/19/2017   Contusion of right great toe without damage to nail 01/19/2017   Migraine aura, persistent, intractable 01/19/2017   Viral upper respiratory tract infection 01/19/2017   Chronic diarrhea 01/14/2017   Bipolar 2 disorder, major depressive episode ([place]) 08/26/2016   Tobacco use disorder, severe, dependence 08/26/2016   Benzodiazepine dependence ([place]) 08/25/2016   Pain in right knee 07/02/2016   Dry eye syndrome 06/17/2016    Episode of recurrent major depressive disorder ([place]) 06/17/2016   Essential hypertension 06/17/2016   GERD without esophagitis 06/17/2016   Hepatitis C 06/17/2016   Herniated cervical disc 06/17/2016   Herpes simplex vulvovaginitis 06/17/2016   Hypothyroidism (acquired) 06/17/2016   Insomnia 06/17/2016   Malaise and fatigue 06/17/2016   Polycystic kidney disease 06/17/2016   Primary osteoarthritis involving multiple joints 06/17/2016   Allergic rhinitis 08/07/2014   Anxiety state 08/07/2014   Cough 08/07/2014   Other amnesia 08/07/2014   Other fatigue 08/07/2014   Severe episode of recurrent major depressive disorder, with psychotic features ([place]) 08/07/2014   Tobacco use 08/07/2014    Past Surgical History:  Procedure Laterality Date   CHOLECYSTECTOMY     FOOT SURGERY     IR VERTEBROPLASTY LUMBAR BX INC UNI/BIL INC/INJECT/IMAGING  08/21/2019   KNEE ARTHROSCOPY     NECK SURGERY     SHOULDER ARTHROSCOPY     VAGINAL HYSTERECTOMY       OB History   No obstetric history on file.      Home Medications    Prior to Admission medications   Medication Sig Start Date End Date Taking? Authorizing Provider  albuterol (VENTOLIN HFA) 108 (90 Base) MCG/ACT inhaler inhale 2 puffs by mouth every 4 hours for wheezing 07/08/16   [provider]  atenolol (TENORMIN) 50 MG tablet Take 50 mg by mouth daily. 08/17/18   [provider]  cephALEXin (KEFLEX) 500 MG capsule Take 500 mg by mouth 4 (four) times daily.    [provider]  cholestyramine (QUESTRAN) 4 g packet MIX 1 PACKET as directed and take by mouth four times a day 07/13/18   [provider]  fluticasone (FLONASE) 50 MCG/ACT nasal spray INSTILL 1 SPRAY INTO EACH NOSTRIL TWICE A DAY 08/18/18   [provider]  ibuprofen (ADVIL) 800 MG tablet TAKE 1 TABLET(800 MG) BY MOUTH EVERY 8 HOURS AS NEEDED 05/18/19   Petrarca, Mike Craze, PA-C  levothyroxine (SYNTHROID) 137 MCG  tablet Take 137 mcg by mouth daily before breakfast.    [provider]  lisinopril-hydrochlorothiazide (PRINZIDE,ZESTORETIC) 20-25 MG tablet TK 1 T PO QD 09/06/18   [provider]  methocarbamol (ROBAXIN) 500 MG tablet Take 1 tablet (500 mg total) by mouth 4 (four) times daily as needed for muscle spasms. 08/30/19   Marcial Pacas, MD  SUMAtriptan (IMITREX) 100 MG tablet Take 100 mg by mouth.    [provider]  valACYclovir (VALTREX) 1000 MG tablet Take 1,000 mg by mouth.    [provider]    Family History Family History  Problem Relation Age of Onset   Other Mother        unsure of history   Diabetes Father    Hypertension Father    Colon cancer Father    Hypertension Sister    Hypertension Brother     Social History Social History   Tobacco Use   Smoking status: Current Every Day Smoker    Packs/day: 0.25    Years: 37.00    Pack years: 9.25  Types: Cigarettes   Smokeless tobacco: Never Used  Substance Use Topics   Alcohol use: Yes    Comment: RARELY   Drug use: Never     Allergies   Bee venom, Meperidine hcl, and Meperidine   Review of Systems Review of Systems  Respiratory: Positive for cough, chest tightness, shortness of breath and wheezing.   Gastrointestinal: Positive for vomiting (Posttussive).  Neurological: Positive for light-headedness.  All other systems reviewed and are negative.    Physical Exam Updated Vital Signs BP 102/66    Pulse 89    Temp 99.8 F (37.7 C) (Oral)    Resp 13    SpO2 93%   Physical Exam Vitals signs and nursing note reviewed.  Constitutional:      General: She is not in acute distress.    Appearance: She is well-developed.     Comments: NAD.  Pleasant, alert.  HENT:     Head: Normocephalic and atraumatic.     Right Ear: External ear normal.     Left Ear: External ear normal.     Nose: Nose normal.     Mouth/Throat:     Comments: Moist mucous membranes.  Oropharynx and  tonsils normal. Eyes:     General: No scleral icterus.    Conjunctiva/sclera: Conjunctivae normal.  Neck:     Musculoskeletal: Normal range of motion and neck supple.  Cardiovascular:     Rate and Rhythm: Normal rate and regular rhythm.     Heart sounds: Normal heart sounds. No murmur.     Comments: No lower extremity edema.  No calf tenderness. Pulmonary:     Effort: Pulmonary effort is normal. Tachypnea present.     Breath sounds: Wheezing and rhonchi present.     Comments: Patient is speaking in full sentences at rest, SPO2 greater than 94% while sitting down in during conversation.  Frequent wet sounding cough during exam for several seconds.  Diffuse inspiratory and expiratory wheezing with some rhonchi in all lung fields.  I ambulated patient for 3 minutes and SPO2 dropped to 90%, her respiratory rate was between 25-38.  At the end of the walk she had increased work of breathing and was speaking in 2-3 word sentences, reported lightheadedness but no chest pain. Musculoskeletal: Normal range of motion.        General: No deformity.  Skin:    General: Skin is warm and dry.     Capillary Refill: Capillary refill takes less than 2 seconds.  Neurological:     Mental Status: She is alert and oriented to person, place, and time.  Psychiatric:        Behavior: Behavior normal.        Thought Content: Thought content normal.        Judgment: Judgment normal.      ED Treatments / Results  Labs (all labs ordered are listed, but only abnormal results are displayed) Labs Reviewed  BASIC METABOLIC PANEL - Abnormal; Notable for the following components:      Result Value   CO2 17 (*)    Glucose, Bld 109 (*)    BUN 52 (*)    Creatinine, Ser 1.89 (*)    GFR calc non Af Amer 29 (*)    GFR calc Af Amer 34 (*)    All other components within normal limits  CBC - Abnormal; Notable for the following components:   WBC 15.9 (*)    RBC 3.77 (*)    MCV 101.3 (*)  All other components  within normal limits  I-STAT BETA HCG BLOOD, ED ([facility], [facility], [facility] ONLY)  TROPONIN I (HIGH SENSITIVITY)    EKG EKG Interpretation  Date/Time:  Tuesday September 25 2019 13:46:42 EST Ventricular Rate:  96 PR Interval:  152 QRS Duration: 78 QT Interval:  350 QTC Calculation: 442 R Axis:   48 Text Interpretation: Normal sinus rhythm Nonspecific T wave abnormality Abnormal ECG No previous ECGs available Confirmed by Theotis Burrow [phone]) on 09/25/2019 4:06:57 PM   Radiology Dg Chest 2 View  Result Date: 09/25/2019 CLINICAL DATA:  Shortness of breath.  Productive cough. EXAM: CHEST - 2 VIEW COMPARISON:  09/21/2019. FINDINGS: Mediastinum hilar structures normal. Heart size normal. Mild peribronchial cuffing again noted suggesting bronchitis. Pleural-parenchymal thickening consistent scarring. No acute alveolar infiltrate. No pleural effusion or pneumothorax. Prior cervical spine fusion. Thoracic spine scoliosis again noted. Prior upper lumbar vertebroplasty. Surgical clips upper abdomen. IMPRESSION: Mild peribronchial cuffing suggesting bronchitis again noted. Mild pleuroparenchymal thickening consistent scarring. Exam stable from prior exam. No acute infiltrate noted. Electronically Signed   By: Marcello Moores  Register   On: 09/25/2019 14:19    Procedures .Critical Care Performed by: Kinnie Feil, PA-C Authorized by: Kinnie Feil, PA-C   Critical care provider statement:    Critical care time (minutes):  45   Critical care was necessary to treat or prevent imminent or life-threatening deterioration of the following conditions:  Respiratory failure (prolonged COPD exacerbation)   Critical care was time spent personally by me on the following activities:  Discussions with consultants, evaluation of patient's response to treatment, examination of patient, ordering and performing treatments and interventions, ordering and review of laboratory studies, ordering and review of radiographic studies,  pulse oximetry, re-evaluation of patient's condition, obtaining history from patient or surrogate, review of old charts and development of treatment plan with patient or surrogate   I assumed direction of critical care for this patient from another provider in my specialty: no     (including critical care time)  Medications Ordered in ED Medications  magnesium sulfate IVPB 2 g 50 mL (2 g Intravenous New Bag/Given 09/25/19 1655)  sodium chloride flush (NS) 0.9 % injection 3 mL (3 mLs Intravenous Given 09/25/19 1539)  methylPREDNISolone sodium succinate (SOLU-MEDROL) 125 mg/2 mL injection 125 mg (125 mg Intravenous Given 09/25/19 1657)  albuterol (VENTOLIN HFA) 108 (90 Base) MCG/ACT inhaler 8 puff (8 puffs Inhalation Given 09/25/19 1748)  AeroChamber Plus Flo-Vu Medium MISC 1 each (1 each Other Given 09/25/19 1644)  albuterol (PROVENTIL,VENTOLIN) solution continuous neb (10 mg/hr Nebulization Given 09/25/19 1644)  ipratropium (ATROVENT) nebulizer solution 0.5 mg (0.5 mg Nebulization Given 09/25/19 1644)     Initial Impression / Assessment and Plan / ED Course  I have reviewed the triage vital signs and the nursing notes.  Pertinent labs & imaging results that were available during my care of the patient were reviewed by me and considered in my medical decision making (see chart for details).  Clinical Course as of Sep 24 1752  Tue Sep 25, 2019  1632 Spoke to RT, requested duoneb/breathing tx.  She had negative test 11/27, RT ok to do duoneb. Ordered.    [CG]  [id number] Re-evaluated pt after medicines, albuterol 8 puffs, 1 hr breathing treatment.  She has persistent diffuse audible wheezing. Spo2 normal but increased work of breathing speaking in interrupted sentences.     [CG]    Clinical Course User Index [CG] Kinnie Feil, PA-C   EMR reviewed  to assist with MDM.  Urgent care visit on 11/27 reviewed.  She had negative antigen Covid test and negative follow-up Covid test.  Chest x-ray at  the time showed chronic bronchitis changes.  ER work-up initiated in triage reviewed and interpreted personally.  Leukocytosis WBC 15.9, mild elevation in creatinine 1.9 with GFR 29.  Chest x-ray with chronic bronchitis changes but no infiltrate, edema.  EKG is nonischemic.  Initial troponin is undetectable.  She has no chest pain and clinically this is unlikely to be ACS, I do not think repeat troponin is necessary.  Considered PE highly unlikely as she has no chest pain.  No history of heart failure hypervolemia on exam and CHF is unlikely in this setting.  Clinically, patient symptoms are most suggestive of viral lower respiratory infection, COPD exacerbation.  I personally ambulated patient for 3 minutes and SPO2 dropped to 90% for only a few seconds however patient had notable dyspnea and lightheadedness.  We will start IV magnesium, methylprednisone, breathing treatments and closely monitor.  Low threshold to admit if there is no clinical improvement.  1750: Patient has received to medicines, breathing treatments.  Upon reevaluation her respiratory status has not improved.  She has persistent audible wheezing.  SPO2 dropped to 89% at rest and now requiring 2 L [place].  She is stable at rest on 2 L.  Will discuss with medicine for admission of likely COPD exacerbation, borderline Spo2/hypoxia.  Documented negative Covid test on 11/27.  Discussed with EDP.   [id number]: Dr Doristine Bosworth to admit  Final Clinical Impressions(s) / ED Diagnoses   Final diagnoses:  Hypoxia    ED Discharge Orders    None       Kinnie Feil, PA-C 09/25/19 1804    Little, Wenda Overland, MD 09/25/19 2126

## 2019-09-25 NOTE — ED Triage Notes (Signed)
Pt states she was seen over the weekend for sob and tested negative for covid and diagnosed with bronchitis, however pt has became progressively short of breath.  Pt denies any CP. Pt has been having a productive cough

## 2019-09-26 ENCOUNTER — Encounter (HOSPITAL_COMMUNITY): Payer: Self-pay | Admitting: *Deleted

## 2019-09-26 DIAGNOSIS — J200 Acute bronchitis due to Mycoplasma pneumoniae: Secondary | ICD-10-CM

## 2019-09-26 LAB — CBC WITH DIFFERENTIAL/PLATELET
Abs Immature Granulocytes: 0.04 10*3/uL (ref 0.00–0.07)
Basophils Absolute: 0 10*3/uL (ref 0.0–0.1)
Basophils Relative: 0 %
Eosinophils Absolute: 0 10*3/uL (ref 0.0–0.5)
Eosinophils Relative: 0 %
HCT: 35 % — ABNORMAL LOW (ref 36.0–46.0)
Hemoglobin: 11.8 g/dL — ABNORMAL LOW (ref 12.0–15.0)
Immature Granulocytes: 0 %
Lymphocytes Relative: 12 %
Lymphs Abs: 1.4 10*3/uL (ref 0.7–4.0)
MCH: 33.9 pg (ref 26.0–34.0)
MCHC: 33.7 g/dL (ref 30.0–36.0)
MCV: 100.6 fL — ABNORMAL HIGH (ref 80.0–100.0)
Monocytes Absolute: 0.8 10*3/uL (ref 0.1–1.0)
Monocytes Relative: 7 %
Neutro Abs: 9.2 10*3/uL — ABNORMAL HIGH (ref 1.7–7.7)
Neutrophils Relative %: 81 %
Platelets: 178 10*3/uL (ref 150–400)
RBC: 3.48 MIL/uL — ABNORMAL LOW (ref 3.87–5.11)
RDW: 13.4 % (ref 11.5–15.5)
WBC: 11.5 10*3/uL — ABNORMAL HIGH (ref 4.0–10.5)
nRBC: 0 % (ref 0.0–0.2)

## 2019-09-26 LAB — COMPREHENSIVE METABOLIC PANEL
ALT: 21 U/L (ref 0–44)
AST: 25 U/L (ref 15–41)
Albumin: 3.7 g/dL (ref 3.5–5.0)
Alkaline Phosphatase: 75 U/L (ref 38–126)
Anion gap: 15 (ref 5–15)
BUN: 57 mg/dL — ABNORMAL HIGH (ref 6–20)
CO2: 16 mmol/L — ABNORMAL LOW (ref 22–32)
Calcium: 9.6 mg/dL (ref 8.9–10.3)
Chloride: 104 mmol/L (ref 98–111)
Creatinine, Ser: 1.61 mg/dL — ABNORMAL HIGH (ref 0.44–1.00)
GFR calc Af Amer: 41 mL/min — ABNORMAL LOW (ref >60)
GFR calc non Af Amer: 35 mL/min — ABNORMAL LOW (ref >60)
Glucose, Bld: 115 mg/dL — ABNORMAL HIGH (ref 70–99)
Potassium: 4.8 mmol/L (ref 3.5–5.1)
Sodium: 135 mmol/L (ref 135–145)
Total Bilirubin: 0.4 mg/dL (ref 0.3–1.2)
Total Protein: 6.7 g/dL (ref 6.5–8.1)

## 2019-09-26 LAB — EXPECTORATED SPUTUM ASSESSMENT W GRAM STAIN, RFLX TO RESP C

## 2019-09-26 LAB — SARS CORONAVIRUS 2 (TAT 6-24 HRS): SARS Coronavirus 2: NEGATIVE

## 2019-09-26 MED ORDER — PHENOL 1.4 % MT LIQD
1.0000 | OROMUCOSAL | Status: DC | PRN
  Administered 2019-09-26: 1 via OROMUCOSAL
  Filled 2019-09-26: qty 177

## 2019-09-26 MED ORDER — SUMATRIPTAN SUCCINATE 100 MG PO TABS
100.0000 mg | ORAL_TABLET | ORAL | Status: DC | PRN
  Filled 2019-09-26: qty 1

## 2019-09-26 MED ORDER — ALUM & MAG HYDROXIDE-SIMETH 200-200-20 MG/5ML PO SUSP
30.0000 mL | ORAL | Status: DC | PRN
  Administered 2019-09-26: 30 mL via ORAL
  Filled 2019-09-26: qty 30

## 2019-09-26 MED ORDER — LEVOTHYROXINE SODIUM 25 MCG PO TABS
137.0000 ug | ORAL_TABLET | Freq: Every day | ORAL | Status: DC
  Administered 2019-09-26 – 2019-09-27 (×2): 137 ug via ORAL
  Filled 2019-09-26 (×2): qty 1

## 2019-09-26 MED ORDER — ATENOLOL 50 MG PO TABS
50.0000 mg | ORAL_TABLET | Freq: Every day | ORAL | Status: DC
  Administered 2019-09-26 – 2019-09-27 (×2): 50 mg via ORAL
  Filled 2019-09-26 (×3): qty 1

## 2019-09-26 MED ORDER — HYDROXYZINE HCL 25 MG PO TABS
25.0000 mg | ORAL_TABLET | Freq: Three times a day (TID) | ORAL | Status: DC
  Administered 2019-09-26 – 2019-09-27 (×4): 25 mg via ORAL
  Filled 2019-09-26 (×4): qty 1

## 2019-09-26 MED ORDER — GUAIFENESIN-DM 100-10 MG/5ML PO SYRP
5.0000 mL | ORAL_SOLUTION | ORAL | Status: DC | PRN
  Administered 2019-09-26 – 2019-09-27 (×6): 5 mL via ORAL
  Filled 2019-09-26 (×6): qty 5

## 2019-09-26 MED ORDER — ESCITALOPRAM OXALATE 10 MG PO TABS
20.0000 mg | ORAL_TABLET | Freq: Every day | ORAL | Status: DC
  Administered 2019-09-26 – 2019-09-27 (×2): 20 mg via ORAL
  Filled 2019-09-26 (×2): qty 2

## 2019-09-26 MED ORDER — ORAL CARE MOUTH RINSE
15.0000 mL | Freq: Two times a day (BID) | OROMUCOSAL | Status: DC
  Administered 2019-09-27: 15 mL via OROMUCOSAL

## 2019-09-26 NOTE — Plan of Care (Signed)

## 2019-09-26 NOTE — Progress Notes (Signed)
No additional orders for lactic acid levels to be drawn. Paged provider who stated he did not want any additional labs drawn.
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
## 2019-09-26 NOTE — Progress Notes (Signed)
PT Cancellation Note  Patient Details Name: Raven Hansen MRN: [id number] DOB: 09-08-1961   Cancelled Treatment:    Reason Eval/Treat Not Completed: Medical issues which prohibited therapy   Attempted PT in am and pm.  Both attempts pt with coughing episode and anxiousness , unable to participate.  In pm, spent 20 mins in room due to pt anxious, difficulty breathing, coughing.  Turned temp down, fanned pt, called RN to notify of request for breathing tx/inhaler.  Pt coughing and breathing eased but still unable to participate.  Sats were 94%.  Will f/u as able.    Mikael Spray Hesper [place]lla 09/26/2019, 2:42 PM

## 2019-09-27 ENCOUNTER — Other Ambulatory Visit: Payer: Self-pay

## 2019-09-27 ENCOUNTER — Other Ambulatory Visit: Payer: Medicare Other

## 2019-09-27 ENCOUNTER — Encounter (HOSPITAL_COMMUNITY): Payer: Self-pay | Admitting: General Practice

## 2019-09-27 DIAGNOSIS — R0902 Hypoxemia: Secondary | ICD-10-CM | POA: Diagnosis not present

## 2019-09-27 LAB — CBC WITH DIFFERENTIAL/PLATELET
Abs Immature Granulocytes: 0.05 10*3/uL (ref 0.00–0.07)
Basophils Absolute: 0.1 10*3/uL (ref 0.0–0.1)
Basophils Relative: 1 %
Eosinophils Absolute: 0.4 10*3/uL (ref 0.0–0.5)
Eosinophils Relative: 3 %
HCT: 34.1 % — ABNORMAL LOW (ref 36.0–46.0)
Hemoglobin: 11.4 g/dL — ABNORMAL LOW (ref 12.0–15.0)
Immature Granulocytes: 0 %
Lymphocytes Relative: 21 %
Lymphs Abs: 2.6 10*3/uL (ref 0.7–4.0)
MCH: 33.7 pg (ref 26.0–34.0)
MCHC: 33.4 g/dL (ref 30.0–36.0)
MCV: 100.9 fL — ABNORMAL HIGH (ref 80.0–100.0)
Monocytes Absolute: 1.1 10*3/uL — ABNORMAL HIGH (ref 0.1–1.0)
Monocytes Relative: 9 %
Neutro Abs: 7.9 10*3/uL — ABNORMAL HIGH (ref 1.7–7.7)
Neutrophils Relative %: 66 %
Platelets: 162 10*3/uL (ref 150–400)
RBC: 3.38 MIL/uL — ABNORMAL LOW (ref 3.87–5.11)
RDW: 13.4 % (ref 11.5–15.5)
WBC: 12 10*3/uL — ABNORMAL HIGH (ref 4.0–10.5)
nRBC: 0 % (ref 0.0–0.2)

## 2019-09-27 LAB — BASIC METABOLIC PANEL
Anion gap: 13 (ref 5–15)
BUN: 36 mg/dL — ABNORMAL HIGH (ref 6–20)
CO2: 19 mmol/L — ABNORMAL LOW (ref 22–32)
Calcium: 9.6 mg/dL (ref 8.9–10.3)
Chloride: 107 mmol/L (ref 98–111)
Creatinine, Ser: 0.99 mg/dL (ref 0.44–1.00)
GFR calc Af Amer: >60 mL/min (ref >60)
GFR calc non Af Amer: >60 mL/min (ref >60)
Glucose, Bld: 96 mg/dL (ref 70–99)
Potassium: 4.4 mmol/L (ref 3.5–5.1)
Sodium: 139 mmol/L (ref 135–145)

## 2019-09-27 MED ORDER — ALBUTEROL SULFATE HFA 108 (90 BASE) MCG/ACT IN AERS: 2.0000 | INHALATION_SPRAY | RESPIRATORY_TRACT | 8 refills | Status: DC | PRN

## 2019-09-27 MED ORDER — SPIRIVA HANDIHALER 18 MCG IN CAPS: 18.0000 ug | ORAL_CAPSULE | Freq: Every day | RESPIRATORY_TRACT | 2 refills | Status: AC

## 2019-09-27 MED ORDER — AEROCHAMBER MV MISC: 0 refills | Status: AC

## 2019-09-27 MED ORDER — PREDNISONE 50 MG PO TABS: 50.0000 mg | ORAL_TABLET | Freq: Every day | ORAL | 0 refills | Status: DC

## 2019-09-27 NOTE — TOC Transition Note (Signed)
Transition of Care [facility]) - CM/SW Discharge Note   Patient Details  Name: Raven Hansen MRN: [id number] Date of Birth: 1961/04/04  Transition of Care [facility] CM/SW Contact:  Zenon Mayo, RN Phone Number: 09/27/2019, 10:01 AM   Clinical Narrative:    NCM spoke with patient, offered choice for [facility] for COPD disease management, she states she does not have a perference of an agency.  NCM made referral to [facility] with [facility], she states they are able to take referral.  Soc will begin 24 to 48 hrs post dc.    Final next level of care: [place] Barriers to Discharge: No Barriers Identified   Patient Goals and CMS Choice Patient states their goals for this hospitalization and ongoing recovery are:: get better CMS Medicare.gov Compare Post Acute Care list provided to:: Patient Choice offered to / list presented to : Patient  Discharge Placement                       Discharge Plan and Services                DME Arranged: (NA)         HH Arranged: RN [place] Agency: [place] Date Walker: 09/27/19 Time HH Agency Contacted: 1000 Representative spoke with at [place]: [place] Determinants of Health ([place]) Interventions     Readmission Risk Interventions No flowsheet data found.

## 2019-09-27 NOTE — Evaluation (Signed)
Physical Therapy Evaluation & Discharge Patient Details Name: Raven Hansen MRN: [id number] DOB: 06/06/1961 Today's Date: 09/27/2019   History of Present Illness  Pt is a 58 y.o. female admitted 09/25/19 with SOB and wheezing. Worked up for acute hypoxic respiratory failure secondary to COPD and bronchitis. PMH includes lumbar compression fx (s/p vertebroplasty 07/2019), prior ACDF, seizures, cervical radiculopathy.    Clinical Impression  Patient evaluated by Physical Therapy with no further acute PT needs identified. PTA, pt independent and lives alone. Today, pt independent with mobility and ADL tasks. SpO2 91-96% on RA. All education has been completed and the patient has no further questions. Encouraged continued ambulation during hospital admission. PT is signing off. Thank you for this referral.  SATURATION QUALIFICATIONS:  Patient Saturations on Room Air at Rest = 95% Patient Saturations on Room Air while Ambulating = 91%     Follow Up Recommendations No PT follow up    Equipment Recommendations  None recommended by PT    Recommendations for Other Services       Precautions / Restrictions Precautions Precautions: None Restrictions Weight Bearing Restrictions: No      Mobility  Bed Mobility Overal bed mobility: Independent                Transfers Overall transfer level: Independent Equipment used: None                Ambulation/Gait Ambulation/Gait assistance: Independent Gait Distance (Feet): 300 Feet Assistive device: None;IV Pole Gait Pattern/deviations: WFL(Within Functional Limits)   Gait velocity interpretation: >2.62 ft/sec, indicative of community ambulatory General Gait Details: Steady gait independent with and without pushing IV pole. SpO2 91-96% on RA with activity; DOE 2/4, pt conversing and wearing face mask  Stairs            Wheelchair Mobility    Modified Rankin (Stroke Patients Only)       Balance Overall balance  assessment: Needs assistance   Sitting balance-Leahy Scale: Normal       Standing balance-Leahy Scale: Good                               Pertinent Vitals/Pain Pain Assessment: Faces Faces Pain Scale: Hurts a little bit Pain Location: Back Pain Descriptors / Indicators: Discomfort Pain Intervention(s): Monitored during session    Home Living Family/patient expects to be discharged to:: Private residence Living Arrangements: Alone Available Help at Discharge: Friend(s);Available PRN/intermittently Type of Home: Mobile home Home Access: Level entry     Home Layout: One level Home Equipment: Walker - 2 wheels Additional Comments: Does not wear home O2 during day    Prior Function Level of Independence: Independent         Comments: Does not work, on disability. Drives. Enjoys caring for her cats     Hand Dominance        Extremity/Trunk Assessment   Upper Extremity Assessment Upper Extremity Assessment: Generalized weakness    Lower Extremity Assessment Lower Extremity Assessment: Overall WFL for tasks assessed       Communication   Communication: No difficulties  Cognition Arousal/Alertness: Awake/alert Behavior During Therapy: WFL for tasks assessed/performed Overall Cognitive Status: Within Functional Limits for tasks assessed                                        General Comments  Exercises     Assessment/Plan    PT Assessment Patent does not need any further PT services  PT Problem List         PT Treatment Interventions      PT Goals (Current goals can be found in the Care Plan section)  Acute Rehab PT Goals PT Goal Formulation: All assessment and education complete, DC therapy    Frequency     Barriers to discharge        Co-evaluation               AM-PAC PT "6 Clicks" Mobility  Outcome Measure Help needed turning from your back to your side while in a flat bed without using  bedrails?: None Help needed moving from lying on your back to sitting on the side of a flat bed without using bedrails?: None Help needed moving to and from a bed to a chair (including a wheelchair)?: None Help needed standing up from a chair using your arms (e.g., wheelchair or bedside chair)?: None Help needed to walk in hospital room?: None Help needed climbing 3-5 steps with a railing? : None 6 Click Score: 24    End of Session   Activity Tolerance: Patient tolerated treatment well Patient left: in chair;with call bell/phone within reach Nurse Communication: Mobility status      Time: [id number] PT Time Calculation (min) (ACUTE ONLY): 18 min   Charges:   PT Evaluation $PT Eval Moderate Complexity: [place], PT, DPT Acute Rehabilitation Services  Pager [phone] Office [phone]  Derry Lory 09/27/2019, 9:56 AM
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
## 2019-09-28 ENCOUNTER — Other Ambulatory Visit: Payer: Self-pay | Admitting: *Deleted

## 2019-09-28 NOTE — Patient Outreach (Signed)
[facility]) Care Management  09/28/2019  Ambermarie Seery [birth year] [id number]    Transition of care (d/c 09/27/2019)  RN attempted outreach call however unsuccessful. RN able to leave a HIPAA approved voice message requesting a call back.  Plan: Will rescheduled another outreach call for ongoing [facility] services next week.  Raina Mina, RN Care Management Coordinator [place] Office [phone]

## 2019-09-30 LAB — CULTURE, BLOOD (ROUTINE X 2)
Culture: NO GROWTH
Culture: NO GROWTH
Special Requests: ADEQUATE

## 2019-10-03 ENCOUNTER — Ambulatory Visit: Payer: Medicare Other | Admitting: Orthopaedic Surgery

## 2019-10-04 ENCOUNTER — Other Ambulatory Visit: Payer: Self-pay | Admitting: *Deleted

## 2019-10-04 NOTE — Patient Outreach (Signed)
[facility]) Care Management  10/04/2019  Meha Goon October 18, 1961 [id number]    Transition of care 09/25/2019-09/27/2019  Outreach #2  RN attempted outreach however remains unsuccessful. RN able to leave a HIPAA approved voice message requesting a call back. Will further address ongoing needs of care at that time.   Plan: Will rescheduled an additional outreach call accordingly over the next week.  Raina Mina, RN Care Management Coordinator [place] Office [phone]

## 2019-10-09 ENCOUNTER — Ambulatory Visit: Payer: Medicare Other | Admitting: *Deleted

## 2019-10-10 ENCOUNTER — Encounter: Payer: Medicare Other | Admitting: Neurology

## 2019-10-10 ENCOUNTER — Other Ambulatory Visit: Payer: Medicare Other

## 2019-10-11 ENCOUNTER — Other Ambulatory Visit: Payer: Self-pay | Admitting: *Deleted

## 2019-10-11 NOTE — Patient Outreach (Signed)
[facility]) Care Management  10/11/2019  Deboah Jilek 1960-12-27 [id number]    Telephone Assessment-Outreach #3   RN attempted outreach call however unsuccessful. RN able to leave a HIPAA approved voice message requesting a call back.  Plan: Will schedule pt out to January for another outreach call.   Raina Mina, RN Care Management Coordinator [place] Office [phone]

## 2019-10-16 ENCOUNTER — Encounter: Payer: Self-pay | Admitting: Orthopaedic Surgery

## 2019-10-16 ENCOUNTER — Ambulatory Visit (INDEPENDENT_AMBULATORY_CARE_PROVIDER_SITE_OTHER): Payer: Medicare Other | Admitting: Orthopaedic Surgery

## 2019-10-16 ENCOUNTER — Other Ambulatory Visit: Payer: Self-pay

## 2019-10-16 VITALS — Ht 64.0 in | Wt 150.0 lb

## 2019-10-16 DIAGNOSIS — M25511 Pain in right shoulder: Secondary | ICD-10-CM

## 2019-10-16 DIAGNOSIS — G8929 Other chronic pain: Secondary | ICD-10-CM | POA: Diagnosis not present

## 2019-10-16 DIAGNOSIS — M25512 Pain in left shoulder: Secondary | ICD-10-CM

## 2019-10-16 DIAGNOSIS — M170 Bilateral primary osteoarthritis of knee: Secondary | ICD-10-CM | POA: Diagnosis not present

## 2019-10-16 MED ORDER — METHYLPREDNISOLONE ACETATE 40 MG/ML IJ SUSP
80.0000 mg | INTRAMUSCULAR | Status: AC | PRN
  Administered 2019-10-16: 80 mg via INTRA_ARTICULAR

## 2019-10-16 MED ORDER — BUPIVACAINE HCL 0.5 % IJ SOLN
2.0000 mL | INTRAMUSCULAR | Status: AC | PRN
  Administered 2019-10-16: 2 mL via INTRA_ARTICULAR

## 2019-10-16 MED ORDER — LIDOCAINE HCL 2 % IJ SOLN
2.0000 mL | INTRAMUSCULAR | Status: AC | PRN
  Administered 2019-10-16: 2 mL

## 2019-10-16 MED ORDER — METHYLPREDNISOLONE ACETATE 40 MG/ML IJ SUSP
80.0000 mg | INTRAMUSCULAR | Status: AC | PRN
  Administered 2019-10-16: 15:00:00 80 mg via INTRA_ARTICULAR

## 2019-10-16 MED ORDER — LIDOCAINE HCL 1 % IJ SOLN
2.0000 mL | INTRAMUSCULAR | Status: AC | PRN
  Administered 2019-10-16: 2 mL

## 2019-10-16 NOTE — Progress Notes (Signed)
Office Visit Note   Patient: Raven Hansen           Date of Birth: 10-01-1961           MRN: [id number] Visit Date: 10/16/2019              Requested by: [name], MD [id number] [place],  [place] [address] PCP: [name], MD   Assessment & Plan: Visit Diagnoses:  1. Bilateral primary osteoarthritis of knee   2. Chronic pain of both shoulders     Plan: Ms. Pitcairn has history of bilateral knee osteoarthritis and at intervals has had cortisone injection.  We have also a discussion regarding other treatment options should she prefer to have the cortisone.  The more symptomatic left knee was injected today.  She also has a history of impingement syndrome bilateral shoulders previously treated in [place] before she moved to this area I will inject the subacromial space and see her back as needed.  Had films in 2019 demonstrating a very small inferior humeral head spur  Follow-Up Instructions: Return if symptoms worsen or fail to improve.   Orders:  Orders Placed This Encounter  Procedures  . Large Joint Inj: L knee  . Large Joint Inj: R subacromial bursa   No orders of the defined types were placed in this encounter.     Procedures: Large Joint Inj: L knee on 10/16/2019 2:45 PM Indications: pain and diagnostic evaluation Details: 25 G 1.5 in needle, anteromedial approach  Arthrogram: No  Medications: 2 mL lidocaine 1 %; 2 mL bupivacaine 0.5 %; 80 mg methylPREDNISolone acetate 40 MG/ML Procedure, treatment alternatives, risks and benefits explained, specific risks discussed. Consent was given by the patient. Patient was prepped and draped in the usual sterile fashion.   Large Joint Inj: R subacromial bursa on 10/16/2019 2:46 PM Indications: pain and diagnostic evaluation Details: 25 G 1.5 in needle, anterolateral approach  Arthrogram: No  Medications: 2 mL lidocaine 2 %; 2 mL bupivacaine 0.5 %; 80 mg methylPREDNISolone acetate 40  MG/ML Consent was given by the patient. Immediately prior to procedure a time out was called to verify the correct patient, procedure, equipment, support staff and site/side marked as required. Patient was prepped and draped in the usual sterile fashion.       Clinical Data: No additional findings.   Subjective: Chief Complaint  Patient presents with  . Right Shoulder - Pain  . Left Knee - Pain  Patient presents today for a three month follow up on her right shoulder and left knee. She was here three months ago and received a cortisone injection in both areas. She is wanting to get another set of injections today.   HPI  Review of Systems   Objective: Vital Signs: Ht 5\' 4"  (1.626 m)   Wt 150 lb (68 kg)   BMI 25.75 kg/m   Physical Exam  Ortho Exam awake alert and oriented x3.  Comfortable sitting.  Left knee with well-healed prior surgical scars.  No effusion.  Predominately medial joint pain.  Full extension flexion over 105 degrees without instability.  Some patellar crepitation but no pain with patella compression.  Right shoulder with positive impingement.  Slight decreased external rotation compared to the left side but not functional loss.  Able to place her arm over her head.  Good grip and release.  Biceps intact.  No pain with range of motion of cervical spine  Specialty Comments:  No specialty  comments available.  Imaging: No results found.   PMFS History: Patient Active Problem List   Diagnosis Date Noted  . COPD with acute exacerbation ([place]) 09/25/2019  . AKI (acute kidney injury) ([place]) 09/25/2019  . Chronic back pain 09/25/2019  . Acute on chronic kidney failure ([place]) 09/25/2019  . Acute respiratory failure with hypoxemia ([place]) 09/25/2019  . Numbness 08/30/2019  . Confusion 08/30/2019  . Chondrocalcinosis due to dicalcium phosphate crystals, of the knee, right 04/12/2019  . Chronic pain of both shoulders 01/30/2019  . Bilateral primary osteoarthritis  of knee 01/08/2019  . Exposure to communicable disease 05/09/2018  . Flying phobia 05/09/2018  . H/O bee sting allergy 05/09/2018  . Mixed hyperlipidemia 05/09/2018  . Allergic contact dermatitis 04/14/2018  . Preop general physical exam 12/14/2017  . Mass of right hand 07/14/2017  . Cat scratch of forearm, right, initial encounter 06/30/2017  . Family history of breast cancer 06/22/2017  . History of ovarian cancer 06/22/2017  . Bipolar affective disorder in remission ([place]) 04/13/2017  . Allergic dermatitis due to poison ivy 03/10/2017  . Myofascial pain 03/10/2017  . Acute bronchitis due to Mycoplasma pneumoniae 02/17/2017  . Anemia 02/17/2017  . Cervical radiculopathy 02/17/2017  . Chronic pain syndrome 02/17/2017  . Complete tear of anterior cruciate ligament of knee 02/17/2017  . Dental infection 02/17/2017  . Epidermal cyst 02/17/2017  . Failed back surgical syndrome 02/17/2017  . Hypoglycemia 02/17/2017  . Lesion of nipple 02/17/2017  . DJD (degenerative joint disease) of knee 02/17/2017  . Petechiae 02/17/2017  . Rotator cuff impingement syndrome of right shoulder 02/17/2017  . Seizures ([place]) 02/17/2017  . Anxiety and depression 01/19/2017  . Cervical disc disease 01/19/2017  . Chronic bronchitis ([place]) 01/19/2017  . Contusion of right great toe without damage to nail 01/19/2017  . Migraine aura, persistent, intractable 01/19/2017  . Viral upper respiratory tract infection 01/19/2017  . Chronic diarrhea 01/14/2017  . Bipolar 2 disorder, major depressive episode ([place]) 08/26/2016  . Tobacco use disorder, severe, dependence 08/26/2016  . Benzodiazepine dependence ([place]) 08/25/2016  . Pain in right knee 07/02/2016  . Dry eye syndrome 06/17/2016  . Episode of recurrent major depressive disorder ([place]) 06/17/2016  . Essential hypertension 06/17/2016  . GERD without esophagitis 06/17/2016  . Hepatitis C 06/17/2016  . Herniated cervical disc 06/17/2016  . Herpes simplex  vulvovaginitis 06/17/2016  . Hypothyroidism (acquired) 06/17/2016  . Insomnia 06/17/2016  . Malaise and fatigue 06/17/2016  . Polycystic kidney disease 06/17/2016  . Primary osteoarthritis involving multiple joints 06/17/2016  . Allergic rhinitis 08/07/2014  . Anxiety state 08/07/2014  . Cough 08/07/2014  . Other amnesia 08/07/2014  . Other fatigue 08/07/2014  . Severe episode of recurrent major depressive disorder, with psychotic features ([place]) 08/07/2014  . Tobacco use 08/07/2014   Past Medical History:  Diagnosis Date  . Acid reflux   . Cancer ([place])    ovarian cancer  . Cataract   . Chronic kidney disease    Stage 3  . Colitis   . Compound fracture    L1  . COPD (chronic obstructive pulmonary disease) ([place])   . Hepatitis C   . Hypertension   . Neck fracture ([place])   . Neuromuscular disorder (H[facility])    neuropathy  . Numbness and tingling   . Thyroid disease   . Tremor     Family History  Problem Relation Age of Onset  . Other Mother        unsure of history  .  Diabetes Father   . Hypertension Father   . Colon cancer Father   . Hypertension Sister   . Hypertension Brother     Past Surgical History:  Procedure Laterality Date  . CHOLECYSTECTOMY    . FOOT SURGERY    . IR VERTEBROPLASTY LUMBAR BX INC UNI/BIL INC/INJECT/IMAGING  08/21/2019  . KNEE ARTHROSCOPY    . NECK SURGERY    . SHOULDER ARTHROSCOPY    . VAGINAL HYSTERECTOMY     Social History   Occupational History  . Occupation: Disabled  Tobacco Use  . Smoking status: Current Every Day Smoker    Packs/day: 0.25    Years: 37.00    Pack years: 9.25    Types: Cigarettes  . Smokeless tobacco: Never Used  Substance and Sexual Activity  . Alcohol use: Yes    Comment: RARELY  . Drug use: Never  . Sexual activity: Not on file

## 2019-10-29 ENCOUNTER — Inpatient Hospital Stay: Admission: RE | Admit: 2019-10-29 | Payer: Medicare Other | Source: Ambulatory Visit

## 2019-11-06 ENCOUNTER — Encounter: Payer: Self-pay | Admitting: Orthopaedic Surgery

## 2019-11-06 ENCOUNTER — Other Ambulatory Visit: Payer: Self-pay

## 2019-11-06 ENCOUNTER — Ambulatory Visit: Payer: Self-pay

## 2019-11-06 ENCOUNTER — Ambulatory Visit (INDEPENDENT_AMBULATORY_CARE_PROVIDER_SITE_OTHER): Payer: Medicare Other | Admitting: Orthopaedic Surgery

## 2019-11-06 VITALS — Ht 64.0 in | Wt 156.0 lb

## 2019-11-06 DIAGNOSIS — M11261 Other chondrocalcinosis, right knee: Secondary | ICD-10-CM | POA: Insufficient documentation

## 2019-11-06 DIAGNOSIS — M7541 Impingement syndrome of right shoulder: Secondary | ICD-10-CM

## 2019-11-06 DIAGNOSIS — G8929 Other chronic pain: Secondary | ICD-10-CM

## 2019-11-06 DIAGNOSIS — M25512 Pain in left shoulder: Secondary | ICD-10-CM

## 2019-11-06 DIAGNOSIS — M5020 Other cervical disc displacement, unspecified cervical region: Secondary | ICD-10-CM

## 2019-11-06 DIAGNOSIS — M11262 Other chondrocalcinosis, left knee: Secondary | ICD-10-CM

## 2019-11-06 MED ORDER — METHYLPREDNISOLONE ACETATE 40 MG/ML IJ SUSP
80.0000 mg | INTRAMUSCULAR | Status: AC | PRN
  Administered 2019-11-06: 80 mg via INTRA_ARTICULAR

## 2019-11-06 MED ORDER — BUPIVACAINE HCL 0.25 % IJ SOLN
2.0000 mL | INTRAMUSCULAR | Status: AC | PRN
  Administered 2019-11-06: 2 mL via INTRA_ARTICULAR

## 2019-11-06 MED ORDER — LIDOCAINE HCL 1 % IJ SOLN
2.0000 mL | INTRAMUSCULAR | Status: AC | PRN
  Administered 2019-11-06: 2 mL

## 2019-11-06 NOTE — Progress Notes (Signed)
Office Visit Note   Patient: Raven Hansen           Date of Birth: 31-Aug-1961           MRN: [id number] Visit Date: 11/06/2019              Requested by: [name], MD [id number] [place],  [place] [address] PCP: [name], MD   Assessment & Plan: Visit Diagnoses:  1. Chronic left shoulder pain   2. Impingement syndrome of right shoulder   3. Herniated cervical disc   4. Pseudogout of left knee   5. Pseudogout of right knee     Plan:  #1: Corticosteroid injection to the right knee #2: MRI scan  right shoulder to rule out rotator cuff pathology  Follow-Up Instructions: Return in about 3 months (around 02/04/2020).  If she desires repeat injections.  Face-to-face time spent with patient was greater than 30 minutes.  Greater than 50% of the time was spent in counseling and coordination of care.  Orders:  Orders Placed This Encounter  Procedures  . XR Shoulder Left   No orders of the defined types were placed in this encounter.     Procedures: Large Joint Inj: R knee on 11/06/2019 5:28 PM Indications: pain and diagnostic evaluation Details: 25 G 1.5 in needle, anteromedial approach  Arthrogram: No  Medications: 2 mL lidocaine 1 %; 80 mg methylPREDNISolone acetate 40 MG/ML; 2 mL bupivacaine 0.25 % Outcome: tolerated well, no immediate complications Procedure, treatment alternatives, risks and benefits explained, specific risks discussed. Consent was given by the patient. Immediately prior to procedure a time out was called to verify the correct patient, procedure, equipment, support staff and site/side marked as required. Patient was prepped and draped in the usual sterile fashion.       Clinical Data: No additional findings.   Subjective: Chief Complaint  Patient presents with  . Left Knee - Pain  . Right Shoulder - Pain  Patient presents today for pain in her left knee and right shoulder. She was last evaluated on  10/16/2019 and received a left knee and right shoulder cortisone injection. She said that she passed out and fell two days before Christmas. She is wanting to get repeat injections today.   HPI  Review of Systems  Constitutional: Negative for fatigue.  HENT: Negative for ear pain.   Eyes: Negative for pain.  Respiratory: Negative for shortness of breath.   Cardiovascular: Negative for leg swelling.  Gastrointestinal: Positive for diarrhea. Negative for constipation.  Endocrine: Positive for cold intolerance. Negative for heat intolerance.  Genitourinary: Positive for difficulty urinating.  Musculoskeletal: Negative for joint swelling.  Skin: Negative for rash.  Allergic/Immunologic: Negative for food allergies.  Neurological: Positive for weakness.  Hematological: Does not bruise/bleed easily.  Psychiatric/Behavioral: Negative for sleep disturbance.     Objective: Vital Signs: Ht 5\' 4"  (1.626 m)   Wt 156 lb (70.8 kg)   BMI 26.78 kg/m   Physical Exam Constitutional:      Appearance: She is well-developed.  Eyes:     Pupils: Pupils are equal, round, and reactive to light.  Pulmonary:     Effort: Pulmonary effort is normal.  Skin:    General: Skin is warm and dry.  Neurological:     Mental Status: She is alert and oriented to person, place, and time.  Psychiatric:        Behavior: Behavior normal.     Ortho Exam  Exam today reveals a minimal effusion.  She has range of motion lacking a few degrees of full extension flexes to about 100 degrees.  Ligamentously stable.  Some crepitance with range of motion.  Tenderness along the medial joint line more than lateral.  Shoulders revealed on the left abduction to about 110 degrees.  Forward flexion to 140 degrees.  External rotation around 70degrees.  Internal rotation to about 45 degrees.  Specialty Comments:  No specialty comments available.  Imaging: XR Shoulder Left  Result Date: 11/06/2019 Three-view x-ray of the  right shoulder revealed a type I acromium.  Some degenerative changes of the [facility] joint.  Glenoid humeral joint is intact with adequate spacing.    PMFS History: Current Outpatient Medications  Medication Sig Dispense Refill  . albuterol (VENTOLIN HFA) 108 (90 Base) MCG/ACT inhaler Inhale 2 puffs into the lungs every 4 (four) hours as needed for wheezing. 18 g 8  . atenolol (TENORMIN) 50 MG tablet Take 50 mg by mouth daily.  0  . carisoprodol (SOMA) 350 MG tablet Take 350 mg by mouth 3 (three) times daily as needed.    . cholestyramine (QUESTRAN) 4 g packet Take 4 g by mouth 4 (four) times daily.     Marland Kitchen escitalopram (LEXAPRO) 20 MG tablet Take 20 mg by mouth daily.    . fluticasone (FLONASE) 50 MCG/ACT nasal spray Place 1 spray into both nostrils 2 (two) times daily.   2  . HYDROcodone-acetaminophen (NORCO) 10-325 MG tablet Take 1 tablet by mouth every 6 (six) hours as needed for pain.    . hydrOXYzine (ATARAX/VISTARIL) 25 MG tablet Take 25 mg by mouth 3 (three) times daily.    Marland Kitchen levothyroxine (SYNTHROID) 137 MCG tablet Take 125 mcg by mouth daily before breakfast.     . lisinopril (ZESTRIL) 20 MG tablet Take 20 mg by mouth daily.    . methocarbamol (ROBAXIN) 500 MG tablet Take 1 tablet (500 mg total) by mouth 4 (four) times daily as needed for muscle spasms. 120 tablet 3  . predniSONE (DELTASONE) 50 MG tablet Take 1 tablet (50 mg total) by mouth daily with breakfast. 5 tablet 0  . promethazine-dextromethorphan (PROMETHAZINE-DM) 6.25-15 MG/5ML syrup Take 2.5-5 mLs by mouth 3 (three) times daily as needed.    Marland Kitchen Spacer/Aero-Holding Chambers (AEROCHAMBER MV) inhaler Use as instructed 1 each 0  . SUMAtriptan (IMITREX) 100 MG tablet Take 100 mg by mouth every 2 (two) hours as needed for migraine.     . tiotropium (SPIRIVA HANDIHALER) 18 MCG inhalation capsule Place 1 capsule (18 mcg total) into inhaler and inhale daily. 30 capsule 2  . valACYclovir (VALTREX) 1000 MG tablet Take 1,000 mg by mouth as  needed (for outbreak).      No current facility-administered medications for this visit.    Patient Active Problem List   Diagnosis Date Noted  . Pseudogout of left knee 11/06/2019  . Pseudogout of right knee 11/06/2019  . COPD with acute exacerbation ([place]) 09/25/2019  . AKI (acute kidney injury) ([place]) 09/25/2019  . Chronic back pain 09/25/2019  . Acute on chronic kidney failure ([place]) 09/25/2019  . Acute respiratory failure with hypoxemia ([place]) 09/25/2019  . Numbness 08/30/2019  . Confusion 08/30/2019  . Chondrocalcinosis due to dicalcium phosphate crystals, of the knee, right 04/12/2019  . Chronic pain of both shoulders 01/30/2019  . Bilateral primary osteoarthritis of knee 01/08/2019  . Exposure to communicable disease 05/09/2018  . Flying phobia 05/09/2018  . H/O bee sting allergy 05/09/2018  .  Mixed hyperlipidemia 05/09/2018  . Allergic contact dermatitis 04/14/2018  . Preop general physical exam 12/14/2017  . Mass of right hand 07/14/2017  . Cat scratch of forearm, right, initial encounter 06/30/2017  . Family history of breast cancer 06/22/2017  . History of ovarian cancer 06/22/2017  . Bipolar affective disorder in remission ([place]) 04/13/2017  . Allergic dermatitis due to poison ivy 03/10/2017  . Myofascial pain 03/10/2017  . Acute bronchitis due to Mycoplasma pneumoniae 02/17/2017  . Anemia 02/17/2017  . Cervical radiculopathy 02/17/2017  . Chronic pain syndrome 02/17/2017  . Complete tear of anterior cruciate ligament of knee 02/17/2017  . Dental infection 02/17/2017  . Epidermal cyst 02/17/2017  . Failed back surgical syndrome 02/17/2017  . Hypoglycemia 02/17/2017  . Lesion of nipple 02/17/2017  . DJD (degenerative joint disease) of knee 02/17/2017  . Petechiae 02/17/2017  . Impingement syndrome of right shoulder 02/17/2017  . Seizures ([place]) 02/17/2017  . Anxiety and depression 01/19/2017  . Cervical disc disease 01/19/2017  . Chronic bronchitis ([place])  01/19/2017  . Contusion of right great toe without damage to nail 01/19/2017  . Migraine aura, persistent, intractable 01/19/2017  . Viral upper respiratory tract infection 01/19/2017  . Chronic diarrhea 01/14/2017  . Bipolar 2 disorder, major depressive episode ([place]) 08/26/2016  . Tobacco use disorder, severe, dependence 08/26/2016  . Benzodiazepine dependence ([place]) 08/25/2016  . Pain in right knee 07/02/2016  . Dry eye syndrome 06/17/2016  . Episode of recurrent major depressive disorder ([place]) 06/17/2016  . Essential hypertension 06/17/2016  . GERD without esophagitis 06/17/2016  . Hepatitis C 06/17/2016  . Herniated cervical disc 06/17/2016  . Herpes simplex vulvovaginitis 06/17/2016  . Hypothyroidism (acquired) 06/17/2016  . Insomnia 06/17/2016  . Malaise and fatigue 06/17/2016  . Polycystic kidney disease 06/17/2016  . Primary osteoarthritis involving multiple joints 06/17/2016  . Allergic rhinitis 08/07/2014  . Anxiety state 08/07/2014  . Cough 08/07/2014  . Other amnesia 08/07/2014  . Other fatigue 08/07/2014  . Severe episode of recurrent major depressive disorder, with psychotic features ([place]) 08/07/2014  . Tobacco use 08/07/2014   Past Medical History:  Diagnosis Date  . Acid reflux   . Cancer ([place])    ovarian cancer  . Cataract   . Chronic kidney disease    Stage 3  . Colitis   . Compound fracture    L1  . COPD (chronic obstructive pulmonary disease) ([place])   . Hepatitis C   . Hypertension   . Neck fracture ([place])   . Neuromuscular disorder (H[facility])    neuropathy  . Numbness and tingling   . Thyroid disease   . Tremor     Family History  Problem Relation Age of Onset  . Other Mother        unsure of history  . Diabetes Father   . Hypertension Father   . Colon cancer Father   . Hypertension Sister   . Hypertension Brother     Past Surgical History:  Procedure Laterality Date  . CHOLECYSTECTOMY    . FOOT SURGERY    . IR VERTEBROPLASTY LUMBAR BX INC  UNI/BIL INC/INJECT/IMAGING  08/21/2019  . KNEE ARTHROSCOPY    . NECK SURGERY    . SHOULDER ARTHROSCOPY    . VAGINAL HYSTERECTOMY     Social History   Occupational History  . Occupation: Disabled  Tobacco Use  . Smoking status: Current Every Day Smoker    Packs/day: 0.25    Years: 37.00    Pack years: 9.25  Types: Cigarettes  . Smokeless tobacco: Never Used  Substance and Sexual Activity  . Alcohol use: Yes    Comment: RARELY  . Drug use: Never  . Sexual activity: Not on file

## 2019-11-07 ENCOUNTER — Telehealth: Payer: Self-pay | Admitting: Specialist

## 2019-11-07 NOTE — Telephone Encounter (Signed)
Patient called.   She has an appointment on Monday and wants to make sure her X-rays are sent over in advance. Her X-rays were done at [facility] by Mauricio Po.   Their number is: [phone]  Patient call back: [phone]

## 2019-11-07 NOTE — Telephone Encounter (Signed)
I called and spoke with patient, I advised that she will need to sign a release of info for [place] to get the xrays---she states that she has done this.  And wants me to call them, I advised that if I called them and they mailed them out tomorrow that we would not get them in time for her appt Monday. I advised that the best option would be to go to their off ice on Monday (as they only work in that office on Monday's) and get a copy to bring to her appt. She asked if we could get the report, I advised that he doesn't like to just look at the report that he wants to look at the images himself, she states he will need to do his own xryas then at her appt, and I advised that it was an option too.  She will see what she can do.

## 2019-11-08 ENCOUNTER — Encounter: Payer: Self-pay | Admitting: *Deleted

## 2019-11-08 ENCOUNTER — Other Ambulatory Visit: Payer: Self-pay | Admitting: *Deleted

## 2019-11-08 NOTE — Patient Outreach (Addendum)
[facility]) Care Management  11/08/2019  Raven Hansen 16-Feb-1961 [id number]    Telephone Assessment-Unsuccessful-case closure  RN attempted to contact pt today however unsuccessful. RN able to leave a HIPAA approved voice message requesting a call back.  Plan: No response from pt after several outreach calls and letter sent. Will close case at this time.  Raina Mina, RN Care Management Coordinator [place] Office [phone]

## 2019-11-12 ENCOUNTER — Ambulatory Visit (INDEPENDENT_AMBULATORY_CARE_PROVIDER_SITE_OTHER): Payer: Medicare Other | Admitting: Specialist

## 2019-11-12 ENCOUNTER — Ambulatory Visit: Payer: Self-pay

## 2019-11-12 ENCOUNTER — Other Ambulatory Visit: Payer: Self-pay

## 2019-11-12 ENCOUNTER — Encounter: Payer: Self-pay | Admitting: Specialist

## 2019-11-12 VITALS — BP 120/79 | HR 81 | Ht 64.0 in | Wt 157.0 lb

## 2019-11-12 DIAGNOSIS — M960 Pseudarthrosis after fusion or arthrodesis: Secondary | ICD-10-CM | POA: Diagnosis not present

## 2019-11-12 DIAGNOSIS — M542 Cervicalgia: Secondary | ICD-10-CM

## 2019-11-12 DIAGNOSIS — M546 Pain in thoracic spine: Secondary | ICD-10-CM

## 2019-11-12 DIAGNOSIS — M545 Low back pain, unspecified: Secondary | ICD-10-CM

## 2019-11-12 DIAGNOSIS — G8929 Other chronic pain: Secondary | ICD-10-CM

## 2019-11-12 MED ORDER — OXYCODONE HCL 5 MG PO CAPS: 5.0000 mg | ORAL_CAPSULE | ORAL | 0 refills | Status: AC | PRN

## 2019-11-12 NOTE — Progress Notes (Signed)
Office Visit Note   Patient: Raven Hansen           Date of Birth: [birth year]           MRN: [id number] Visit Date: 11/12/2019              Requested by: [name], MD [place],  [place] [address] PCP: [name], MD   Assessment & Plan: Visit Diagnoses:  1. Chronic low back pain, unspecified back pain laterality, unspecified whether sciatica present     Plan: Avoid overhead lifting and overhead use of the arms. Do not lift greater than 5 lbs. Adjust head rest in vehicle to prevent hyperextension if rear ended. Take extra precautions to avoid falling, including use of a cane if you feel weak. Fall Prevention and Home Safety Falls cause injuries and can affect all age groups. It is possible to use preventive measures to significantly decrease the likelihood of falls. There are many simple measures which can make your home safer and prevent falls. OUTDOORS  Repair cracks and edges of walkways and driveways.  Remove high doorway thresholds.  Trim shrubbery on the main path into your home.  Have good outside lighting.  Clear walkways of tools, rocks, debris, and clutter.  Check that handrails are not broken and are securely fastened. Both sides of steps should have handrails.  Have leaves, snow, and ice cleared regularly.  Use sand or salt on walkways during winter months.  In the garage, clean up grease or oil spills. BATHROOM  Install night lights.  Install grab bars by the toilet and in the tub and shower.  Use non-skid mats or decals in the tub or shower.  Place a plastic non-slip stool in the shower to sit on, if needed.  Keep floors dry and clean up all water on the floor immediately.  Remove soap buildup in the tub or shower on a regular basis.  Secure bath mats with non-slip, double-sided rug tape.  Remove throw rugs and tripping hazards from the floors. BEDROOMS  Install night lights.  Make sure a bedside light is  easy to reach.  Do not use oversized bedding.  Keep a telephone by your bedside.  Have a firm chair with side arms to use for getting dressed.  Remove throw rugs and tripping hazards from the floor. KITCHEN  Keep handles on pots and pans turned toward the center of the stove. Use back burners when possible.  Clean up spills quickly and allow time for drying.  Avoid walking on wet floors.  Avoid hot utensils and knives.  Position shelves so they are not too high or low.  Place commonly used objects within easy reach.  If necessary, use a sturdy step stool with a grab bar when reaching.  Keep electrical cables out of the way.  Do not use floor polish or wax that makes floors slippery. If you must use wax, use non-skid floor wax.  Remove throw rugs and tripping hazards from the floor. STAIRWAYS  Never leave objects on stairs.  Place handrails on both sides of stairways and use them. Fix any loose handrails. Make sure handrails on both sides of the stairways are as long as the stairs.  Check carpeting to make sure it is firmly attached along stairs. Make repairs to worn or loose carpet promptly.  Avoid placing throw rugs at the top or bottom of stairways, or properly secure the rug with carpet tape to prevent slippage. Get  rid of throw rugs, if possible.  Have an electrician put in a light switch at the top and bottom of the stairs. OTHER FALL PREVENTION TIPS  Wear low-heel or rubber-soled shoes that are supportive and fit well. Wear closed toe shoes.  When using a stepladder, make sure it is fully opened and both spreaders are firmly locked. Do not climb a closed stepladder.  Add color or contrast paint or tape to grab bars and handrails in your home. Place contrasting color strips on first and last steps.  Learn and use mobility aids as needed. Install an electrical emergency response system.  Turn on lights to avoid dark areas. Replace light bulbs that burn out  immediately. Get light switches that glow.  Arrange furniture to create clear pathways. Keep furniture in the same place.  Firmly attach carpet with non-skid or double-sided tape.  Eliminate uneven floor surfaces.  Select a carpet pattern that does not visually hide the edge of steps.  Be aware of all pets. OTHER HOME SAFETY TIPS  Set the water temperature for 120 F (48.8 C).  Keep emergency numbers on or near the telephone.  Keep smoke detectors on every level of the home and near sleeping areas. Document Released: 10/01/2002 Document Revised: 04/11/2012 Document Reviewed: 12/31/2011 [facility] Patient Information 2014 [place]. CT of the cervical spine to assess the area of previous fusion C6-7 for healing or non union.   Follow-Up Instructions: No follow-ups on file.   Orders:  Orders Placed This Encounter  Procedures  . XR Lumbar Spine 2-3 Views   No orders of the defined types were placed in this encounter.     Procedures: No procedures performed   Clinical Data: No additional findings.   Subjective: Chief Complaint  Patient presents with  . Lower Back - Follow-up    59 year old female with history of L1 compression fracture treated with kyphoplasty in October 2020. She was doing well until she took some falls. She reports that she is out of pain medications and she is out of it. She has had some injections of her back in [place] injections, ESIs in the lumbar spine. She has had MRI of the neck and had surgery in 2010. She reports having fallen and hitting her head and she frll with the tree beside her. She is concerned about her spine and neck. She has had a previous cervical fracture and reports having snapped her neck lifting 5 gallon buckets out of a rain barrel and she had severe pain the next day had ACDF C5-7. Reports that her falls were due to change in her blood pressure medications. She reports that she has been hurting in the area between  her shoulder blades and also in the lumbar area tranversely. She has colitis but no  Changes in her bowel or bladder.    Review of Systems  Constitutional: Negative.   HENT: Negative.   Eyes: Negative.   Respiratory: Positive for cough, shortness of breath and wheezing. Negative for apnea, chest tightness and stridor.   Cardiovascular: Positive for chest pain. Negative for palpitations and leg swelling.  Gastrointestinal: Negative.   Endocrine: Negative.  Negative for cold intolerance, heat intolerance, polydipsia, polyphagia and polyuria.  Genitourinary: Negative.  Negative for difficulty urinating, dyspareunia, dysuria, enuresis, flank pain, frequency and genital sores.  Musculoskeletal: Positive for back pain, neck pain and neck stiffness. Negative for arthralgias, gait problem, joint swelling and myalgias.  Skin: Negative.   Allergic/Immunologic: Positive for environmental allergies.  Neurological: Positive for dizziness. Negative for tremors, seizures, syncope, facial asymmetry, speech difficulty, weakness, light-headedness, numbness and headaches.  Hematological: Negative.   Psychiatric/Behavioral: Negative for agitation, behavioral problems, confusion, decreased concentration, dysphoric mood, hallucinations and self-injury. The patient is not nervous/anxious and is not hyperactive.      Objective: Vital Signs: BP 120/79 (BP Location: Left Arm, Patient Position: Sitting)   Pulse 81   Ht 5\' 4"  (1.626 m)   Wt 157 lb (71.2 kg)   BMI 26.95 kg/m   Physical Exam  Ortho Exam  Specialty Comments:  No specialty comments available.  Imaging: No results found.   PMFS History: Patient Active Problem List   Diagnosis Date Noted  . Pseudogout of left knee 11/06/2019  . Pseudogout of right knee 11/06/2019  . COPD with acute exacerbation ([place]) 09/25/2019  . AKI (acute kidney injury) ([place]) 09/25/2019  . Chronic back pain 09/25/2019  . Acute on chronic kidney failure ([place])  09/25/2019  . Acute respiratory failure with hypoxemia ([place]) 09/25/2019  . Numbness 08/30/2019  . Confusion 08/30/2019  . Chondrocalcinosis due to dicalcium phosphate crystals, of the knee, right 04/12/2019  . Chronic pain of both shoulders 01/30/2019  . Bilateral primary osteoarthritis of knee 01/08/2019  . Exposure to communicable disease 05/09/2018  . Flying phobia 05/09/2018  . H/O bee sting allergy 05/09/2018  . Mixed hyperlipidemia 05/09/2018  . Allergic contact dermatitis 04/14/2018  . Preop general physical exam 12/14/2017  . Mass of right hand 07/14/2017  . Cat scratch of forearm, right, initial encounter 06/30/2017  . Family history of breast cancer 06/22/2017  . History of ovarian cancer 06/22/2017  . Bipolar affective disorder in remission ([place]) 04/13/2017  . Allergic dermatitis due to poison ivy 03/10/2017  . Myofascial pain 03/10/2017  . Acute bronchitis due to Mycoplasma pneumoniae 02/17/2017  . Anemia 02/17/2017  . Cervical radiculopathy 02/17/2017  . Chronic pain syndrome 02/17/2017  . Complete tear of anterior cruciate ligament of knee 02/17/2017  . Dental infection 02/17/2017  . Epidermal cyst 02/17/2017  . Failed back surgical syndrome 02/17/2017  . Hypoglycemia 02/17/2017  . Lesion of nipple 02/17/2017  . DJD (degenerative joint disease) of knee 02/17/2017  . Petechiae 02/17/2017  . Impingement syndrome of right shoulder 02/17/2017  . Seizures ([place]) 02/17/2017  . Anxiety and depression 01/19/2017  . Cervical disc disease 01/19/2017  . Chronic bronchitis ([place]) 01/19/2017  . Contusion of right great toe without damage to nail 01/19/2017  . Migraine aura, persistent, intractable 01/19/2017  . Viral upper respiratory tract infection 01/19/2017  . Chronic diarrhea 01/14/2017  . Bipolar 2 disorder, major depressive episode ([place]) 08/26/2016  . Tobacco use disorder, severe, dependence 08/26/2016  . Benzodiazepine dependence ([place]) 08/25/2016  . Pain in right  knee 07/02/2016  . Dry eye syndrome 06/17/2016  . Episode of recurrent major depressive disorder ([place]) 06/17/2016  . Essential hypertension 06/17/2016  . GERD without esophagitis 06/17/2016  . Hepatitis C 06/17/2016  . Herniated cervical disc 06/17/2016  . Herpes simplex vulvovaginitis 06/17/2016  . Hypothyroidism (acquired) 06/17/2016  . Insomnia 06/17/2016  . Malaise and fatigue 06/17/2016  . Polycystic kidney disease 06/17/2016  . Primary osteoarthritis involving multiple joints 06/17/2016  . Allergic rhinitis 08/07/2014  . Anxiety state 08/07/2014  . Cough 08/07/2014  . Other amnesia 08/07/2014  . Other fatigue 08/07/2014  . Severe episode of recurrent major depressive disorder, with psychotic features ([place]) 08/07/2014  . Tobacco use 08/07/2014   Past Medical History:  Diagnosis Date  . Acid  reflux   . Cancer ([place])    ovarian cancer  . Cataract   . Chronic kidney disease    Stage 3  . Colitis   . Compound fracture    L1  . COPD (chronic obstructive pulmonary disease) ([place])   . Hepatitis C   . Hypertension   . Neck fracture ([place])   . Neuromuscular disorder ([facility])    neuropathy  . Numbness and tingling   . Thyroid disease   . Tremor     Family History  Problem Relation Age of Onset  . Other Mother        unsure of history  . Diabetes Father   . Hypertension Father   . Colon cancer Father   . Hypertension Sister   . Hypertension Brother     Past Surgical History:  Procedure Laterality Date  . CHOLECYSTECTOMY    . FOOT SURGERY    . IR VERTEBROPLASTY LUMBAR BX INC UNI/BIL INC/INJECT/IMAGING  08/21/2019  . KNEE ARTHROSCOPY    . NECK SURGERY    . SHOULDER ARTHROSCOPY    . VAGINAL HYSTERECTOMY     Social History   Occupational History  . Occupation: Disabled  Tobacco Use  . Smoking status: Current Every Day Smoker    Packs/day: 0.25    Years: 37.00    Pack years: 9.25    Types: Cigarettes  . Smokeless tobacco: Never Used  Substance and Sexual  Activity  . Alcohol use: Yes    Comment: RARELY  . Drug use: Never  . Sexual activity: Not on file

## 2019-11-12 NOTE — Patient Instructions (Signed)
Avoid overhead lifting and overhead use of the arms. Do not lift greater than 5 lbs. Adjust head rest in vehicle to prevent hyperextension if rear ended. Take extra precautions to avoid falling.well and allograft bone graft and vivigen.  Risks of surgery include risks of infection, bleeding and risks to the spinal cord and  Risks of sore throat and difficulty swallowing which should  Improve over the next 4-6 weeks following surgery. Fall Prevention and Home Safety Falls cause injuries and can affect all age groups. It is possible to use preventive measures to significantly decrease the likelihood of falls. There are many simple measures which can make your home safer and prevent falls. OUTDOORS  Repair cracks and edges of walkways and driveways.  Remove high doorway thresholds.  Trim shrubbery on the main path into your home.  Have good outside lighting.  Clear walkways of tools, rocks, debris, and clutter.  Check that handrails are not broken and are securely fastened. Both sides of steps should have handrails.  Have leaves, snow, and ice cleared regularly.  Use sand or salt on walkways during winter months.  In the garage, clean up grease or oil spills. BATHROOM  Install night lights.  Install grab bars by the toilet and in the tub and shower.  Use non-skid mats or decals in the tub or shower.  Place a plastic non-slip stool in the shower to sit on, if needed.  Keep floors dry and clean up all water on the floor immediately.  Remove soap buildup in the tub or shower on a regular basis.  Secure bath mats with non-slip, double-sided rug tape.  Remove throw rugs and tripping hazards from the floors. BEDROOMS  Install night lights.  Make sure a bedside light is easy to reach.  Do not use oversized bedding.  Keep a telephone by your bedside.  Have a firm chair with side arms to use for getting dressed.  Remove throw rugs and tripping hazards from the  floor. KITCHEN  Keep handles on pots and pans turned toward the center of the stove. Use back burners when possible.  Clean up spills quickly and allow time for drying.  Avoid walking on wet floors.  Avoid hot utensils and knives.  Position shelves so they are not too high or low.  Place commonly used objects within easy reach.  If necessary, use a sturdy step stool with a grab bar when reaching.  Keep electrical cables out of the way.  Do not use floor polish or wax that makes floors slippery. If you must use wax, use non-skid floor wax.  Remove throw rugs and tripping hazards from the floor. STAIRWAYS  Never leave objects on stairs.  Place handrails on both sides of stairways and use them. Fix any loose handrails. Make sure handrails on both sides of the stairways are as long as the stairs.  Check carpeting to make sure it is firmly attached along stairs. Make repairs to worn or loose carpet promptly.  Avoid placing throw rugs at the top or bottom of stairways, or properly secure the rug with carpet tape to prevent slippage. Get rid of throw rugs, if possible.  Have an electrician put in a light switch at the top and bottom of the stairs. OTHER FALL PREVENTION TIPS  Wear low-heel or rubber-soled shoes that are supportive and fit well. Wear closed toe shoes.  When using a stepladder, make sure it is fully opened and both spreaders are firmly locked. Do not climb a  closed stepladder.  Add color or contrast paint or tape to grab bars and handrails in your home. Place contrasting color strips on first and last steps.  Learn and use mobility aids as needed. Install an electrical emergency response system.  Turn on lights to avoid dark areas. Replace light bulbs that burn out immediately. Get light switches that glow.  Arrange furniture to create clear pathways. Keep furniture in the same place.  Firmly attach carpet with non-skid or double-sided tape.  Eliminate uneven  floor surfaces.  Select a carpet pattern that does not visually hide the edge of steps.  Be aware of all pets. OTHER HOME SAFETY TIPS  Set the water temperature for 120 F (48.8 C).  Keep emergency numbers on or near the telephone.  Keep smoke detectors on every level of the home and near sleeping areas. Document Released: 10/01/2002 Document Revised: 04/11/2012 Document Reviewed: 12/31/2011 [facility] Patient Information 2014 [place].

## 2019-11-26 ENCOUNTER — Other Ambulatory Visit (HOSPITAL_BASED_OUTPATIENT_CLINIC_OR_DEPARTMENT_OTHER): Payer: Medicare Other

## 2019-11-28 ENCOUNTER — Other Ambulatory Visit (HOSPITAL_BASED_OUTPATIENT_CLINIC_OR_DEPARTMENT_OTHER): Payer: Medicare Other

## 2019-11-28 ENCOUNTER — Other Ambulatory Visit: Payer: Self-pay

## 2019-11-28 ENCOUNTER — Ambulatory Visit (HOSPITAL_BASED_OUTPATIENT_CLINIC_OR_DEPARTMENT_OTHER)
Admission: RE | Admit: 2019-11-28 | Discharge: 2019-11-28 | Disposition: A | Discharge status: Home / Self Care | Payer: Medicare Other | Source: Ambulatory Visit | Attending: Specialist | Admitting: Specialist

## 2019-11-28 DIAGNOSIS — M960 Pseudarthrosis after fusion or arthrodesis: Secondary | ICD-10-CM | POA: Insufficient documentation

## 2019-12-04 ENCOUNTER — Other Ambulatory Visit: Payer: Self-pay

## 2019-12-04 ENCOUNTER — Ambulatory Visit
Admission: RE | Admit: 2019-12-04 | Discharge: 2019-12-04 | Disposition: A | Discharge status: Home / Self Care | Payer: Medicare Other | Source: Ambulatory Visit | Attending: Neurology | Admitting: Neurology

## 2019-12-04 DIAGNOSIS — R200 Anesthesia of skin: Secondary | ICD-10-CM | POA: Diagnosis not present

## 2019-12-05 ENCOUNTER — Telehealth: Payer: Self-pay | Admitting: Neurology

## 2019-12-05 NOTE — Telephone Encounter (Signed)
Please call patient, MRI of the brain showed mild age-related changes, there was no acute abnormalities.  IMPRESSION:   MRI brain (without) demonstrating: - Few mild periventricular and subcortical foci of nonspecific T2 hyperintensities. Considerations include autoimmune, inflammatory, post-infectious, microvascular ischemic or migraine associated etiologies.  - No acute findings

## 2019-12-05 NOTE — Telephone Encounter (Signed)
I spoke to the patient and she verbalized understanding of her MRI results. She has a copy of the disc but would like a printed copy of the results mailed to her home address.

## 2019-12-17 ENCOUNTER — Telehealth: Payer: Self-pay | Admitting: Specialist

## 2019-12-17 NOTE — Telephone Encounter (Signed)
Patient called.   She was told by Louanne Skye to come in as soon as possible so that he could evaluate her neck. I gave her the next available but she is dissatisfied as she feels she should be seen earlier. She refuses to see Aaron Edelman or anyone else.   Call back number: [phone]

## 2019-12-18 NOTE — Telephone Encounter (Signed)
I called patient and advised that I would put her on the cancellation list so I would be calling her if an app opened up sooner

## 2019-12-21 ENCOUNTER — Other Ambulatory Visit: Payer: Self-pay

## 2019-12-21 ENCOUNTER — Ambulatory Visit (INDEPENDENT_AMBULATORY_CARE_PROVIDER_SITE_OTHER): Payer: Medicare Other | Admitting: Specialist

## 2019-12-21 ENCOUNTER — Encounter: Payer: Self-pay | Admitting: Specialist

## 2019-12-21 VITALS — BP 114/73 | HR 88 | Ht 64.0 in | Wt 163.0 lb

## 2019-12-21 DIAGNOSIS — G8929 Other chronic pain: Secondary | ICD-10-CM

## 2019-12-21 DIAGNOSIS — M542 Cervicalgia: Secondary | ICD-10-CM

## 2019-12-21 DIAGNOSIS — M960 Pseudarthrosis after fusion or arthrodesis: Secondary | ICD-10-CM | POA: Diagnosis not present

## 2019-12-21 DIAGNOSIS — M5136 Other intervertebral disc degeneration, lumbar region: Secondary | ICD-10-CM

## 2019-12-21 DIAGNOSIS — M5020 Other cervical disc displacement, unspecified cervical region: Secondary | ICD-10-CM

## 2019-12-21 DIAGNOSIS — M25512 Pain in left shoulder: Secondary | ICD-10-CM

## 2019-12-21 DIAGNOSIS — M545 Low back pain: Secondary | ICD-10-CM

## 2019-12-21 DIAGNOSIS — M5030 Other cervical disc degeneration, unspecified cervical region: Secondary | ICD-10-CM

## 2019-12-21 NOTE — Patient Instructions (Signed)
Plan: Avoid overhead lifting and overhead use of the arms. Do not lift greater than 5 lbs. Adjust head rest in vehicle to prevent hyperextension if rear ended. Take extra precautions to avoid falling, including use of a cane if you feel weak. Fall Prevention and Home Safety Falls cause injuries and can affect all age groups. It is possible to use preventive measures to significantly decrease the likelihood of falls. There are many simple measures which can make your home safer and prevent falls. OUTDOORS  Repair cracks and edges of walkways and driveways.  Remove high doorway thresholds.  Trim shrubbery on the main path into your home.  Have good outside lighting.  Clear walkways of tools, rocks, debris, and clutter.  Check that handrails are not broken and are securely fastened. Both sides of steps should have handrails.  Have leaves, snow, and ice cleared regularly.  Use sand or salt on walkways during winter months.  In the garage, clean up grease or oil spills. BATHROOM  Install night lights.  Install grab bars by the toilet and in the tub and shower.  Use non-skid mats or decals in the tub or shower.  Place a plastic non-slip stool in the shower to sit on, if needed.  Keep floors dry and clean up all water on the floor immediately.  Remove soap buildup in the tub or shower on a regular basis.  Secure bath mats with non-slip, double-sided rug tape.  Remove throw rugs and tripping hazards from the floors. BEDROOMS  Install night lights.  Make sure a bedside light is easy to reach.  Do notuse oversized bedding.  Keep a telephone by your bedside.  Have a firm chair with side arms to use for getting dressed.  Remove throw rugs and tripping hazards from the floor. KITCHEN  Keep handles on pots and pans turned toward the center of the stove. Use back burners when possible.  Clean up spills quickly and allow time for drying.  Avoid walking on wet  floors.  Avoid hot utensils and knives.  Position shelves so they are not too high or low.  Place commonly used objects within easy reach.  If necessary, use a sturdy step stool with a grab bar when reaching.  Keep electrical cables out of the way.  Do notuse floor polish or wax that makes floors slippery. If you must use wax, use non-skid floor wax.  Remove throw rugs and tripping hazards from the floor. STAIRWAYS  Never leave objects on stairs.  Place handrails on both sides of stairways and use them. Fix any loose handrails. Make sure handrails on both sides of the stairways are as long as the stairs.  Check carpeting to make sure it is firmly attached along stairs. Make repairs to worn or loose carpet promptly.  Avoid placing throw rugs at the top or bottom of stairways, or properly secure the rug with carpet tape to prevent slippage. Get rid of throw rugs, if possible.  Have an electrician put in a light switch at the top and bottom of the stairs. OTHER FALL PREVENTION TIPS  Wear low-heel or rubber-soled shoes that are supportive and fit well. Wear closed toe shoes.  When using a stepladder, make sure it is fully opened and both spreaders are firmly locked. Do notclimb a closed stepladder.  Add color or contrast paint or tape to grab bars and handrails in your home. Place contrasting color strips on first and last steps.  Learn and use mobility aids as  needed. Install an electrical emergency response system.  Turn on lights to avoid dark areas. Replace light bulbs that burn out immediately. Get light switches that glow.  Arrange furniture to create clear pathways. Keep furniture in the same place.  Firmly attach carpet with non-skid or double-sided tape.  Eliminate uneven floor surfaces.  Select a carpet pattern that does not visually hide the edge of steps.  Be aware of all pets. OTHER HOME SAFETY TIPS  Set the water temperature for 120 F (48.8 C).  Keep  emergency numbers on or near the telephone.  Keep smoke detectors on every level of the home and near sleeping areas. Document Released: 10/01/2002 Document Revised: 04/11/2012 Document Reviewed: 12/31/2011 [facility] Patient Information 2014 [place]. CT of the cervical spine to assess the area of previous fusion C6-7 for healing or non union.   Follow-Up Instructions: No follow-ups on file.

## 2019-12-21 NOTE — Progress Notes (Signed)
Office Visit Note   Patient: Raven Hansen           Date of Birth: [birth year]           MRN: [id number] Visit Date: 12/21/2019              Requested by: [name], MD [place],  [place] [address] PCP: [name], MD   Assessment & Plan: Visit Diagnoses:  1. Failed cervical fusion   2. Other cervical disc degeneration, unspecified cervical region   3. Chronic left shoulder pain   4. Herniated cervical disc   5. Pseudarthrosis after fusion or arthrodesis   6. Cervicalgia   7. Chronic low back pain, unspecified back pain laterality, unspecified whether sciatica present   8. Degenerative disc disease, lumbar     Plan:  Plan: Avoid overhead lifting and overhead use of the arms. Do not lift greater than 5 lbs. Adjust head rest in vehicle to prevent hyperextension if rear ended. Take extra precautions to avoid falling, including use of a cane if you feel weak. Fall Prevention and Home Safety Falls cause injuries and can affect all age groups. It is possible to use preventive measures to significantly decrease the likelihood of falls. There are many simple measures which can make your home safer and prevent falls. OUTDOORS  Repair cracks and edges of walkways and driveways.  Remove high doorway thresholds.  Trim shrubbery on the main path into your home.  Have good outside lighting.  Clear walkways of tools, rocks, debris, and clutter.  Check that handrails are not broken and are securely fastened. Both sides of steps should have handrails.  Have leaves, snow, and ice cleared regularly.  Use sand or salt on walkways during winter months.  In the garage, clean up grease or oil spills. BATHROOM  Install night lights.  Install grab bars by the toilet and in the tub and shower.  Use non-skid mats or decals in the tub or shower.  Place a plastic non-slip stool in the shower to sit on, if needed.  Keep floors dry and clean up all water  on the floor immediately.  Remove soap buildup in the tub or shower on a regular basis.  Secure bath mats with non-slip, double-sided rug tape.  Remove throw rugs and tripping hazards from the floors. BEDROOMS  Install night lights.  Make sure a bedside light is easy to reach.  Do notuse oversized bedding.  Keep a telephone by your bedside.  Have a firm chair with side arms to use for getting dressed.  Remove throw rugs and tripping hazards from the floor. KITCHEN  Keep handles on pots and pans turned toward the center of the stove. Use back burners when possible.  Clean up spills quickly and allow time for drying.  Avoid walking on wet floors.  Avoid hot utensils and knives.  Position shelves so they are not too high or low.  Place commonly used objects within easy reach.  If necessary, use a sturdy step stool with a grab bar when reaching.  Keep electrical cables out of the way.  Do notuse floor polish or wax that makes floors slippery. If you must use wax, use non-skid floor wax.  Remove throw rugs and tripping hazards from the floor. STAIRWAYS  Never leave objects on stairs.  Place handrails on both sides of stairways and use them. Fix any loose handrails. Make sure handrails on both sides of the stairways are as  long as the stairs.  Check carpeting to make sure it is firmly attached along stairs. Make repairs to worn or loose carpet promptly.  Avoid placing throw rugs at the top or bottom of stairways, or properly secure the rug with carpet tape to prevent slippage. Get rid of throw rugs, if possible.  Have an electrician put in a light switch at the top and bottom of the stairs. OTHER FALL PREVENTION TIPS  Wear low-heel or rubber-soled shoes that are supportive and fit well. Wear closed toe shoes.  When using a stepladder, make sure it is fully opened and both spreaders are firmly locked. Do notclimb a closed stepladder.  Add color or contrast paint  or tape to grab bars and handrails in your home. Place contrasting color strips on first and last steps.  Learn and use mobility aids as needed. Install an electrical emergency response system.  Turn on lights to avoid dark areas. Replace light bulbs that burn out immediately. Get light switches that glow.  Arrange furniture to create clear pathways. Keep furniture in the same place.  Firmly attach carpet with non-skid or double-sided tape.  Eliminate uneven floor surfaces.  Select a carpet pattern that does not visually hide the edge of steps.  Be aware of all pets. OTHER HOME SAFETY TIPS  Set the water temperature for 120 F (48.8 C).  Keep emergency numbers on or near the telephone.  Keep smoke detectors on every level of the home and near sleeping areas. Document Released: 10/01/2002 Document Revised: 04/11/2012 Document Reviewed: 12/31/2011 [facility] Patient Information 2014 [place]. CT of the cervical spine to assess the area of previous fusion C6-7 for healing or non union.   Follow-Up Instructions: No follow-ups on file.    Follow-Up Instructions: Return in about 3 weeks (around 01/11/2020).   Orders:  No orders of the defined types were placed in this encounter.  No orders of the defined types were placed in this encounter.     Procedures: No procedures performed   Clinical Data: No additional findings.   Subjective: Chief Complaint  Patient presents with  . Neck - Follow-up    CT Scan Review    HPI  Review of Systems   Objective: Vital Signs: BP 114/73 (BP Location: Left Arm, Patient Position: Sitting)   Pulse 88   Ht 5\' 4"  (1.626 m)   Wt 163 lb (73.9 kg)   BMI 27.98 kg/m   Physical Exam  Ortho Exam  Specialty Comments:  No specialty comments available.  Imaging: No results found.   PMFS History: Patient Active Problem List   Diagnosis Date Noted  . Pseudogout of left knee 11/06/2019  . Pseudogout of right knee  11/06/2019  . COPD with acute exacerbation ([place]) 09/25/2019  . AKI (acute kidney injury) ([place]) 09/25/2019  . Chronic back pain 09/25/2019  . Acute on chronic kidney failure ([place]) 09/25/2019  . Acute respiratory failure with hypoxemia ([place]) 09/25/2019  . Numbness 08/30/2019  . Confusion 08/30/2019  . Chondrocalcinosis due to dicalcium phosphate crystals, of the knee, right 04/12/2019  . Chronic pain of both shoulders 01/30/2019  . Bilateral primary osteoarthritis of knee 01/08/2019  . Exposure to communicable disease 05/09/2018  . Flying phobia 05/09/2018  . H/O bee sting allergy 05/09/2018  . Mixed hyperlipidemia 05/09/2018  . Allergic contact dermatitis 04/14/2018  . Preop general physical exam 12/14/2017  . Mass of right hand 07/14/2017  . Cat scratch of forearm, right, initial encounter 06/30/2017  . Family history  of breast cancer 06/22/2017  . History of ovarian cancer 06/22/2017  . Bipolar affective disorder in remission ([place]) 04/13/2017  . Allergic dermatitis due to poison ivy 03/10/2017  . Myofascial pain 03/10/2017  . Acute bronchitis due to Mycoplasma pneumoniae 02/17/2017  . Anemia 02/17/2017  . Cervical radiculopathy 02/17/2017  . Chronic pain syndrome 02/17/2017  . Complete tear of anterior cruciate ligament of knee 02/17/2017  . Dental infection 02/17/2017  . Epidermal cyst 02/17/2017  . Failed back surgical syndrome 02/17/2017  . Hypoglycemia 02/17/2017  . Lesion of nipple 02/17/2017  . DJD (degenerative joint disease) of knee 02/17/2017  . Petechiae 02/17/2017  . Impingement syndrome of right shoulder 02/17/2017  . Seizures ([place]) 02/17/2017  . Anxiety and depression 01/19/2017  . Cervical disc disease 01/19/2017  . Chronic bronchitis ([place]) 01/19/2017  . Contusion of right great toe without damage to nail 01/19/2017  . Migraine aura, persistent, intractable 01/19/2017  . Viral upper respiratory tract infection 01/19/2017  . Chronic diarrhea 01/14/2017  .  Bipolar 2 disorder, major depressive episode ([place]) 08/26/2016  . Tobacco use disorder, severe, dependence 08/26/2016  . Benzodiazepine dependence ([place]) 08/25/2016  . Pain in right knee 07/02/2016  . Dry eye syndrome 06/17/2016  . Episode of recurrent major depressive disorder ([place]) 06/17/2016  . Essential hypertension 06/17/2016  . GERD without esophagitis 06/17/2016  . Hepatitis C 06/17/2016  . Herniated cervical disc 06/17/2016  . Herpes simplex vulvovaginitis 06/17/2016  . Hypothyroidism (acquired) 06/17/2016  . Insomnia 06/17/2016  . Malaise and fatigue 06/17/2016  . Polycystic kidney disease 06/17/2016  . Primary osteoarthritis involving multiple joints 06/17/2016  . Allergic rhinitis 08/07/2014  . Anxiety state 08/07/2014  . Cough 08/07/2014  . Other amnesia 08/07/2014  . Other fatigue 08/07/2014  . Severe episode of recurrent major depressive disorder, with psychotic features ([place]) 08/07/2014  . Tobacco use 08/07/2014   Past Medical History:  Diagnosis Date  . Acid reflux   . Cancer ([place])    ovarian cancer  . Cataract   . Chronic kidney disease    Stage 3  . Colitis   . Compound fracture    L1  . COPD (chronic obstructive pulmonary disease) ([place])   . Hepatitis C   . Hypertension   . Neck fracture ([place])   . Neuromuscular disorder ([facility])    neuropathy  . Numbness and tingling   . Thyroid disease   . Tremor     Family History  Problem Relation Age of Onset  . Other Mother        unsure of history  . Diabetes Father   . Hypertension Father   . Colon cancer Father   . Hypertension Sister   . Hypertension Brother     Past Surgical History:  Procedure Laterality Date  . CHOLECYSTECTOMY    . FOOT SURGERY    . IR VERTEBROPLASTY LUMBAR BX INC UNI/BIL INC/INJECT/IMAGING  08/21/2019  . KNEE ARTHROSCOPY    . NECK SURGERY    . SHOULDER ARTHROSCOPY    . VAGINAL HYSTERECTOMY     Social History   Occupational History  . Occupation: Disabled  Tobacco Use  .  Smoking status: Current Every Day Smoker    Packs/day: 0.25    Years: 37.00    Pack years: 9.25    Types: Cigarettes  . Smokeless tobacco: Never Used  Substance and Sexual Activity  . Alcohol use: Yes    Comment: RARELY  . Drug use: Never  . Sexual activity: Not on  file

## 2020-01-01 ENCOUNTER — Telehealth: Payer: Self-pay | Admitting: Radiology

## 2020-01-01 ENCOUNTER — Telehealth: Payer: Self-pay

## 2020-01-01 NOTE — Telephone Encounter (Signed)
Can you please check on the status of the MRI for this patient? I see that you sent the order to [facility] on 12/26/2019.

## 2020-01-01 NOTE — Telephone Encounter (Signed)
Patient called.   She was calling to cancel her MRI Review as it has not yet been scheduled. I informed her that it had already been moved. She is now questioning the status of her MRI and would like a call back.   Call back: [phone]

## 2020-01-01 NOTE — Telephone Encounter (Signed)
Pt called and stated she hasnt heard back yet about scheduling her MRI for her neck and shoulder. She stated she wants to get this done at the same time. She stated she wants dr. Durward Fortes to order this and be under dr. Wonda Horner care only not Dr. Arvil Persons Please advise

## 2020-01-01 NOTE — Telephone Encounter (Signed)
I only see an MRI order for C-Spine under [facility]. No shoulder MRI. Please advise. Should she come back in for an exam with you?

## 2020-01-02 NOTE — Telephone Encounter (Signed)
Tried to call patient. Father answered. I left message with him that Dr.Whitfield will see patient for shoulder, but not her back or neck. I explained that she needs to continue with [facility] for her neck and back, or we can refer her elsewhere, per Dr.Whitfield. He will relay message and have her call if she has any other questions.

## 2020-01-02 NOTE — Telephone Encounter (Signed)
Ok to schedule MRI of shoulder-Dr Louanne Skye is evaluating her neck and back

## 2020-01-04 ENCOUNTER — Ambulatory Visit: Payer: Medicare Other | Admitting: Specialist

## 2020-01-05 ENCOUNTER — Ambulatory Visit (HOSPITAL_BASED_OUTPATIENT_CLINIC_OR_DEPARTMENT_OTHER): Payer: Medicare Other

## 2020-01-09 ENCOUNTER — Ambulatory Visit (INDEPENDENT_AMBULATORY_CARE_PROVIDER_SITE_OTHER): Payer: Medicare Other | Admitting: Orthopaedic Surgery

## 2020-01-09 ENCOUNTER — Other Ambulatory Visit: Payer: Self-pay

## 2020-01-09 ENCOUNTER — Encounter: Payer: Self-pay | Admitting: Orthopaedic Surgery

## 2020-01-09 VITALS — Ht 64.0 in | Wt 161.0 lb

## 2020-01-09 DIAGNOSIS — G8929 Other chronic pain: Secondary | ICD-10-CM

## 2020-01-09 DIAGNOSIS — M7541 Impingement syndrome of right shoulder: Secondary | ICD-10-CM

## 2020-01-09 MED ORDER — LIDOCAINE HCL 2 % IJ SOLN
2.0000 mL | INTRAMUSCULAR | Status: AC | PRN
  Administered 2020-01-09: 2 mL

## 2020-01-09 MED ORDER — METHYLPREDNISOLONE ACETATE 40 MG/ML IJ SUSP
80.0000 mg | INTRAMUSCULAR | Status: AC | PRN
  Administered 2020-01-09: 80 mg via INTRA_ARTICULAR

## 2020-01-09 MED ORDER — BUPIVACAINE HCL 0.5 % IJ SOLN
2.0000 mL | INTRAMUSCULAR | Status: AC | PRN
  Administered 2020-01-09: 2 mL via INTRA_ARTICULAR

## 2020-01-09 NOTE — Progress Notes (Signed)
Office Visit Note   Patient: Raven Hansen           Date of Birth: June 05, 1961           MRN: [id number] Visit Date: 01/09/2020              Requested by: [name], MD [id number] [place],  [place] [id number] PCP: [name], MD   Assessment & Plan: Visit Diagnoses:  1. Chronic right shoulder pain   2. Impingement syndrome of right shoulder     Plan: Recurrent symptoms of impingement syndrome right shoulder.  Long discussion regarding repetitive cortisone injections.  She is going back to [place] for 2 months and she can follow-up with orthopedist there.  Has been followed by Dr. Louanne Skye for her neck and her back.  Has been some problem obtaining the MRI scan of her right shoulder so we will reorder  Follow-Up Instructions: Return if symptoms worsen or fail to improve.   Orders:  Orders Placed This Encounter  Procedures  . Large Joint Inj: R subacromial bursa  . MR SHOULDER RIGHT WO CONTRAST   No orders of the defined types were placed in this encounter.     Procedures: Large Joint Inj: R subacromial bursa on 01/09/2020 3:02 PM Indications: pain and diagnostic evaluation Details: 25 G 1.5 in needle, anterolateral approach  Arthrogram: No  Medications: 2 mL lidocaine 2 %; 2 mL bupivacaine 0.5 %; 80 mg methylPREDNISolone acetate 40 MG/ML Consent was given by the patient. Immediately prior to procedure a time out was called to verify the correct patient, procedure, equipment, support staff and site/side marked as required. Patient was prepped and draped in the usual sterile fashion.       Clinical Data: No additional findings.   Subjective: Chief Complaint  Patient presents with  . Right Shoulder - Follow-up  Patient presents today for chronic right shoulder pain. She is wanting to get a cortisone injection today. Her last injection into her shoulder was on 10/15/2020. She is also wanting to obtain an MRI of her right shoulder.  She is in pain management and out of pain medicine until she can get more filled in four days.   HPI  Review of Systems   Objective: Vital Signs: Ht 5\' 4"  (1.626 m)   Wt 161 lb (73 kg)   BMI 27.64 kg/m   Physical Exam Constitutional:      Appearance: She is well-developed.  Eyes:     Pupils: Pupils are equal, round, and reactive to light.  Pulmonary:     Effort: Pulmonary effort is normal.  Skin:    General: Skin is warm and dry.  Neurological:     Mental Status: She is alert and oriented to person, place, and time.  Psychiatric:        Behavior: Behavior normal.     Ortho Exam right shoulder with positive impingement.  No grating or crepitation.  Skin intact.  Some pain along the anterior subacromial region but not at the [facility] joint.  Appears to have good strength.  No loss of overhead motion Specialty Comments:  No specialty comments available.  Imaging: No results found.   PMFS History: Patient Active Problem List   Diagnosis Date Noted  . Pseudogout of left knee 11/06/2019  . Pseudogout of right knee 11/06/2019  . COPD with acute exacerbation ([place]) 09/25/2019  . AKI (acute kidney injury) ([place]) 09/25/2019  . Chronic back pain 09/25/2019  . Acute  on chronic kidney failure ([place]) 09/25/2019  . Acute respiratory failure with hypoxemia ([place]) 09/25/2019  . Numbness 08/30/2019  . Confusion 08/30/2019  . Chondrocalcinosis due to dicalcium phosphate crystals, of the knee, right 04/12/2019  . Chronic pain of both shoulders 01/30/2019  . Bilateral primary osteoarthritis of knee 01/08/2019  . Exposure to communicable disease 05/09/2018  . Flying phobia 05/09/2018  . H/O bee sting allergy 05/09/2018  . Mixed hyperlipidemia 05/09/2018  . Allergic contact dermatitis 04/14/2018  . Preop general physical exam 12/14/2017  . Mass of right hand 07/14/2017  . Cat scratch of forearm, right, initial encounter 06/30/2017  . Family history of breast cancer 06/22/2017  . History  of ovarian cancer 06/22/2017  . Bipolar affective disorder in remission ([place]) 04/13/2017  . Allergic dermatitis due to poison ivy 03/10/2017  . Myofascial pain 03/10/2017  . Acute bronchitis due to Mycoplasma pneumoniae 02/17/2017  . Anemia 02/17/2017  . Cervical radiculopathy 02/17/2017  . Chronic pain syndrome 02/17/2017  . Complete tear of anterior cruciate ligament of knee 02/17/2017  . Dental infection 02/17/2017  . Epidermal cyst 02/17/2017  . Failed back surgical syndrome 02/17/2017  . Hypoglycemia 02/17/2017  . Lesion of nipple 02/17/2017  . DJD (degenerative joint disease) of knee 02/17/2017  . Petechiae 02/17/2017  . Impingement syndrome of right shoulder 02/17/2017  . Seizures ([place]) 02/17/2017  . Anxiety and depression 01/19/2017  . Cervical disc disease 01/19/2017  . Chronic bronchitis ([place]) 01/19/2017  . Contusion of right great toe without damage to nail 01/19/2017  . Migraine aura, persistent, intractable 01/19/2017  . Viral upper respiratory tract infection 01/19/2017  . Chronic diarrhea 01/14/2017  . Bipolar 2 disorder, major depressive episode ([place]) 08/26/2016  . Tobacco use disorder, severe, dependence 08/26/2016  . Benzodiazepine dependence ([place]) 08/25/2016  . Pain in right knee 07/02/2016  . Dry eye syndrome 06/17/2016  . Episode of recurrent major depressive disorder ([place]) 06/17/2016  . Essential hypertension 06/17/2016  . GERD without esophagitis 06/17/2016  . Hepatitis C 06/17/2016  . Herniated cervical disc 06/17/2016  . Herpes simplex vulvovaginitis 06/17/2016  . Hypothyroidism (acquired) 06/17/2016  . Insomnia 06/17/2016  . Malaise and fatigue 06/17/2016  . Polycystic kidney disease 06/17/2016  . Primary osteoarthritis involving multiple joints 06/17/2016  . Allergic rhinitis 08/07/2014  . Anxiety state 08/07/2014  . Cough 08/07/2014  . Other amnesia 08/07/2014  . Other fatigue 08/07/2014  . Severe episode of recurrent major depressive  disorder, with psychotic features ([place]) 08/07/2014  . Tobacco use 08/07/2014   Past Medical History:  Diagnosis Date  . Acid reflux   . Cancer ([place])    ovarian cancer  . Cataract   . Chronic kidney disease    Stage 3  . Colitis   . Compound fracture    L1  . COPD (chronic obstructive pulmonary disease) ([place])   . Hepatitis C   . Hypertension   . Neck fracture ([place])   . Neuromuscular disorder (H[facility])    neuropathy  . Numbness and tingling   . Thyroid disease   . Tremor     Family History  Problem Relation Age of Onset  . Other Mother        unsure of history  . Diabetes Father   . Hypertension Father   . Colon cancer Father   . Hypertension Sister   . Hypertension Brother     Past Surgical History:  Procedure Laterality Date  . CHOLECYSTECTOMY    . FOOT SURGERY    .  IR VERTEBROPLASTY LUMBAR BX INC UNI/BIL INC/INJECT/IMAGING  08/21/2019  . KNEE ARTHROSCOPY    . NECK SURGERY    . SHOULDER ARTHROSCOPY    . VAGINAL HYSTERECTOMY     Social History   Occupational History  . Occupation: Disabled  Tobacco Use  . Smoking status: Current Every Day Smoker    Packs/day: 0.25    Years: 37.00    Pack years: 9.25    Types: Cigarettes  . Smokeless tobacco: Never Used  Substance and Sexual Activity  . Alcohol use: Yes    Comment: RARELY  . Drug use: Never  . Sexual activity: Not on file

## 2020-01-12 ENCOUNTER — Ambulatory Visit (HOSPITAL_BASED_OUTPATIENT_CLINIC_OR_DEPARTMENT_OTHER): Payer: Medicare Other

## 2020-01-17 ENCOUNTER — Ambulatory Visit: Payer: Medicare Other | Admitting: Specialist

## 2020-01-19 ENCOUNTER — Other Ambulatory Visit: Payer: Self-pay

## 2020-01-19 ENCOUNTER — Ambulatory Visit (HOSPITAL_BASED_OUTPATIENT_CLINIC_OR_DEPARTMENT_OTHER)
Admission: RE | Admit: 2020-01-19 | Discharge: 2020-01-19 | Disposition: A | Discharge status: Home / Self Care | Payer: Medicare Other | Source: Ambulatory Visit | Attending: Specialist | Admitting: Specialist

## 2020-01-19 ENCOUNTER — Ambulatory Visit (HOSPITAL_BASED_OUTPATIENT_CLINIC_OR_DEPARTMENT_OTHER)
Admission: RE | Admit: 2020-01-19 | Discharge: 2020-01-19 | Disposition: A | Discharge status: Home / Self Care | Payer: Medicare Other | Source: Ambulatory Visit | Attending: Orthopaedic Surgery | Admitting: Orthopaedic Surgery

## 2020-01-19 DIAGNOSIS — M5030 Other cervical disc degeneration, unspecified cervical region: Secondary | ICD-10-CM | POA: Insufficient documentation

## 2020-01-19 DIAGNOSIS — M25511 Pain in right shoulder: Secondary | ICD-10-CM | POA: Diagnosis present

## 2020-01-19 DIAGNOSIS — G8929 Other chronic pain: Secondary | ICD-10-CM | POA: Diagnosis present

## 2020-02-04 ENCOUNTER — Encounter: Payer: Self-pay | Admitting: Specialist

## 2020-02-04 ENCOUNTER — Telehealth: Payer: Self-pay | Admitting: *Deleted

## 2020-02-04 ENCOUNTER — Ambulatory Visit (INDEPENDENT_AMBULATORY_CARE_PROVIDER_SITE_OTHER): Payer: Medicare Other | Admitting: Specialist

## 2020-02-04 ENCOUNTER — Other Ambulatory Visit: Payer: Self-pay

## 2020-02-04 VITALS — BP 157/84 | HR 73 | Ht 64.0 in | Wt 157.0 lb

## 2020-02-04 DIAGNOSIS — M5136 Other intervertebral disc degeneration, lumbar region: Secondary | ICD-10-CM

## 2020-02-04 DIAGNOSIS — M7521 Bicipital tendinitis, right shoulder: Secondary | ICD-10-CM

## 2020-02-04 DIAGNOSIS — M5126 Other intervertebral disc displacement, lumbar region: Secondary | ICD-10-CM | POA: Diagnosis not present

## 2020-02-04 DIAGNOSIS — M4722 Other spondylosis with radiculopathy, cervical region: Secondary | ICD-10-CM

## 2020-02-04 DIAGNOSIS — M19011 Primary osteoarthritis, right shoulder: Secondary | ICD-10-CM

## 2020-02-04 DIAGNOSIS — M4322 Fusion of spine, cervical region: Secondary | ICD-10-CM

## 2020-02-04 NOTE — Patient Instructions (Signed)
Avoid overhead lifting and overhead use of the arms. Do not lift greater than 5 lbs. Adjust head rest in vehicle to prevent hyperextension if rear ended. Take extra precautions to avoid falling. Avoid overhead lifting and overhead use of the arms. Do not lift greater than 10 lbs. See Dr. Junius Roads for right Biceps tendonitis injection.  Referral to Dr. Ernestina Patches for right C7 selective nerve root block to see if the neck pain is improved with blocking this nerve that is within a severely narrowed opening where you had previous cervical spine fusion.

## 2020-02-04 NOTE — Progress Notes (Signed)
Office Visit Note   Patient: Raven Hansen           Date of Birth: 1961/03/21           MRN: [id number] Visit Date: 02/04/2020              Requested by: [name], MD [id number] [place],  [place] [address] PCP: [name], MD   Assessment & Plan: Visit Diagnoses:  1. Other spondylosis with radiculopathy, cervical region   2. Degenerative disc disease, lumbar   3. Herniated intervertebral disc of lumbar spine   4. Bicipital tendonitis of shoulder, right   5. Primary osteoarthritis, right shoulder   6. Fusion of spine of cervical region     Plan: Avoid overhead lifting and overhead use of the arms. Do not lift greater than 5 lbs. Adjust head rest in vehicle to prevent hyperextension if rear ended. Take extra precautions to avoid falling. Avoid overhead lifting and overhead use of the arms. Do not lift greater than 10 lbs. See Dr. Junius Roads for right Biceps tendonitis injection.  Referral to Dr. Ernestina Patches for right C7 selective nerve root block to see if the neck pain is improved with blocking this nerve that is within a severely narrowed opening where you had previous cervical spine fusion.  Follow-Up Instructions: No follow-ups on file.   Orders:  Orders Placed This Encounter  Procedures  . Ambulatory referral to Physical Medicine Rehab   No orders of the defined types were placed in this encounter.     Procedures: No procedures performed   Clinical Data: Findings:  CLINICAL DATA: Follow-up examination status post spinal fusion. Chronic neck and right shoulder pain extending into the right upper extremity.  EXAM: MRI CERVICAL SPINE WITHOUT CONTRAST  TECHNIQUE: Multiplanar, multisequence MR imaging of the cervical spine was performed. No intravenous contrast was administered.  COMPARISON: Prior MRI from 05/15/2019.  FINDINGS: Alignment: Straightening of the normal cervical lordosis. Trace 2 mm retrolisthesis of C5 on C6.  Appearance is stable from previous.  Vertebrae: Susceptibility artifact related to prior fusion at C6-7. Vertebral body height maintained without evidence for acute or chronic fracture. Bone marrow signal intensity mildly heterogeneous but within normal limits. No discrete or worrisome osseous lesions. No abnormal marrow edema.  Cord: Signal intensity within the cervical spinal cord is normal. Normal cord caliber morphology.  Posterior Fossa, vertebral arteries, paraspinal tissues: Probable minimal chronic microvascular ischemic change noted within the pons. Visualized brain and posterior fossa otherwise unremarkable. Craniocervical junction within normal limits. Paraspinous and prevertebral soft tissues within normal limits. Normal intravascular flow voids seen within the vertebral arteries bilaterally.  Disc levels:  C2-C3: Unremarkable.  C3-C4: Unremarkable.  C4-C5: Unremarkable.  C5-C6: Trace 2 mm retrolisthesis. Mild diffuse disc bulge with disc desiccation. Posterior annular fissure. Resultant mild spinal stenosis without cord deformity. Superimposed mild uncovertebral hypertrophy with resultant mild right C6 foraminal narrowing. No significant left foraminal encroachment.  C6-C7: Prior fusion. No residual spinal stenosis. Residual right greater than left uncovertebral hypertrophy with persistent severe right and moderate left C7 foraminal narrowing.  C7-T1: Negative interspace. Mild facet hypertrophy. No stenosis.  Visualized upper thoracic spine demonstrates no significant finding.  IMPRESSION: 1. Prior fusion at C6-7 without residual spinal stenosis. Residual right greater than left uncovertebral hypertrophy with resultant severe right and moderate left C7 foraminal narrowing. 2. Adjacent segment disease with disc bulge and uncovertebral hypertrophy at C5-6, resulting in mild canal with mild right C6 foraminal stenosis.  Electronically Signed By: Jeannine Boga M.D. On: 01/20/2020 21:14    Subjective: Chief Complaint  Patient presents with  . Neck - Follow-up  . Right Shoulder - Follow-up    59 year old right handed female with history of neck pain and right arm and shoulder pain. She has finding on her exam with right shoulder pain and right upper extremity neurogenic pain complaints.  Has had injection in the past in [place], [place] and previous cervical spine surgery fusion at the C6-7 and  Recently had MRI of the right shoulder and cervical spine.   Review of Systems   Objective: Vital Signs: BP (!) 157/84 (BP Location: Left Arm, Patient Position: Sitting)   Pulse 73   Ht 5\' 4"  (1.626 m)   Wt 157 lb (71.2 kg)   BMI 26.95 kg/m   Physical Exam  Ortho Exam  Specialty Comments:  No specialty comments available.  Imaging: No results found.   PMFS History: Patient Active Problem List   Diagnosis Date Noted  . Pseudogout of left knee 11/06/2019  . Pseudogout of right knee 11/06/2019  . COPD with acute exacerbation ([facility]) 09/25/2019  . AKI (acute kidney injury) ([facility]) 09/25/2019  . Chronic back pain 09/25/2019  . Acute on chronic kidney failure ([facility]) 09/25/2019  . Acute respiratory failure with hypoxemia ([facility]) 09/25/2019  . Numbness 08/30/2019  . Confusion 08/30/2019  . Chondrocalcinosis due to dicalcium phosphate crystals, of the knee, right 04/12/2019  . Chronic pain of both shoulders 01/30/2019  . Bilateral primary osteoarthritis of knee 01/08/2019  . Exposure to communicable disease 05/09/2018  . Flying phobia 05/09/2018  . H/O bee sting allergy 05/09/2018  . Mixed hyperlipidemia 05/09/2018  . Allergic contact dermatitis 04/14/2018  . Preop general physical exam 12/14/2017  . Mass of right hand 07/14/2017  . Cat scratch of forearm, right, initial encounter 06/30/2017  . Family history of breast cancer 06/22/2017  . History of ovarian cancer 06/22/2017  . Bipolar affective disorder in remission ([facility])  04/13/2017  . Allergic dermatitis due to poison ivy 03/10/2017  . Myofascial pain 03/10/2017  . Acute bronchitis due to Mycoplasma pneumoniae 02/17/2017  . Anemia 02/17/2017  . Cervical radiculopathy 02/17/2017  . Chronic pain syndrome 02/17/2017  . Complete tear of anterior cruciate ligament of knee 02/17/2017  . Dental infection 02/17/2017  . Epidermal cyst 02/17/2017  . Failed back surgical syndrome 02/17/2017  . Hypoglycemia 02/17/2017  . Lesion of nipple 02/17/2017  . DJD (degenerative joint disease) of knee 02/17/2017  . Petechiae 02/17/2017  . Impingement syndrome of right shoulder 02/17/2017  . Seizures ([facility]) 02/17/2017  . Anxiety and depression 01/19/2017  . Cervical disc disease 01/19/2017  . Chronic bronchitis ([facility]) 01/19/2017  . Contusion of right great toe without damage to nail 01/19/2017  . Migraine aura, persistent, intractable 01/19/2017  . Viral upper respiratory tract infection 01/19/2017  . Chronic diarrhea 01/14/2017  . Bipolar 2 disorder, major depressive episode ([facility]) 08/26/2016  . Tobacco use disorder, severe, dependence 08/26/2016  . Benzodiazepine dependence ([facility]) 08/25/2016  . Pain in right knee 07/02/2016  . Dry eye syndrome 06/17/2016  . Episode of recurrent major depressive disorder ([facility]) 06/17/2016  . Essential hypertension 06/17/2016  . GERD without esophagitis 06/17/2016  . Hepatitis C 06/17/2016  . Herniated cervical disc 06/17/2016  . Herpes simplex vulvovaginitis 06/17/2016  . Hypothyroidism (acquired) 06/17/2016  . Insomnia 06/17/2016  . Malaise and fatigue 06/17/2016  . Polycystic kidney disease 06/17/2016  . Primary osteoarthritis involving multiple joints  06/17/2016  . Allergic rhinitis 08/07/2014  . Anxiety state 08/07/2014  . Cough 08/07/2014  . Other amnesia 08/07/2014  . Other fatigue 08/07/2014  . Severe episode of recurrent major depressive disorder, with psychotic features ([facility]) 08/07/2014  . Tobacco use 08/07/2014   Past  Medical History:  Diagnosis Date  . Acid reflux   . Cancer ([facility])    ovarian cancer  . Cataract   . Chronic kidney disease    Stage 3  . Colitis   . Compound fracture    L1  . COPD (chronic obstructive pulmonary disease) ([facility])   . Hepatitis C   . Hypertension   . Neck fracture ([facility])   . Neuromuscular disorder ([facility])    neuropathy  . Numbness and tingling   . Thyroid disease   . Tremor     Family History  Problem Relation Age of Onset  . Other Mother        unsure of history  . Diabetes Father   . Hypertension Father   . Colon cancer Father   . Hypertension Sister   . Hypertension Brother     Past Surgical History:  Procedure Laterality Date  . CHOLECYSTECTOMY    . FOOT SURGERY    . IR VERTEBROPLASTY LUMBAR BX INC UNI/BIL INC/INJECT/IMAGING  08/21/2019  . KNEE ARTHROSCOPY    . NECK SURGERY    . SHOULDER ARTHROSCOPY    . VAGINAL HYSTERECTOMY     Social History   Occupational History  . Occupation: Disabled  Tobacco Use  . Smoking status: Current Every Day Smoker    Packs/day: 0.25    Years: 37.00    Pack years: 9.25    Types: Cigarettes  . Smokeless tobacco: Never Used  Substance and Sexual Activity  . Alcohol use: Yes    Comment: RARELY  . Drug use: Never  . Sexual activity: Not on file

## 2020-02-05 ENCOUNTER — Ambulatory Visit: Payer: Medicare Other | Admitting: Orthopaedic Surgery

## 2020-02-06 ENCOUNTER — Ambulatory Visit: Payer: Medicare Other | Admitting: Specialist

## 2020-02-07 ENCOUNTER — Other Ambulatory Visit: Payer: Self-pay | Admitting: Physical Medicine and Rehabilitation

## 2020-02-07 DIAGNOSIS — F411 Generalized anxiety disorder: Secondary | ICD-10-CM

## 2020-02-07 MED ORDER — DIAZEPAM 5 MG PO TABS: ORAL_TABLET | ORAL | 0 refills | Status: DC

## 2020-02-07 NOTE — Telephone Encounter (Signed)
Done

## 2020-02-07 NOTE — Progress Notes (Signed)
Pre-procedure diazepam ordered for pre-operative anxiety. Patient on significant combination of Soma and hydrocodone.

## 2020-02-08 ENCOUNTER — Ambulatory Visit: Payer: Self-pay

## 2020-02-08 ENCOUNTER — Telehealth: Payer: Self-pay

## 2020-02-08 ENCOUNTER — Ambulatory Visit (INDEPENDENT_AMBULATORY_CARE_PROVIDER_SITE_OTHER): Payer: Medicare Other | Admitting: Family Medicine

## 2020-02-08 ENCOUNTER — Other Ambulatory Visit: Payer: Self-pay

## 2020-02-08 ENCOUNTER — Encounter: Payer: Self-pay | Admitting: Family Medicine

## 2020-02-08 DIAGNOSIS — M7521 Bicipital tendinitis, right shoulder: Secondary | ICD-10-CM

## 2020-02-08 NOTE — Telephone Encounter (Signed)
Please advise 

## 2020-02-08 NOTE — Telephone Encounter (Signed)
Patient came in today and stated she would like to but under for the nerve block. Dr. Junius Roads did talk with the pt and explain why we dont place patients under for this procedure but pt was still insistent about going to someone that would put her to sleep. A message was sent to Dr. Louanne Skye as well.

## 2020-02-08 NOTE — Telephone Encounter (Signed)
Patient came in today to see Dr. Junius Roads for her shoulder injection and was discussing the nerve block. She stated she wants to get heavier sedation for that. If she cant be put under and wants IV sedation. Please call pt to discuss

## 2020-02-08 NOTE — Telephone Encounter (Signed)
Appt has been cancelled.  

## 2020-02-08 NOTE — Progress Notes (Signed)
Subjective: She is here for ultrasound-guided right long head biceps tendon injection.  Objective: She is tender directly over the biceps tendon and has pain with reaching laterally and overhead.  Procedure: Ultrasound-guided right biceps tendon sheath injection: After sterile prep with Betadine, injected 5 cc 1% lidocaine without epinephrine and 40 mg methylprednisolone into the swollen area of the biceps tendon sheath without complication.  She has very good immediate pain relief.

## 2020-02-12 ENCOUNTER — Encounter: Payer: Self-pay | Admitting: Orthopaedic Surgery

## 2020-02-12 ENCOUNTER — Other Ambulatory Visit: Payer: Self-pay

## 2020-02-12 ENCOUNTER — Ambulatory Visit (INDEPENDENT_AMBULATORY_CARE_PROVIDER_SITE_OTHER): Payer: Medicare Other | Admitting: Orthopaedic Surgery

## 2020-02-12 VITALS — Ht 64.0 in | Wt 160.0 lb

## 2020-02-12 DIAGNOSIS — S43431A Superior glenoid labrum lesion of right shoulder, initial encounter: Secondary | ICD-10-CM | POA: Insufficient documentation

## 2020-02-12 DIAGNOSIS — M172 Bilateral post-traumatic osteoarthritis of knee: Secondary | ICD-10-CM

## 2020-02-12 DIAGNOSIS — M19011 Primary osteoarthritis, right shoulder: Secondary | ICD-10-CM

## 2020-02-12 DIAGNOSIS — M170 Bilateral primary osteoarthritis of knee: Secondary | ICD-10-CM

## 2020-02-12 MED ORDER — LIDOCAINE HCL 1 % IJ SOLN
2.0000 mL | INTRAMUSCULAR | Status: AC | PRN
  Administered 2020-02-12: 2 mL

## 2020-02-12 MED ORDER — BUPIVACAINE HCL 0.25 % IJ SOLN
2.0000 mL | INTRAMUSCULAR | Status: AC | PRN
  Administered 2020-02-12: 2 mL via INTRA_ARTICULAR

## 2020-02-12 MED ORDER — METHYLPREDNISOLONE ACETATE 40 MG/ML IJ SUSP
80.0000 mg | INTRAMUSCULAR | Status: AC | PRN
  Administered 2020-02-12: 80 mg via INTRA_ARTICULAR

## 2020-02-12 MED ORDER — LIDOCAINE HCL 1 % IJ SOLN
2.0000 mL | INTRAMUSCULAR | Status: AC | PRN
  Administered 2020-02-12: 16:00:00 2 mL

## 2020-02-12 NOTE — Progress Notes (Signed)
Office Visit Note   Patient: Raven Hansen           Date of Birth: 1961-04-08           MRN: [id number] Visit Date: 02/12/2020              Requested by: [name], MD [id number] [place],  [place] [address] PCP: [name], MD   Assessment & Plan: Visit Diagnoses:  1. Post-traumatic osteoarthritis of both knees   2. Bilateral primary osteoarthritis of knee   3. Labral tear of shoulder, right, initial encounter   4. Primary osteoarthritis, right shoulder     Plan:  #1: Corticosteroid injection was given to both knees atraumatically. #2: MRI scan was reviewed with her and she is understanding of this pathology.  This time she does not desire any treatment options until after she comes back from her trip.  Face-to-face time spent with patient was greater than 40 minutes.  Greater than 50% of the time was spent in counseling and coordination of care.  Follow-Up Instructions: No follow-ups on file.   Orders:  No orders of the defined types were placed in this encounter.  No orders of the defined types were placed in this encounter.     Procedures: Large Joint Inj: bilateral knee on 02/12/2020 3:55 PM Indications: diagnostic evaluation Details: 25 G 1.5 in needle, anteromedial approach  Arthrogram: No  Medications (Right): 2 mL lidocaine 1 %; 80 mg methylPREDNISolone acetate 40 MG/ML; 2 mL bupivacaine 0.25 % Medications (Left): 2 mL lidocaine 1 %; 80 mg methylPREDNISolone acetate 40 MG/ML; 2 mL bupivacaine 0.25 % Consent was given by the patient. Immediately prior to procedure a time out was called to verify the correct patient, procedure, equipment, support staff and site/side marked as required. Patient was prepped and draped in the usual sterile fashion.       Clinical Data: No additional findings.   Subjective: Chief Complaint  Patient presents with  . Right Shoulder - Follow-up    MRI results  Patient presents today for  follow up on her right shoulder. She had an MRI done on 01/19/2020. She states that she already knows her problem. She states that today she is only here for bilateral knee injections. She had her right knee injected last on 11/06/2019. Her left knee was injected on 10/16/2019.   HPI  As above She has had history of problems with her knees in the past.  She apparently is going out of town to settle her father's estate and comes in today requesting injections of both knees so that she can get through this time.. Also we have performed a MRI scan of her right shoulder and her main complaints are that pain just along the lateral aspect of the shoulder below the glenohumeral joint.  Denies any recent history of injury or trauma.  She recently has had an injection by Dr. Eunice Blase for an ultrasound-guided right long head biceps tendon injection.  She still continues to have pain in the area of the proximal humerus at the biceps groove.  Review of Systems  Constitutional: Negative for fatigue.  HENT: Negative for ear pain.   Eyes: Negative for pain.  Respiratory: Negative for shortness of breath.   Cardiovascular: Negative for leg swelling.  Gastrointestinal: Positive for diarrhea. Negative for constipation.  Endocrine: Negative for cold intolerance and heat intolerance.  Genitourinary: Positive for difficulty urinating.  Musculoskeletal: Positive for joint swelling.  Skin: Negative  for rash.  Allergic/Immunologic: Negative for food allergies.  Neurological: Negative for weakness.  Hematological: Does not bruise/bleed easily.  Psychiatric/Behavioral: Negative for sleep disturbance.     Objective: Vital Signs: Ht 5\' 4"  (1.626 m)   Wt 160 lb (72.6 kg)   BMI 27.46 kg/m   Physical Exam Constitutional:      Appearance: Normal appearance. She is well-developed and normal weight.  HENT:     Head: Normocephalic.  Eyes:     Pupils: Pupils are equal, round, and reactive to light.  Pulmonary:       Effort: Pulmonary effort is normal.  Skin:    General: Skin is warm and dry.  Neurological:     Mental Status: She is alert and oriented to person, place, and time.  Psychiatric:        Mood and Affect: Mood normal.        Behavior: Behavior normal.        Thought Content: Thought content normal.        Judgment: Judgment normal.     Ortho Exam  Exam today the right shoulder reveals a positive empty can test.  She does not have much in the way of any crepitance at this time but she is also limiting her motion at this time secondary to pain.  She has tenderness along subacromial area.  In regards to her knee she has essentially full extension bilaterally with flexion to about 95 to 100 degrees.  Does have some patellofemoral crepitance with range of motion.  Ligamentously stable otherwise.  Specialty Comments:  No specialty comments available.  Imaging: MR Cervical Spine w/o contrast  Result Date: 01/20/2020 CLINICAL DATA:  Follow-up examination status post spinal fusion. Chronic neck and right shoulder pain extending into the right upper extremity. EXAM: MRI CERVICAL SPINE WITHOUT CONTRAST TECHNIQUE: Multiplanar, multisequence MR imaging of the cervical spine was performed. No intravenous contrast was administered. COMPARISON:  Prior MRI from 05/15/2019. FINDINGS: Alignment: Straightening of the normal cervical lordosis. Trace 2 mm retrolisthesis of C5 on C6. Appearance is stable from previous. Vertebrae: Susceptibility artifact related to prior fusion at C6-7. Vertebral body height maintained without evidence for acute or chronic fracture. Bone marrow signal intensity mildly heterogeneous but within normal limits. No discrete or worrisome osseous lesions. No abnormal marrow edema. Cord: Signal intensity within the cervical spinal cord is normal. Normal cord caliber morphology. Posterior Fossa, vertebral arteries, paraspinal tissues: Probable minimal chronic microvascular ischemic change  noted within the pons. Visualized brain and posterior fossa otherwise unremarkable. Craniocervical junction within normal limits. Paraspinous and prevertebral soft tissues within normal limits. Normal intravascular flow voids seen within the vertebral arteries bilaterally. Disc levels: C2-C3: Unremarkable. C3-C4:  Unremarkable. C4-C5:  Unremarkable. C5-C6: Trace 2 mm retrolisthesis. Mild diffuse disc bulge with disc desiccation. Posterior annular fissure. Resultant mild spinal stenosis without cord deformity. Superimposed mild uncovertebral hypertrophy with resultant mild right C6 foraminal narrowing. No significant left foraminal encroachment. C6-C7: Prior fusion. No residual spinal stenosis. Residual right greater than left uncovertebral hypertrophy with persistent severe right and moderate left C7 foraminal narrowing. C7-T1:  Negative interspace. Mild facet hypertrophy. No stenosis. Visualized upper thoracic spine demonstrates no significant finding. IMPRESSION: 1. Prior fusion at C6-7 without residual spinal stenosis. Residual right greater than left uncovertebral hypertrophy with resultant severe right and moderate left C7 foraminal narrowing. 2. Adjacent segment disease with disc bulge and uncovertebral hypertrophy at C5-6, resulting in mild canal with mild right C6 foraminal stenosis. Electronically Signed   By: Marland Kitchen  Jeannine Boga M.D.   On: 01/20/2020 21:14   MR SHOULDER RIGHT WO CONTRAST  Result Date: 01/19/2020 CLINICAL DATA:  Chronic right shoulder pain. Limited range of motion. EXAM: MRI OF THE RIGHT SHOULDER WITHOUT CONTRAST TECHNIQUE: Multiplanar, multisequence MR imaging of the shoulder was performed. No intravenous contrast was administered. COMPARISON:  Radiographs dated 09/14/2018 FINDINGS: Rotator cuff:  The rotator cuff is intact and appears normal. Muscles: No atrophy or abnormal signal of the muscles of the rotator cuff. Biceps long head: Properly located and intact except for a tiny  intrasubstance tear in the tendon at the level of the bicipital groove seen on image 14 of series 3. Acromioclavicular Joint:  Normal.  Type 1 acromion. No bursitis Glenohumeral Joint: The patient has extensive full-thickness cartilage loss of the humeral head with slight subcortical edema deep to the articular surface with small marginal osteophytes on the inferior medial aspect of the humeral head. There is also diffuse thinning of the articular cartilage of the glenoid. Trace joint effusion. Labrum: Small focal tear of the posterosuperior aspect of the labrum at the 10 o'clock position seen on images 7 and 8 of series 4. Bones:  The extra-articular bones appear normal. Other: None IMPRESSION: 1. Mild to moderate osteoarthritis of the glenohumeral joint. 2. Small focal tear of the posterosuperior aspect of the glenoid labrum. 3. Intact rotator cuff. 4. Tiny intrasubstance tear of the long head of the biceps tendon at the level of the bicipital groove. Electronically Signed   By: Lorriane Shire M.D.   On: 01/19/2020 17:28     PMFS History: Current Outpatient Medications  Medication Sig Dispense Refill  . albuterol (VENTOLIN HFA) 108 (90 Base) MCG/ACT inhaler Inhale 2 puffs into the lungs every 4 (four) hours as needed for wheezing. 18 g 8  . atenolol (TENORMIN) 50 MG tablet Take 50 mg by mouth daily.  0  . carisoprodol (SOMA) 350 MG tablet Take 350 mg by mouth 3 (three) times daily as needed.    . cholestyramine (QUESTRAN) 4 g packet Take 4 g by mouth 4 (four) times daily.     . diazepam (VALIUM) 5 MG tablet Take 1 by mouth 1 hour  pre-procedure with very light food. May bring 2nd tablet to appointment. 2 tablet 0  . escitalopram (LEXAPRO) 20 MG tablet Take 20 mg by mouth daily.    . fluticasone (FLONASE) 50 MCG/ACT nasal spray Place 1 spray into both nostrils 2 (two) times daily.   2  . HYDROcodone-acetaminophen (NORCO) 10-325 MG tablet Take 1 tablet by mouth every 6 (six) hours as needed for pain.     . hydrOXYzine (ATARAX/VISTARIL) 25 MG tablet Take 25 mg by mouth 3 (three) times daily.    Marland Kitchen levothyroxine (SYNTHROID) 137 MCG tablet Take 125 mcg by mouth daily before breakfast.     . lisinopril (ZESTRIL) 20 MG tablet Take 20 mg by mouth daily.    . methocarbamol (ROBAXIN) 500 MG tablet Take 1 tablet (500 mg total) by mouth 4 (four) times daily as needed for muscle spasms. 120 tablet 3  . oxycodone (OXY-IR) 5 MG capsule Take 1 capsule (5 mg total) by mouth every 4 (four) hours as needed. 30 capsule 0  . predniSONE (DELTASONE) 50 MG tablet Take 1 tablet (50 mg total) by mouth daily with breakfast. 5 tablet 0  . promethazine-dextromethorphan (PROMETHAZINE-DM) 6.25-15 MG/5ML syrup Take 2.5-5 mLs by mouth 3 (three) times daily as needed.    Marland Kitchen Spacer/Aero-Holding Chambers (AEROCHAMBER MV) inhaler Use as  instructed 1 each 0  . SUMAtriptan (IMITREX) 100 MG tablet Take 100 mg by mouth every 2 (two) hours as needed for migraine.     . tiotropium (SPIRIVA HANDIHALER) 18 MCG inhalation capsule Place 1 capsule (18 mcg total) into inhaler and inhale daily. 30 capsule 2  . valACYclovir (VALTREX) 1000 MG tablet Take 1,000 mg by mouth as needed (for outbreak).      No current facility-administered medications for this visit.    Patient Active Problem List   Diagnosis Date Noted  . Labral tear of shoulder, right, initial encounter 02/12/2020  . Primary osteoarthritis, right shoulder 02/12/2020  . Pseudogout of left knee 11/06/2019  . Pseudogout of right knee 11/06/2019  . COPD with acute exacerbation ([place]) 09/25/2019  . AKI (acute kidney injury) ([place]) 09/25/2019  . Chronic back pain 09/25/2019  . Acute on chronic kidney failure ([place]) 09/25/2019  . Acute respiratory failure with hypoxemia ([place]) 09/25/2019  . Numbness 08/30/2019  . Confusion 08/30/2019  . Chondrocalcinosis due to dicalcium phosphate crystals, of the knee, right 04/12/2019  . Chronic pain of both shoulders 01/30/2019  . Bilateral  primary osteoarthritis of knee 01/08/2019  . Exposure to communicable disease 05/09/2018  . Flying phobia 05/09/2018  . H/O bee sting allergy 05/09/2018  . Mixed hyperlipidemia 05/09/2018  . Allergic contact dermatitis 04/14/2018  . Preop general physical exam 12/14/2017  . Mass of right hand 07/14/2017  . Cat scratch of forearm, right, initial encounter 06/30/2017  . Family history of breast cancer 06/22/2017  . History of ovarian cancer 06/22/2017  . Bipolar affective disorder in remission ([place]) 04/13/2017  . Allergic dermatitis due to poison ivy 03/10/2017  . Myofascial pain 03/10/2017  . Acute bronchitis due to Mycoplasma pneumoniae 02/17/2017  . Anemia 02/17/2017  . Cervical radiculopathy 02/17/2017  . Chronic pain syndrome 02/17/2017  . Complete tear of anterior cruciate ligament of knee 02/17/2017  . Dental infection 02/17/2017  . Epidermal cyst 02/17/2017  . Failed back surgical syndrome 02/17/2017  . Hypoglycemia 02/17/2017  . Lesion of nipple 02/17/2017  . DJD (degenerative joint disease) of knee 02/17/2017  . Petechiae 02/17/2017  . Impingement syndrome of right shoulder 02/17/2017  . Seizures ([place]) 02/17/2017  . Anxiety and depression 01/19/2017  . Cervical disc disease 01/19/2017  . Chronic bronchitis ([place]) 01/19/2017  . Contusion of right great toe without damage to nail 01/19/2017  . Migraine aura, persistent, intractable 01/19/2017  . Viral upper respiratory tract infection 01/19/2017  . Chronic diarrhea 01/14/2017  . Bipolar 2 disorder, major depressive episode ([place]) 08/26/2016  . Tobacco use disorder, severe, dependence 08/26/2016  . Benzodiazepine dependence ([place]) 08/25/2016  . Pain in right knee 07/02/2016  . Dry eye syndrome 06/17/2016  . Episode of recurrent major depressive disorder ([place]) 06/17/2016  . Essential hypertension 06/17/2016  . GERD without esophagitis 06/17/2016  . Hepatitis C 06/17/2016  . Herniated cervical disc 06/17/2016  . Herpes  simplex vulvovaginitis 06/17/2016  . Hypothyroidism (acquired) 06/17/2016  . Insomnia 06/17/2016  . Malaise and fatigue 06/17/2016  . Polycystic kidney disease 06/17/2016  . Primary osteoarthritis involving multiple joints 06/17/2016  . Allergic rhinitis 08/07/2014  . Anxiety state 08/07/2014  . Cough 08/07/2014  . Other amnesia 08/07/2014  . Other fatigue 08/07/2014  . Severe episode of recurrent major depressive disorder, with psychotic features ([place]) 08/07/2014  . Tobacco use 08/07/2014   Past Medical History:  Diagnosis Date  . Acid reflux   . Cancer ([place])    ovarian cancer  .  Cataract   . Chronic kidney disease    Stage 3  . Colitis   . Compound fracture    L1  . COPD (chronic obstructive pulmonary disease) ([place])   . Hepatitis C   . Hypertension   . Neck fracture ([place])   . Neuromuscular disorder ([facility])    neuropathy  . Numbness and tingling   . Thyroid disease   . Tremor     Family History  Problem Relation Age of Onset  . Other Mother        unsure of history  . Diabetes Father   . Hypertension Father   . Colon cancer Father   . Hypertension Sister   . Hypertension Brother     Past Surgical History:  Procedure Laterality Date  . CHOLECYSTECTOMY    . FOOT SURGERY    . IR VERTEBROPLASTY LUMBAR BX INC UNI/BIL INC/INJECT/IMAGING  08/21/2019  . KNEE ARTHROSCOPY    . NECK SURGERY    . SHOULDER ARTHROSCOPY    . VAGINAL HYSTERECTOMY     Social History   Occupational History  . Occupation: Disabled  Tobacco Use  . Smoking status: Current Every Day Smoker    Packs/day: 0.25    Years: 37.00    Pack years: 9.25    Types: Cigarettes  . Smokeless tobacco: Never Used  Substance and Sexual Activity  . Alcohol use: Yes    Comment: RARELY  . Drug use: Never  . Sexual activity: Not on file

## 2020-02-25 ENCOUNTER — Encounter: Payer: Medicare Other | Admitting: Physical Medicine and Rehabilitation

## 2020-02-27 ENCOUNTER — Telehealth: Payer: Self-pay | Admitting: Specialist

## 2020-02-27 NOTE — Telephone Encounter (Signed)
Discussed.

## 2020-02-27 NOTE — Telephone Encounter (Signed)
Patient called, requesting Dr. Louanne Skye refer her to [facility] advise

## 2020-02-27 NOTE — Telephone Encounter (Signed)
Okay to refer to [facility] Department of Neurosurgery for diagnosis of  Cervical spondylosis at level of previous fusion C6-7 with persistent severe foramenal narrowing, possible pseudarthrosis of previous fusion site C6-7,  Mild degenerative disc disease and spondylosis C5-6.  Phone Number for Clinic: [phone] Okay to refer to [facility] Department of Orthopaedics and Sports Medicine for diagnosis of  Moderately severe right shoulder degenerative joint disease with mild bicipital tendonitis. Clinic Number: [phone] Patient wished to have a referral, she wants to be referred and when this is done you may give out her phone number So she can be contacted. She is planning to return to [place], [place] when she is aware of the planned appointment to go to [facility] 2 hours from [place].

## 2020-02-27 NOTE — Telephone Encounter (Signed)
Patient called, requesting Dr. Louanne Skye refer her to [facility]. pts  callback [phone]

## 2020-02-28 ENCOUNTER — Other Ambulatory Visit: Payer: Self-pay | Admitting: Radiology

## 2020-02-28 DIAGNOSIS — M5126 Other intervertebral disc displacement, lumbar region: Secondary | ICD-10-CM

## 2020-02-28 DIAGNOSIS — M7521 Bicipital tendinitis, right shoulder: Secondary | ICD-10-CM

## 2020-02-28 DIAGNOSIS — M170 Bilateral primary osteoarthritis of knee: Secondary | ICD-10-CM

## 2020-02-28 DIAGNOSIS — M4722 Other spondylosis with radiculopathy, cervical region: Secondary | ICD-10-CM

## 2020-02-28 DIAGNOSIS — M5136 Other intervertebral disc degeneration, lumbar region: Secondary | ICD-10-CM

## 2020-02-28 NOTE — Telephone Encounter (Signed)
Both orders have been placed

## 2020-03-03 ENCOUNTER — Ambulatory Visit: Payer: Self-pay

## 2020-03-03 ENCOUNTER — Ambulatory Visit (INDEPENDENT_AMBULATORY_CARE_PROVIDER_SITE_OTHER): Payer: Medicare Other | Admitting: Physical Medicine and Rehabilitation

## 2020-03-03 ENCOUNTER — Other Ambulatory Visit: Payer: Self-pay

## 2020-03-03 ENCOUNTER — Encounter: Payer: Self-pay | Admitting: Physical Medicine and Rehabilitation

## 2020-03-03 VITALS — BP 150/94 | HR 77

## 2020-03-03 DIAGNOSIS — M5412 Radiculopathy, cervical region: Secondary | ICD-10-CM

## 2020-03-03 MED ORDER — DEXAMETHASONE SODIUM PHOSPHATE 10 MG/ML IJ SOLN
15.0000 mg | Freq: Once | INTRAMUSCULAR | Status: AC
  Administered 2020-03-03: 16:00:00 15 mg

## 2020-03-03 MED ORDER — BUPIVACAINE HCL 0.25 % IJ SOLN
2.0000 mL | Freq: Once | INTRAMUSCULAR | Status: AC
  Administered 2020-03-03: 2 mL

## 2020-03-03 NOTE — Progress Notes (Signed)
Pt states pain in the back of the neck and radiates into the right shoulder at times. Pt states pain started back 2010 after she broke her neck. Lifting holding head down makes pain worse. Heating pad and ice helps with pain.   .Numeric Pain Rating Scale and Functional Assessment Average Pain 9   In the last MONTH (on 0-10 scale) has pain interfered with the following?  1. General activity like being  able to carry out your everyday physical activities such as walking, climbing stairs, carrying groceries, or moving a chair?  Rating(9)   +Driver, -BT, -Dye Allergies.

## 2020-03-04 NOTE — Procedures (Signed)
Cervical Transforaminal Selective Nerve Root Block with Fluoroscopic Guidance   Patient: Raven Hansen      Date of Birth: Oct 03, 1961 MRN: [id number] PCP: [name], MD      Visit Date: 03/03/2020   Universal Protocol:    Date/Time: 05/11/215:54 AM  Consent Given By: the patient  Position: lateral  Additional Comments: Vital signs were monitored before and after the procedure. Patient was prepped and draped in the usual sterile fashion. The correct patient, procedure, and site was verified.   Injection Procedure Details:  Procedure Site One Meds Administered:  Meds ordered this encounter  Medications  . bupivacaine (MARCAINE) 0.25 % (with pres) injection 2 mL  . dexamethasone (DECADRON) injection 15 mg     Laterality: Right  Location/Site: C7 nerve root C6-7  Needle size: 25 duage  Needle type: 2-1/2 inch spinal  Needle Placement: foramen  Findings:  -Contrast Used: 0.5 mL iohexol 180 mg iodine/mL   -Comments: Excellent flow of contrast along the nerve and nerve root.  Procedure Details: The patient was positioned side-lying with the affected side upward.  The C arm was positioned with tilting to achieve a true lateral image and then positioned with more oblique rotation to achieve the most open image of the foramen possible.  The skin over the targeted area which was the posterior inferior portion of the foramen was then anesthetized with a small amount of 1% lidocaine without epinephrine.  A 25-gauge 1.5 in. needle was then introduced through the skin and down to the targeted area under biplanar fluoroscopic guidance.  Multiple AP and lateral/oblique views were monitored during needle manipulation.  Final placement was with the needle just outside of the 6 o'clock position on the AP view.  After negative aspirate for air, blood, and CSF, a 1 ml. volume of Isovue-250 was injected into the foramen and the flow of contrast was observed. Radiographs were obtained  for documentation purposes.   When there was no arterial or venous flow of contrast, the injectate was administered into the level noted above.     Additional Comments:  The patient tolerated the procedure well Dressing: Band-Aid    Post-procedure details: Patient was observed during the procedure. Post-procedure instructions were reviewed.  Patient left the clinic in stable condition.

## 2020-03-04 NOTE — Progress Notes (Signed)
Raven Hansen - 59 y.o. female MRN [id number]  Date of birth: [birth year]  Office Visit Note: Visit Date: 03/03/2020 PCP: [name], MD Referred by: [name], MD  Subjective: Chief Complaint  Patient presents with  . Neck - Pain  . Right Shoulder - Pain   HPI:  Raven Hansen is a 59 y.o. female who comes in today At the request of Dr. Jeneen Rinks neck for diagnostic selective nerve root block on the right at C7 which could be the C6-7 foramen.  She does have prior ACDF at this level with foraminal narrowing.  She is having right shoulder and arm pain.  She has extreme anxiety about the injection today.  She says that we gave her Valium for prior scheduled appointment that she did take the Valium and did not have any today.  And point of fact we never had her come in so she should have taken the Valium prior to that because we had her scheduled and rescheduled.  Also unfortunately we have tried to call her on several occasions to refill the prescription and could not get an answer.  She does suffer from some behavioral health diagnoses and anxiety.  She is being scheduled to be seen at the [facility] clinic for evaluation of her cervical spine.  At her request today we did use a small amount of dexamethasone and the selective nerve root block.  We did give her a pain diary that she can send to [place] or Dr. Louanne Skye in follow-up.  ROS Otherwise per HPI.  Assessment & Plan: Visit Diagnoses:  1. Cervical radiculopathy     Plan: No additional findings.   Meds & Orders:  Meds ordered this encounter  Medications  . bupivacaine (MARCAINE) 0.25 % (with pres) injection 2 mL  . dexamethasone (DECADRON) injection 15 mg    Orders Placed This Encounter  Procedures  . Nerve Block  . XR C-ARM NO REPORT    Follow-up: Return for Basil Dess, MD, Review Pain Diary.   Procedures: No procedures performed  Cervical Transforaminal Selective Nerve Root Block with Fluoroscopic Guidance    Patient: Raven Hansen      Date of Birth: 1961/07/29 MRN: [id number] PCP: [name], MD      Visit Date: 03/03/2020   Universal Protocol:    Date/Time: 05/11/215:54 AM  Consent Given By: the patient  Position: lateral  Additional Comments: Vital signs were monitored before and after the procedure. Patient was prepped and draped in the usual sterile fashion. The correct patient, procedure, and site was verified.   Injection Procedure Details:  Procedure Site One Meds Administered:  Meds ordered this encounter  Medications  . bupivacaine (MARCAINE) 0.25 % (with pres) injection 2 mL  . dexamethasone (DECADRON) injection 15 mg     Laterality: Right  Location/Site: C7 nerve root C6-7  Needle size: 25 duage  Needle type: 2-1/2 inch spinal  Needle Placement: foramen  Findings:  -Contrast Used: 0.5 mL iohexol 180 mg iodine/mL   -Comments: Excellent flow of contrast along the nerve and nerve root.  Procedure Details: The patient was positioned side-lying with the affected side upward.  The C arm was positioned with tilting to achieve a true lateral image and then positioned with more oblique rotation to achieve the most open image of the foramen possible.  The skin over the targeted area which was the posterior inferior portion of the foramen was then anesthetized with a small amount of 1% lidocaine without epinephrine.  A 25-gauge 1.5 in. needle was then introduced through the skin and down to the targeted area under biplanar fluoroscopic guidance.  Multiple AP and lateral/oblique views were monitored during needle manipulation.  Final placement was with the needle just outside of the 6 o'clock position on the AP view.  After negative aspirate for air, blood, and CSF, a 1 ml. volume of Isovue-250 was injected into the foramen and the flow of contrast was observed. Radiographs were obtained for documentation purposes.   When there was no arterial or venous flow of  contrast, the injectate was administered into the level noted above.     Additional Comments:  The patient tolerated the procedure well Dressing: Band-Aid    Post-procedure details: Patient was observed during the procedure. Post-procedure instructions were reviewed.  Patient left the clinic in stable condition.        Clinical History: No specialty comments available.     Objective:  VS:  HT:    WT:   BMI:     BP:(!) 150/94  HR:77bpm  TEMP: ( )  RESP:  Physical Exam Vitals and nursing note reviewed.  Constitutional:      General: She is not in acute distress.    Appearance: Normal appearance. She is not ill-appearing.  HENT:     Head: Normocephalic and atraumatic.     Right Ear: External ear normal.     Left Ear: External ear normal.  Eyes:     Extraocular Movements: Extraocular movements intact.  Cardiovascular:     Rate and Rhythm: Normal rate.     Pulses: Normal pulses.  Musculoskeletal:     Cervical back: Tenderness present. No rigidity.     Right lower leg: No edema.     Left lower leg: No edema.     Comments: Patient has good strength in the upper extremities including 5 out of 5 strength in wrist extension long finger flexion and APB.  There is no atrophy of the hands intrinsically.  There is a negative Hoffmann's test.   Lymphadenopathy:     Cervical: No cervical adenopathy.  Skin:    Findings: No erythema, lesion or rash.  Neurological:     General: No focal deficit present.     Mental Status: She is alert and oriented to person, place, and time.     Sensory: No sensory deficit.     Motor: No weakness or abnormal muscle tone.     Coordination: Coordination normal.  Psychiatric:     Comments: Extremely anxious and worried with somewhat pressured speech.      Imaging: XR C-ARM NO REPORT  Result Date: 03/03/2020 Please see Notes tab for imaging impression.

## 2020-03-06 ENCOUNTER — Telehealth: Payer: Self-pay | Admitting: Physical Medicine and Rehabilitation

## 2020-03-06 ENCOUNTER — Telehealth: Payer: Self-pay | Admitting: *Deleted

## 2020-03-06 NOTE — Telephone Encounter (Signed)
[address]483, Injection(s), anesthetic agent and/or more  Notification/Prior Authorization not required if procedure performed in Office; otherwise may be required for this service. 

## 2020-03-06 NOTE — Telephone Encounter (Signed)
Called pt and advised per Dr. Ernestina Patches and she states she will sign a release form during her appt 04/03/20 for [facility].

## 2020-03-06 NOTE — Telephone Encounter (Signed)
Ok, Dr. Louanne Skye req a Left L3 tf esi, can do valium or halcion let me know

## 2020-03-06 NOTE — Telephone Encounter (Signed)
Pt would like to have valium prior to coming in. Pharmacy updated. Please Advise.

## 2020-03-06 NOTE — Telephone Encounter (Signed)
No the injection was diagnostic, she can follow up with Dr. Louanne Skye vs the [facility].

## 2020-03-06 NOTE — Telephone Encounter (Signed)
1. Patient states that her cervical injection a few days ago worked. 2. Patient is requesting a lumbar ESI. Please advise.

## 2020-03-07 ENCOUNTER — Other Ambulatory Visit: Payer: Self-pay | Admitting: Physical Medicine and Rehabilitation

## 2020-03-07 ENCOUNTER — Telehealth: Payer: Self-pay | Admitting: Specialist

## 2020-03-07 DIAGNOSIS — F411 Generalized anxiety disorder: Secondary | ICD-10-CM

## 2020-03-07 MED ORDER — DIAZEPAM 5 MG PO TABS: ORAL_TABLET | ORAL | 0 refills | Status: DC

## 2020-03-07 NOTE — Telephone Encounter (Signed)
Called pt and advised.  

## 2020-03-07 NOTE — Telephone Encounter (Signed)
Done

## 2020-03-07 NOTE — Progress Notes (Signed)
Pre-procedure diazepam ordered for pre-operative anxiety.  

## 2020-03-07 NOTE — Telephone Encounter (Signed)
I called and spoke with Raven Hansen at the [place] clinic, she states that she doesn't see where a Raven Hansen has contacted anyone about needing additional information. She asked me what department she is in, I advised that I did not know that information.  Per Raven Hansen she could see a referral for pain management but not a referral for Orthopedics or Neurology.  I advised Raven Hansen that I would call the patient and let her know this.  I called and lmom for Raven Hansen that is she can get back in touch with Raven Hansen and have her call our office directly we could try to get her the information she is needing.  And I advised her of the information that Raven Hansen gave me.

## 2020-03-07 NOTE — Telephone Encounter (Signed)
Recieved call from Gail with the [place] clinic needing additional information from referral and also who is the referring doctor. The number to contact Meagan is [phone]

## 2020-03-10 ENCOUNTER — Telehealth: Payer: Self-pay

## 2020-03-10 ENCOUNTER — Telehealth: Payer: Self-pay | Admitting: Specialist

## 2020-03-10 ENCOUNTER — Telehealth: Payer: Self-pay | Admitting: Orthopaedic Surgery

## 2020-03-10 NOTE — Telephone Encounter (Signed)
Patient called asked when will the referral be sent to [facility] clinic.  Referral to [facility] is from Dr Louanne Skye. Patient also asked when can she get injections in both knees with Dr. Durward Fortes? The number to contact patient  is [phone]

## 2020-03-10 NOTE — Telephone Encounter (Signed)
Patient called concerning referral to [place] clinic. Advised patient that referral has not been entered.  Patient preferred to have phone number to [place] clinic, provided patient with number.

## 2020-03-10 NOTE — Telephone Encounter (Signed)
Patient had bilateral knee cortisone injections on 02/12/2020.

## 2020-03-10 NOTE — Telephone Encounter (Signed)
Orthopedics referral was sent by [facility] 03/03/20 Neuro referral was sent by [facility] 02/28/20  All orders by Louanne Skye has been sent to facility. Ortho clinic phone: [phone] Neuro clinic phone: [phone] These were the contact phone numbers that was noted in referrals, these numbers we called to receive fax numbers.  Please advise patient when she can have injections in both knees again. Thank you!

## 2020-03-10 NOTE — Telephone Encounter (Signed)
Bilateral knee injections done on 02/12/2020

## 2020-03-10 NOTE — Telephone Encounter (Signed)
Pt called in stating she wanted to see if she could get bilateral  Cortisone knee inj? Would like the ok to schedule.   [phone]

## 2020-03-10 NOTE — Telephone Encounter (Signed)
Called and spoke with patient. She had bilateral knee injections on 02/12/2020 and then had bilateral shoulder injections at [facility] on 02/21/2020 according to notes and dictations in [place]. Patient states that she only had her shoulders injected and forgot all about going to [facility]. I explained that I would have to speak with Aaron Edelman tomorrow and call her back.

## 2020-03-11 ENCOUNTER — Telehealth: Payer: Self-pay

## 2020-03-11 NOTE — Telephone Encounter (Signed)
Spoke with patient. Relayed information from [place]. She will buy it over the counter.

## 2020-03-11 NOTE — Telephone Encounter (Signed)
Spoke with patient. She is now fully aware that it was her knees that were injected on 02/12/2020. She is now requesting Ibuprofen 800mg  tablets be sent in to her pharmacy on file. She does not want to get it over the counter because it will cost her money.

## 2020-03-11 NOTE — Telephone Encounter (Signed)
Patient called in again to speak with me regarding her last visit. She truly feels that her shoulders were injected because her knees are already hurting. I went on to give her details about the office notes and what it said. I also told her that the cortisone injections may not help like they use to if her knees are continuing to get worse. I explained that we document everything and which body part and side before injecting. She states that she understands and seemed okay when we left the conversation.

## 2020-03-12 ENCOUNTER — Telehealth: Payer: Self-pay | Admitting: Radiology

## 2020-03-12 NOTE — Telephone Encounter (Signed)
Thanks [place], I have talked to them numerous times also, hopefully they will get her scheduled as soon as possible.

## 2020-03-12 NOTE — Telephone Encounter (Signed)
Received fax on 03/11/20 requesting department referred, why referred, referring physician, and patient insurance information.  Spoke with Rodman Pickle with the [facility].  Explained to [place] fax was sent on 02/28/20 with 20 pages in regards to referral type, departments, diagnosis, insurance, physician, along with related office notes.  Verbally confirmed with Rodman Pickle on Neurology and Orthopedic referrals, along with diagnosis verbiage. Confirmed physician is Basil Dess, MD. Confirmed only insurance card copy is of [facility] and gave member ID. He stated he was able to also see she has Medicaid [place].  Explained to [place] asked if I wanted to go ahead and set up appointments or have him call patient. Advised for him to call patient due to the understanding she travels between [place] and [place].  Rodman Pickle confirmed he would call patient.

## 2020-03-13 ENCOUNTER — Telehealth: Payer: Self-pay | Admitting: Specialist

## 2020-03-13 NOTE — Telephone Encounter (Signed)
Received vm from patient stating [facility] needs MRI/xray reports of spine and neck faxed to [phone], attn: Serena. Patient stated is scheduled with orthopedics but these additional records are needed before appt can be made with neuro. I faxed and on cover letter I asked Roanna Epley to please contact the patient when these additional records are received.

## 2020-03-14 ENCOUNTER — Telehealth: Payer: Self-pay | Admitting: Radiology

## 2020-03-14 NOTE — Telephone Encounter (Signed)
Received voicemail from patient stating someone from the [facility] still needed more information regarding imaging.  I have faxed cervical CT, cervical MRI, shoulder MRI, shoulder xray, the office visit that included the cervical, thoracic, and lumbar xrays.  I also attached the referrals again for orthopedics and neurology since this patient is having to go to different departments.   It also looks like Tammy H has also faxed requested information with Attn: Serena.  I did send my attachments to her as well.

## 2020-03-17 ENCOUNTER — Telehealth: Payer: Self-pay | Admitting: Specialist

## 2020-03-17 ENCOUNTER — Telehealth: Payer: Self-pay | Admitting: Orthopaedic Surgery

## 2020-03-17 ENCOUNTER — Other Ambulatory Visit: Payer: Self-pay

## 2020-03-17 NOTE — Telephone Encounter (Signed)
Tried to call patient. No answer. No voicemail. I was trying to get clarification to what she is wanting and why.

## 2020-03-17 NOTE — Telephone Encounter (Signed)
Hey, I just spoke with patient. She states that [facility] needs x-rays of her right shoulder and L-Spine. She states that the x-rays we performed are not recent enough. She would like to have these done at [facility] because it is closer to her. I explained that Dr.Nitka placed the referral to the [facility] so I will forward this information to his assistant. If Dr.Nitka approves she wants someone to let her know so that she can get the x-rays done. Thanks!

## 2020-03-17 NOTE — Telephone Encounter (Signed)
Can you please check the status of her referrals to [facility] are saying that they have not rec'd any records from [place]. I am getting Raven Hansen to make a cd of her xrays and MRI's to send to [facility]-

## 2020-03-17 NOTE — Telephone Encounter (Signed)
Pt called stating she needs an X ray for her shoulder but doesn't want it done here; pt would like a call back.  [phone]

## 2020-03-17 NOTE — Telephone Encounter (Signed)
Can you make a CD with any xrays and MRI's that she had in [place]? She states that she is coming in tomorrow to sign a release for the xrays and her notes to go to [facility] have put a referral in to them but they are saying they have not rec'd the records.

## 2020-03-17 NOTE — Telephone Encounter (Signed)
Patient called stating [facility] is requesting a xray be done on right shoulder and back at [facility] on [address] in [place]. Patient phone number is [phone].

## 2020-03-18 ENCOUNTER — Telehealth: Payer: Self-pay | Admitting: Specialist

## 2020-03-18 ENCOUNTER — Telehealth: Payer: Self-pay | Admitting: Orthopaedic Surgery

## 2020-03-18 NOTE — Telephone Encounter (Signed)
Called patient. She wants Dr.Whitfield to order an MRI of her knee to be done at [facility] so that she can take those results with her to [facility]. I spoke with Dr.Whitfield and he is not ordering an MRI of her knee. In her last visit with [facility] on 03/13/2020 it was dictated that they reviewed her x-rays and MRI. I told patient that she needs to obtain a disc and reports from them for her knees, because we do not have those.

## 2020-03-18 NOTE — Telephone Encounter (Signed)
CD was made with all x-rays from 2019-present. Placed on your desk.

## 2020-03-18 NOTE — Telephone Encounter (Signed)
Spoke with patient she asked if Dr Durward Fortes will send her to [place] for MRI at [facility]. Patient said she is leaving on 04/07/2020 for [facility] and need to take the MRI report  and CD with her as well. The number to contact patient is [phone]

## 2020-03-18 NOTE — Telephone Encounter (Signed)
Patient is calling requesting xray CD's mailed to her home. Our office is making referral to [facility], she has appt 6/16. Please copy all imaging that's available from our providers to CD. Let me know when ready, I'll pick up. Thanks!

## 2020-03-18 NOTE — Telephone Encounter (Signed)
Thank you for refaxing.  I have faxed multiple times and it is noted where Tammy H. Has also faxed information.  I have faxed on dates to numbers provided to me:  03/14/20 [phone] with 20+ pages 02/28/20  [phone] with 20+ pages

## 2020-03-18 NOTE — Telephone Encounter (Signed)
CD & all xray reports (that I faxed to [facility] on 5/20)mailed to patient. Patient advised this is done and should receive by early next week.

## 2020-03-18 NOTE — Telephone Encounter (Signed)
Referral has been refaxed to [facility]

## 2020-03-25 ENCOUNTER — Telehealth: Payer: Self-pay | Admitting: Orthopaedic Surgery

## 2020-03-25 NOTE — Telephone Encounter (Signed)
Patient called asked if she should keep her appointment 03/27/2020? The number to contact patient is [phone]

## 2020-03-25 NOTE — Telephone Encounter (Signed)
Spoke with patient. She is requesting MRI of her left knee, as well as new x-rays of her knee and shoulder. I explained that Dr.Whitfield has not changed his mind since last week, and still stands firm that he is not ordering an MRI of her knee. She is going to the [facility] at Dr.Nitka's request. I then further explained that she is seeking treatment from [place] and also [facility]. In their last dictation last month they dictated that they reviewed her x-rays and MRI. I ask that she get the report and disc from them since they obviously have something more recent to review than [place].

## 2020-03-27 ENCOUNTER — Ambulatory Visit: Payer: Medicare Other | Admitting: Orthopaedic Surgery

## 2020-03-31 ENCOUNTER — Telehealth: Payer: Self-pay

## 2020-03-31 ENCOUNTER — Ambulatory Visit (INDEPENDENT_AMBULATORY_CARE_PROVIDER_SITE_OTHER): Payer: Medicare Other | Admitting: Family

## 2020-03-31 ENCOUNTER — Other Ambulatory Visit: Payer: Self-pay

## 2020-03-31 ENCOUNTER — Encounter: Payer: Self-pay | Admitting: Family

## 2020-03-31 VITALS — BP 138/88 | HR 76 | Temp 98.7°F | Resp 16 | Ht 64.0 in | Wt 171.0 lb

## 2020-03-31 DIAGNOSIS — G894 Chronic pain syndrome: Secondary | ICD-10-CM

## 2020-03-31 DIAGNOSIS — I10 Essential (primary) hypertension: Secondary | ICD-10-CM

## 2020-03-31 DIAGNOSIS — F419 Anxiety disorder, unspecified: Secondary | ICD-10-CM

## 2020-03-31 DIAGNOSIS — B370 Candidal stomatitis: Secondary | ICD-10-CM

## 2020-03-31 DIAGNOSIS — G43909 Migraine, unspecified, not intractable, without status migrainosus: Secondary | ICD-10-CM | POA: Diagnosis not present

## 2020-03-31 DIAGNOSIS — N760 Acute vaginitis: Secondary | ICD-10-CM

## 2020-03-31 DIAGNOSIS — Z8543 Personal history of malignant neoplasm of ovary: Secondary | ICD-10-CM

## 2020-03-31 DIAGNOSIS — Q613 Polycystic kidney, unspecified: Secondary | ICD-10-CM | POA: Diagnosis not present

## 2020-03-31 DIAGNOSIS — A6000 Herpesviral infection of urogenital system, unspecified: Secondary | ICD-10-CM

## 2020-03-31 DIAGNOSIS — G6289 Other specified polyneuropathies: Secondary | ICD-10-CM

## 2020-03-31 DIAGNOSIS — G8929 Other chronic pain: Secondary | ICD-10-CM

## 2020-03-31 LAB — BASIC METABOLIC PANEL
BUN: 32 mg/dL — ABNORMAL HIGH (ref 6–23)
CO2: 29 mEq/L (ref 19–32)
Calcium: 9.4 mg/dL (ref 8.4–10.5)
Chloride: 102 mEq/L (ref 96–112)
Creatinine, Ser: 1.12 mg/dL (ref 0.40–1.20)
GFR: 50.15 mL/min — ABNORMAL LOW (ref >60.00)
Glucose, Bld: 85 mg/dL (ref 70–99)
Potassium: 4.6 mEq/L (ref 3.5–5.1)
Sodium: 135 mEq/L (ref 135–145)

## 2020-03-31 MED ORDER — VALACYCLOVIR HCL 1 G PO TABS: 1000.0000 mg | ORAL_TABLET | ORAL | 1 refills | Status: AC | PRN

## 2020-03-31 MED ORDER — ATENOLOL 100 MG PO TABS
100.0000 mg | ORAL_TABLET | Freq: Every day | ORAL | 1 refills | Status: AC
Start: 2020-03-31 — End: ?

## 2020-03-31 MED ORDER — LEVOTHYROXINE SODIUM 125 MCG PO TABS
125.0000 ug | ORAL_TABLET | Freq: Every day | ORAL | 1 refills | Status: AC
Start: 2020-03-31 — End: ?

## 2020-03-31 MED ORDER — SUMATRIPTAN SUCCINATE 100 MG PO TABS: 100.0000 mg | ORAL_TABLET | ORAL | 1 refills | Status: AC | PRN

## 2020-03-31 MED ORDER — ESCITALOPRAM OXALATE 20 MG PO TABS: 20.0000 mg | ORAL_TABLET | Freq: Every day | ORAL | 1 refills | Status: AC

## 2020-03-31 MED ORDER — HYDROCODONE-ACETAMINOPHEN 10-325 MG PO TABS: 1.0000 | ORAL_TABLET | Freq: Four times a day (QID) | ORAL | 0 refills | Status: AC | PRN

## 2020-03-31 MED ORDER — FLUCONAZOLE 150 MG PO TABS: ORAL_TABLET | ORAL | 0 refills | Status: AC

## 2020-03-31 MED ORDER — METHOCARBAMOL 500 MG PO TABS: 500.0000 mg | ORAL_TABLET | Freq: Four times a day (QID) | ORAL | 5 refills | Status: AC | PRN

## 2020-03-31 MED ORDER — NYSTATIN 100000 UNIT/ML MT SUSP
5.0000 mL | Freq: Four times a day (QID) | OROMUCOSAL | 1 refills | Status: AC
Start: 2020-03-31 — End: ?

## 2020-03-31 MED ORDER — AMLODIPINE BESYLATE 10 MG PO TABS
10.0000 mg | ORAL_TABLET | Freq: Every day | ORAL | 1 refills | Status: AC
Start: 2020-03-31 — End: ?

## 2020-03-31 NOTE — Telephone Encounter (Signed)
Patient called in to get a prescription refill for  HYDROcodone-acetaminophen (NORCO) 10-325 MG tablet [[id number]]    carisoprodol (SOMA) 350 MG tablet [[id number]]    Please send it to [place]  [place], [place] [address]  Phone:  [phone] Fax:  [phone]  DEA #:  [id number]

## 2020-03-31 NOTE — Progress Notes (Signed)
Subjective:    Patient ID: Raven Hansen, female    DOB: 12/21/1960, 59 y.o.   MRN: [id number]  HPI   Patient is a 59 yr old female who presents today to establish care.   Pmhx is significant for the following:  Hypothyroid disease- maintained on synthroid.  Reports she has been on synthroid for about 20 years  Ovarian cancer- reports that this was at age 39 with total hysterectomy. Reports that this was curative. Reports that she was on HRT following her hystectomy.   Peripheral neuropathy-chronic- reports that her right foot and right hand are most affected.  COPD- She uses albuterol prn.  Reprots that she keeps getting thrush in her throat.  Uses spiriva. Reports that she also has vaginal itching consistent with previous yeast infection.  Polycystic kidney disease- last cr was 1.72- reports that she saw a kidney doctor.  Has an appointment with [facility].    GERD- takes famotidine and prilosec.    Ulcerative Colitis/IBS-reports daily diarrhea, takes lomotil prn.  Uses every day.   Hep C- did 5 yrs of alpha interferon, then harvoni 2015- reports that her liver enzymes are normal.    Tobacco abuse-  1 PPD- intolerant to the patch, chantix, wellbutrin.   HTN- maintained on atenolol 100mg , amlodipine 10mg , hyalazine 25mg  once daily.  .  BP Readings from Last 3 Encounters:  03/31/20 138/88  03/03/20 (!) 150/94  02/04/20 (!) 157/84   Anxiety- maintained on lexapro 20mg .  Reports that she has been stable on this dose.   Migraines- Reports that she has been having migraines daily recently but has had a lot of stress. Uses sumatriptan prn.   Genital herpes- uses valtrex prn.      Review of Systems See HPI  Past Medical History:  Diagnosis Date  . Acid reflux   . Cataract   . Chronic kidney disease    Stage 3  . Colitis   . Compound fracture    L1  . COPD (chronic obstructive pulmonary disease) ([place])   . Hepatitis C   . Hypertension   . Neck fracture ([place])    . Neuromuscular disorder ([facility])    neuropathy  . Numbness and tingling   . Ovarian cancer ([place])   . Thyroid disease   . Tremor      Social History   Socioeconomic History  . Marital status: Single    Spouse name: Not on file  . Number of children: 0  . Years of education: 36  . Highest education level: Not on file  Occupational History  . Occupation: Disabled  Tobacco Use  . Smoking status: Current Every Day Smoker    Packs/day: 0.25    Years: 37.00    Pack years: 9.25    Types: Cigarettes  . Smokeless tobacco: Never Used  Substance and Sexual Activity  . Alcohol use: Yes    Comment: RARELY  . Drug use: Never  . Sexual activity: Not Currently  Other Topics Concern  . Not on file  Social History Narrative   Lives at home alone.   Right-handed.   Two cups caffeine use.   Social Determinants of Health   Financial Resource Strain: Low Risk   . Difficulty of Paying Living Expenses: Not hard at all  Food Insecurity:   . Worried About Charity fundraiser in the Last Year:   . Arboriculturist in the Last Year:   Transportation Needs:   . Lack of Transportation (  Medical):   Marland Kitchen Lack of Transportation (Non-Medical):   Physical Activity: Inactive  . Days of Exercise per Week: 0 days  . Minutes of Exercise per Session: 0 min  Stress: Stress Concern Present  . Feeling of Stress : Very much  Social Connections:   . Frequency of Communication with Friends and Family:   . Frequency of Social Gatherings with Friends and Family:   . Attends Religious Services:   . Active Member of Clubs or Organizations:   . Attends Archivist Meetings:   Marland Kitchen Marital Status:   Intimate Partner Violence:   . Fear of Current or Ex-Partner:   . Emotionally Abused:   Marland Kitchen Physically Abused:   . Sexually Abused:     Past Surgical History:  Procedure Laterality Date  . CHOLECYSTECTOMY    . FOOT SURGERY    . IR VERTEBROPLASTY LUMBAR BX INC UNI/BIL INC/INJECT/IMAGING  08/21/2019  .  KNEE ARTHROSCOPY    . NECK SURGERY    . SHOULDER ARTHROSCOPY    . VAGINAL HYSTERECTOMY      Family History  Problem Relation Age of Onset  . Other Mother        unsure of history  . Diabetes Father   . Hypertension Father   . Hypertension Sister   . Hypertension Brother     Allergies  Allergen Reactions  . Bee Venom Swelling  . Meperidine Hcl Other (See Comments)    Headaches.  . Meperidine Other (See Comments)    headaches    Current Outpatient Medications on File Prior to Visit  Medication Sig Dispense Refill  . albuterol (VENTOLIN HFA) 108 (90 Base) MCG/ACT inhaler Inhale 2 puffs into the lungs every 4 (four) hours as needed for wheezing. 18 g 8  . atenolol (TENORMIN) 50 MG tablet Take 50 mg by mouth daily.  0  . carisoprodol (SOMA) 350 MG tablet Take 350 mg by mouth 3 (three) times daily as needed.    . cholestyramine (QUESTRAN) 4 g packet Take 4 g by mouth 4 (four) times daily.     Marland Kitchen escitalopram (LEXAPRO) 20 MG tablet Take 20 mg by mouth daily.    . fluticasone (FLONASE) 50 MCG/ACT nasal spray Place 1 spray into both nostrils 2 (two) times daily.   2  . hydrOXYzine (ATARAX/VISTARIL) 25 MG tablet Take 25 mg by mouth 3 (three) times daily.    Marland Kitchen levothyroxine (SYNTHROID) 137 MCG tablet Take 125 mcg by mouth daily before breakfast.     . methocarbamol (ROBAXIN) 500 MG tablet Take 1 tablet (500 mg total) by mouth 4 (four) times daily as needed for muscle spasms. 120 tablet 3  . oxycodone (OXY-IR) 5 MG capsule Take 1 capsule (5 mg total) by mouth every 4 (four) hours as needed. 30 capsule 0  . predniSONE (DELTASONE) 50 MG tablet Take 1 tablet (50 mg total) by mouth daily with breakfast. 5 tablet 0  . Spacer/Aero-Holding Chambers (AEROCHAMBER MV) inhaler Use as instructed 1 each 0  . SUMAtriptan (IMITREX) 100 MG tablet Take 100 mg by mouth every 2 (two) hours as needed for migraine.     . tiotropium (SPIRIVA HANDIHALER) 18 MCG inhalation capsule Place 1 capsule (18 mcg total)  into inhaler and inhale daily. 30 capsule 2  . valACYclovir (VALTREX) 1000 MG tablet Take 1,000 mg by mouth as needed (for outbreak).      No current facility-administered medications on file prior to visit.    BP 138/88 (BP Location: Right Arm,  Patient Position: Sitting, Cuff Size: Small)   Pulse 76   Temp 98.7 F (37.1 C) (Temporal)   Resp 16   Ht 5\' 4"  (1.626 m)   Wt 171 lb (77.6 kg)   SpO2 98%   BMI 29.35 kg/m       Objective:   Physical Exam Constitutional:      Appearance: She is well-developed.  HENT:     Mouth/Throat:     Comments: Thrush noted on tongue Cardiovascular:     Rate and Rhythm: Normal rate and regular rhythm.     Heart sounds: Normal heart sounds. No murmur heard.   Pulmonary:     Effort: Pulmonary effort is normal. No respiratory distress.     Breath sounds: Normal breath sounds. No wheezing.  Psychiatric:        Behavior: Behavior normal.        Thought Content: Thought content normal.        Judgment: Judgment normal.           Assessment & Plan:  Hypothyroid- reports TSH normal 1 month ago. Continue current dose of synthroid.  Hx of ovarian cancer- (remote).  S/p TAH/BSO.  Peripheral neuropathy- numbness associated with her cervical and lumbar disease.  Chronic pain syndrome- Followed by pain management. She is requesting a referral to a pain clinic in [place] due to her need to travel out of state to help her dad with his estate. I advised pt that I do not do pain management but am happy to place referral.  Genital herpes- stable with prn use of valtrex.  Oral Thrush- rx with nystatin mouth wash  HTN- bp stable on current medications. Continue same.  Vaginitis- new. rx with diflucan.  Migraines- more frequent due to stress. If symptoms persist consider referral to neurology.  Anxiety- stable on lexapro. Continue same.   Polycystic kidney disease- has upcoming appointment with nephrology. Check bmet.   60 minutes spent on today's  visit. The majority of the time was spent reviewing hx, counseling pt and on chart review.  This visit occurred during the SARS-CoV-2 public health emergency.  Safety protocols were in place, including screening questions prior to the visit, additional usage of staff PPE, and extensive cleaning of exam room while observing appropriate contact time as indicated for disinfecting solutions.

## 2020-03-31 NOTE — Telephone Encounter (Signed)
Patient called in to give NP Raven Hansen an update to the referral that she is going to put in for hr  to  [facility]   Dr. Tereasa Coop     Address: [address] [place]   Fax number [phone]   Phone number is [phone]

## 2020-03-31 NOTE — Patient Instructions (Signed)
Please complete lab work prior to leaving.   

## 2020-04-02 NOTE — Telephone Encounter (Signed)
Lvm for patient to call about this medication refill request

## 2020-04-02 NOTE — Progress Notes (Signed)
Mailed out to pt 

## 2020-04-02 NOTE — Telephone Encounter (Signed)
Please advise her that these refills need to come from pain management. She should continue to get her refills from [facility] until she is able to establish with the new pain management in [place].  I have placed the new referral.

## 2020-04-03 ENCOUNTER — Ambulatory Visit: Payer: Self-pay

## 2020-04-03 ENCOUNTER — Other Ambulatory Visit: Payer: Self-pay

## 2020-04-03 ENCOUNTER — Ambulatory Visit (INDEPENDENT_AMBULATORY_CARE_PROVIDER_SITE_OTHER): Payer: Medicare Other | Admitting: Physical Medicine and Rehabilitation

## 2020-04-03 VITALS — BP 137/95 | HR 76

## 2020-04-03 DIAGNOSIS — A6000 Herpesviral infection of urogenital system, unspecified: Secondary | ICD-10-CM | POA: Insufficient documentation

## 2020-04-03 DIAGNOSIS — M5416 Radiculopathy, lumbar region: Secondary | ICD-10-CM

## 2020-04-03 DIAGNOSIS — G629 Polyneuropathy, unspecified: Secondary | ICD-10-CM | POA: Insufficient documentation

## 2020-04-03 DIAGNOSIS — G43909 Migraine, unspecified, not intractable, without status migrainosus: Secondary | ICD-10-CM | POA: Insufficient documentation

## 2020-04-03 MED ORDER — DEXAMETHASONE SODIUM PHOSPHATE 10 MG/ML IJ SOLN
15.0000 mg | Freq: Once | INTRAMUSCULAR | Status: AC
Start: 2020-04-03 — End: 2020-04-03
  Administered 2020-04-03: 15 mg

## 2020-04-03 NOTE — Procedures (Signed)
Lumbosacral Transforaminal Epidural Steroid Injection - Sub-Pedicular Approach with Fluoroscopic Guidance  Patient: Raven Hansen      Date of Birth: 1961/08/01 MRN: [id number] PCP: Debbrah Alar, NP      Visit Date: 04/03/2020   Universal Protocol:    Date/Time: 04/03/2020  Consent Given By: the patient  Position: PRONE  Additional Comments: Vital signs were monitored before and after the procedure. Patient was prepped and draped in the usual sterile fashion. The correct patient, procedure, and site was verified.   Injection Procedure Details:  Procedure Site One Meds Administered:  Meds ordered this encounter  Medications  . dexamethasone (DECADRON) injection 15 mg    Laterality: Bilateral  Location/Site:  L3-L4  Needle size: 22 G  Needle type: Spinal  Needle Placement: Transforaminal  Findings:    -Comments: Excellent flow of contrast along the nerve and into the epidural space.  Procedure Details: After squaring off the end-plates to get a true AP view, the C-arm was positioned so that an oblique view of the foramen as noted above was visualized. The target area is just inferior to the "nose of the scotty dog" or sub pedicular. The soft tissues overlying this structure were infiltrated with 2-3 ml. of 1% Lidocaine without Epinephrine.  The spinal needle was inserted toward the target using a "trajectory" view along the fluoroscope beam.  Under AP and lateral visualization, the needle was advanced so it did not puncture dura and was located close the 6 O'Clock position of the pedical in AP tracterory. Biplanar projections were used to confirm position. Aspiration was confirmed to be negative for CSF and/or blood. A 1-2 ml. volume of Isovue-250 was injected and flow of contrast was noted at each level. Radiographs were obtained for documentation purposes.   After attaining the desired flow of contrast documented above, a 0.5 to 1.0 ml test dose of 0.25%  Marcaine was injected into each respective transforaminal space.  The patient was observed for 90 seconds post injection.  After no sensory deficits were reported, and normal lower extremity motor function was noted,   the above injectate was administered so that equal amounts of the injectate were placed at each foramen (level) into the transforaminal epidural space.   Additional Comments:  The patient tolerated the procedure well Dressing: 2 x 2 sterile gauze and Band-Aid    Post-procedure details: Patient was observed during the procedure. Post-procedure instructions were reviewed.  Patient left the clinic in stable condition.

## 2020-04-03 NOTE — Progress Notes (Signed)
Pt states pain in the middle of the lower back. Pt states pain started 11/22/2018. Bending and standing for a long time makes pain worse. Heating pad and laying flat helps with pain.   .Numeric Pain Rating Scale and Functional Assessment Average Pain 9   In the last MONTH (on 0-10 scale) has pain interfered with the following?  1. General activity like being  able to carry out your everyday physical activities such as walking, climbing stairs, carrying groceries, or moving a chair?  Rating(9)   +Driver, -BT, -Dye Allergies.

## 2020-04-03 NOTE — Progress Notes (Signed)
Raven Hansen - 59 y.o. female MRN [id number]  Date of birth: [birth year]  Office Visit Note: Visit Date: 04/03/2020 PCP: Debbrah Alar, NP Referred by: [name], MD  Subjective: Chief Complaint  Patient presents with  . Lower Back - Pain   HPI: Raven Hansen is a 59 y.o. female who comes in today At the request of Dr. Basil Dess for planned diagnostic and therapeutic left L3 transforaminal epidural steroid injection.  Patient with midline low back pain around the L3 area.  She has had prior compression fracture at L1 status post kyphoplasty.  She asked today if we can inject both sides as it of the left side.  She is scheduled to see consultation with the [facility] clinic as well as pain management there.  She wants to have all of her notes from our office sent to their office.  I tried to explain to her that that could be done with the [facility] clinic requesting all information where she can talk at the front desk to get all of her notes sent to them.  As far as I know the notes are complete as soon as this note is finished today.  Went over the risk and benefits today of the particular injection.  She has fairly poor insight into how injections work into how her spine pain is related to prior injuries etc.  ROS Otherwise per HPI.  Assessment & Plan: Visit Diagnoses:  1. Lumbar radiculopathy     Plan: No additional findings.   Meds & Orders:  Meds ordered this encounter  Medications  . dexamethasone (DECADRON) injection 15 mg    Orders Placed This Encounter  Procedures  . XR C-ARM NO REPORT  . Epidural Steroid injection    Follow-up: Return for visit to requesting physician as needed.   Procedures: No procedures performed  Lumbosacral Transforaminal Epidural Steroid Injection - Sub-Pedicular Approach with Fluoroscopic Guidance  Patient: Raven Hansen      Date of Birth: 1961/07/10 MRN: [id number] PCP: Debbrah Alar, NP      Visit Date:  04/03/2020   Universal Protocol:    Date/Time: 04/03/2020  Consent Given By: the patient  Position: PRONE  Additional Comments: Vital signs were monitored before and after the procedure. Patient was prepped and draped in the usual sterile fashion. The correct patient, procedure, and site was verified.   Injection Procedure Details:  Procedure Site One Meds Administered:  Meds ordered this encounter  Medications  . dexamethasone (DECADRON) injection 15 mg    Laterality: Bilateral  Location/Site:  L3-L4  Needle size: 22 G  Needle type: Spinal  Needle Placement: Transforaminal  Findings:    -Comments: Excellent flow of contrast along the nerve and into the epidural space.  Procedure Details: After squaring off the end-plates to get a true AP view, the C-arm was positioned so that an oblique view of the foramen as noted above was visualized. The target area is just inferior to the "nose of the scotty dog" or sub pedicular. The soft tissues overlying this structure were infiltrated with 2-3 ml. of 1% Lidocaine without Epinephrine.  The spinal needle was inserted toward the target using a "trajectory" view along the fluoroscope beam.  Under AP and lateral visualization, the needle was advanced so it did not puncture dura and was located close the 6 O'Clock position of the pedical in AP tracterory. Biplanar projections were used to confirm position. Aspiration was confirmed to be negative for CSF and/or blood. A 1-2 ml. volume  of Isovue-250 was injected and flow of contrast was noted at each level. Radiographs were obtained for documentation purposes.   After attaining the desired flow of contrast documented above, a 0.5 to 1.0 ml test dose of 0.25% Marcaine was injected into each respective transforaminal space.  The patient was observed for 90 seconds post injection.  After no sensory deficits were reported, and normal lower extremity motor function was noted,   the above  injectate was administered so that equal amounts of the injectate were placed at each foramen (level) into the transforaminal epidural space.   Additional Comments:  The patient tolerated the procedure well Dressing: 2 x 2 sterile gauze and Band-Aid    Post-procedure details: Patient was observed during the procedure. Post-procedure instructions were reviewed.  Patient left the clinic in stable condition.     Clinical History: CLINICAL DATA:  L1 fracture. Continued surveillance.  EXAM: MRI LUMBAR SPINE WITHOUT CONTRAST  TECHNIQUE: Multiplanar, multisequence MR imaging of the lumbar spine was performed. No intravenous contrast was administered.  COMPARISON:  MRI lumbar 05/21/2019. CT lumbar spine 11/22/2018.  FINDINGS: Segmentation:  Standard.  Alignment:  Physiologic.  Vertebrae: Unchanged L1 compression fracture, superior endplate depression primarily. Approximate 40% loss of vertebral body height. No significant further loss of height compared with the July 2020 exam. No significant retropulsion. Only faint STIR hyperintensity, if any.  Conus medullaris and cauda equina: Conus extends to the L1-L2 level. Conus and cauda equina appear normal.  Paraspinal and other soft tissues: Renal cystic disease, incompletely evaluated. No paravertebral hematoma.  Disc levels:  L1-L2:  Disc space narrowing. Annular bulge. No impingement.  L2-L3:  Annular bulge. Facet arthropathy. No impingement.  L3-L4: Annular bulge. Facet arthropathy. Foraminal and extraforaminal protrusion to the LEFT with spurring. LEFT L3 neural impingement possible.  L4-L5:  Unremarkable disc space.  L5-S1:  Unremarkable disc space.  IMPRESSION: 1. Unchanged ~40% L1 compression fracture, superior endplate depression, without significant retropulsion. Despite the lack of significant residual bone marrow edema on today's study, vertebral augmentation may be indicated to improve  both L1 vertebral height and developing kyphosis. 2. Spurring and foraminal/extraforaminal protrusion at L3-4 to the LEFT; L3 neural impingement possible.   Electronically Signed   By: Staci Righter M.D.   On: 08/05/2019 21:13   She reports that she has been smoking cigarettes. She has a 9.25 pack-year smoking history. She has never used smokeless tobacco.  Recent Labs    08/30/19 1517  HGBA1C 5.4    Objective:  VS:  HT:    WT:   BMI:     BP:(!) 137/95  HR:76bpm  TEMP: ( )  RESP:  Physical Exam Constitutional:      General: She is not in acute distress.    Appearance: Normal appearance. She is not ill-appearing.  HENT:     Head: Normocephalic and atraumatic.     Right Ear: External ear normal.     Left Ear: External ear normal.  Eyes:     Extraocular Movements: Extraocular movements intact.  Cardiovascular:     Rate and Rhythm: Normal rate.     Pulses: Normal pulses.  Musculoskeletal:     Right lower leg: No edema.     Left lower leg: No edema.     Comments: Patient has good distal strength with no pain over the greater trochanters.  No clonus or focal weakness.  Skin:    Findings: No erythema, lesion or rash.     Comments: Large bruise over the  left flank  Neurological:     General: No focal deficit present.     Mental Status: She is alert and oriented to person, place, and time.     Sensory: No sensory deficit.     Motor: No weakness or abnormal muscle tone.     Coordination: Coordination normal.     Gait: Gait normal.  Psychiatric:        Mood and Affect: Mood normal.        Behavior: Behavior normal.     Ortho Exam  Imaging: XR C-ARM NO REPORT  Result Date: 04/03/2020 Please see Notes tab for imaging impression.   Past Medical/Family/Surgical/Social History: Medications & Allergies reviewed per EMR, new medications updated. Patient Active Problem List   Diagnosis Date Noted  . Labral tear of shoulder, right, initial encounter 02/12/2020  .  Primary osteoarthritis, right shoulder 02/12/2020  . Pseudogout of left knee 11/06/2019  . Pseudogout of right knee 11/06/2019  . COPD with acute exacerbation ([place]) 09/25/2019  . AKI (acute kidney injury) ([place]) 09/25/2019  . Chronic back pain 09/25/2019  . Acute on chronic kidney failure ([place]) 09/25/2019  . Acute respiratory failure with hypoxemia ([place]) 09/25/2019  . Numbness 08/30/2019  . Confusion 08/30/2019  . Chondrocalcinosis due to dicalcium phosphate crystals, of the knee, right 04/12/2019  . Chronic pain of both shoulders 01/30/2019  . Bilateral primary osteoarthritis of knee 01/08/2019  . Exposure to communicable disease 05/09/2018  . Flying phobia 05/09/2018  . H/O bee sting allergy 05/09/2018  . Mixed hyperlipidemia 05/09/2018  . Allergic contact dermatitis 04/14/2018  . Preop general physical exam 12/14/2017  . Mass of right hand 07/14/2017  . Cat scratch of forearm, right, initial encounter 06/30/2017  . Family history of breast cancer 06/22/2017  . History of ovarian cancer 06/22/2017  . Bipolar affective disorder in remission ([place]) 04/13/2017  . Allergic dermatitis due to poison ivy 03/10/2017  . Myofascial pain 03/10/2017  . Acute bronchitis due to Mycoplasma pneumoniae 02/17/2017  . Anemia 02/17/2017  . Cervical radiculopathy 02/17/2017  . Chronic pain syndrome 02/17/2017  . Complete tear of anterior cruciate ligament of knee 02/17/2017  . Dental infection 02/17/2017  . Epidermal cyst 02/17/2017  . Failed back surgical syndrome 02/17/2017  . Hypoglycemia 02/17/2017  . Lesion of nipple 02/17/2017  . DJD (degenerative joint disease) of knee 02/17/2017  . Petechiae 02/17/2017  . Impingement syndrome of right shoulder 02/17/2017  . Seizures ([place]) 02/17/2017  . Anxiety and depression 01/19/2017  . Cervical disc disease 01/19/2017  . Chronic bronchitis ([place]) 01/19/2017  . Contusion of right great toe without damage to nail 01/19/2017  . Migraine aura,  persistent, intractable 01/19/2017  . Viral upper respiratory tract infection 01/19/2017  . Chronic diarrhea 01/14/2017  . Bipolar 2 disorder, major depressive episode ([employer]) 08/26/2016  . Tobacco use disorder, severe, dependence 08/26/2016  . Benzodiazepine dependence ([place]) 08/25/2016  . Pain in right knee 07/02/2016  . Dry eye syndrome 06/17/2016  . Episode of recurrent major depressive disorder ([place]) 06/17/2016  . Essential hypertension 06/17/2016  . GERD without esophagitis 06/17/2016  . Hepatitis C 06/17/2016  . Herniated cervical disc 06/17/2016  . Herpes simplex vulvovaginitis 06/17/2016  . Hypothyroidism (acquired) 06/17/2016  . Insomnia 06/17/2016  . Malaise and fatigue 06/17/2016  . Polycystic kidney disease 06/17/2016  . Primary osteoarthritis involving multiple joints 06/17/2016  . Allergic rhinitis 08/07/2014  . Anxiety state 08/07/2014  . Cough 08/07/2014  . Other amnesia 08/07/2014  . Other fatigue 08/07/2014  .  Severe episode of recurrent major depressive disorder, with psychotic features ([place]) 08/07/2014  . Tobacco use 08/07/2014   Past Medical History:  Diagnosis Date  . Acid reflux   . Cataract   . Chronic kidney disease    Stage 3  . Colitis   . Compound fracture    L1  . COPD (chronic obstructive pulmonary disease) ([place])   . Hepatitis C   . Hypertension   . Neck fracture ([place])   . Neuromuscular disorder ([facility])    neuropathy  . Numbness and tingling   . Ovarian cancer ([place])   . Thyroid disease   . Tremor    Family History  Problem Relation Age of Onset  . Other Mother        unsure of history  . Diabetes Father   . Hypertension Father   . Hypertension Sister   . Hypertension Brother    Past Surgical History:  Procedure Laterality Date  . CHOLECYSTECTOMY    . FOOT SURGERY     ORIF for fracture in the right foot  . IR VERTEBROPLASTY LUMBAR BX INC UNI/BIL INC/INJECT/IMAGING  08/21/2019  . KNEE ARTHROSCOPY    . NECK SURGERY     cervical  fusion  . SHOULDER ARTHROSCOPY    . VAGINAL HYSTERECTOMY     Social History   Occupational History  . Occupation: Disabled  Tobacco Use  . Smoking status: Current Every Day Smoker    Packs/day: 0.25    Years: 37.00    Pack years: 9.25    Types: Cigarettes  . Smokeless tobacco: Never Used  Vaping Use  . Vaping Use: Never used  Substance and Sexual Activity  . Alcohol use: Yes    Comment: RARELY  . Drug use: Never  . Sexual activity: Not Currently

## 2020-04-04 NOTE — Telephone Encounter (Signed)
Left detailed message about this medication refilled not approved

## 2020-04-10 ENCOUNTER — Telehealth: Payer: Self-pay | Admitting: *Deleted

## 2020-04-10 NOTE — Telephone Encounter (Signed)
Pt called stating she is needing the MRI of CSP and R shoulder and Xray of Right shoulder faxed over to [facility] clinic or they will cancel her appt.   I found the orders and faxed to [phone]

## 2020-04-14 DIAGNOSIS — Z9889 Other specified postprocedural states: Secondary | ICD-10-CM | POA: Diagnosis not present

## 2020-04-14 DIAGNOSIS — M25511 Pain in right shoulder: Secondary | ICD-10-CM | POA: Diagnosis not present

## 2020-04-14 DIAGNOSIS — Z981 Arthrodesis status: Secondary | ICD-10-CM | POA: Diagnosis not present

## 2020-04-14 DIAGNOSIS — M542 Cervicalgia: Secondary | ICD-10-CM | POA: Diagnosis not present

## 2020-04-14 DIAGNOSIS — M47812 Spondylosis without myelopathy or radiculopathy, cervical region: Secondary | ICD-10-CM | POA: Diagnosis not present

## 2020-04-14 DIAGNOSIS — M47816 Spondylosis without myelopathy or radiculopathy, lumbar region: Secondary | ICD-10-CM | POA: Diagnosis not present

## 2020-04-14 DIAGNOSIS — G8929 Other chronic pain: Secondary | ICD-10-CM | POA: Diagnosis not present

## 2020-04-14 DIAGNOSIS — M545 Low back pain: Secondary | ICD-10-CM | POA: Diagnosis not present

## 2020-04-18 ENCOUNTER — Telehealth: Payer: Self-pay | Admitting: Family

## 2020-04-18 NOTE — Telephone Encounter (Signed)
Caller Blia Totman  Call Back # [phone]  Patient states that she is seeing [facility] center)  on Monday June 28,2021. Patient would like most recent Lab results faxed to them @ [phone]. Attention to : Dr. Eliane Decree  [facility] # [phone]

## 2020-04-18 NOTE — Telephone Encounter (Signed)
Results faxed.

## 2020-04-21 DIAGNOSIS — M7541 Impingement syndrome of right shoulder: Secondary | ICD-10-CM | POA: Diagnosis not present

## 2020-04-21 DIAGNOSIS — M19011 Primary osteoarthritis, right shoulder: Secondary | ICD-10-CM | POA: Diagnosis not present

## 2020-04-21 DIAGNOSIS — R52 Pain, unspecified: Secondary | ICD-10-CM | POA: Diagnosis not present

## 2020-04-23 ENCOUNTER — Ambulatory Visit: Payer: Medicare Other | Admitting: Family

## 2020-04-30 ENCOUNTER — Telehealth: Payer: Self-pay | Admitting: Physical Medicine and Rehabilitation

## 2020-04-30 DIAGNOSIS — Z719 Counseling, unspecified: Secondary | ICD-10-CM | POA: Diagnosis not present

## 2020-04-30 DIAGNOSIS — I129 Hypertensive chronic kidney disease with stage 1 through stage 4 chronic kidney disease, or unspecified chronic kidney disease: Secondary | ICD-10-CM | POA: Diagnosis not present

## 2020-04-30 DIAGNOSIS — I10 Essential (primary) hypertension: Secondary | ICD-10-CM | POA: Diagnosis not present

## 2020-04-30 DIAGNOSIS — N183 Chronic kidney disease, stage 3 unspecified: Secondary | ICD-10-CM | POA: Diagnosis not present

## 2020-04-30 DIAGNOSIS — Z1389 Encounter for screening for other disorder: Secondary | ICD-10-CM | POA: Diagnosis not present

## 2020-04-30 NOTE — Telephone Encounter (Signed)
Please Advise

## 2020-04-30 NOTE — Telephone Encounter (Signed)
Patient called.   She letting us know that both of her injections, spine and neck, work [employer] consultant. She is now being treated at the neck and spine center in [place] by Dr. Mikeal Hawthorne. He is requesting details of her injection so that they can continue treatment. Patient had no fax for the clinic.   Phone number: [phone]

## 2020-04-30 NOTE — Telephone Encounter (Signed)
Please see message below, pt would like records fax to [place] to Dr. Mikeal Hawthorne office fax # [phone]. Please advise.

## 2020-05-12 ENCOUNTER — Telehealth: Payer: Self-pay

## 2020-05-12 NOTE — Telephone Encounter (Signed)
Patient is in [place], has upcoming appointment with the [facility] at end of the month for chronic back pain.   She is requesting rx for Omeprazole 40 mg #90 Albuterol inhaler (both pending) This will go to [facility] in [place]  She also requested Vicodin. I reminded  her provider told her she can not do her pain management. She understands and she wanted to still ask if she can get some until she can get into the [facility]. "this will have to be sent by an MD to the [facility] in [place].
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
## 2020-05-13 ENCOUNTER — Ambulatory Visit: Payer: Medicare Other | Admitting: Orthopaedic Surgery

## 2020-05-13 MED ORDER — OMEPRAZOLE 40 MG PO CPDR
40.0000 mg | DELAYED_RELEASE_CAPSULE | Freq: Every day | ORAL | 0 refills | Status: DC
Start: 2020-05-13 — End: 2020-10-15

## 2020-05-13 MED ORDER — ALBUTEROL SULFATE HFA 108 (90 BASE) MCG/ACT IN AERS: 2.0000 | INHALATION_SPRAY | RESPIRATORY_TRACT | 8 refills | Status: AC | PRN

## 2020-05-13 NOTE — Telephone Encounter (Signed)
I have sent albuterol and omeprazole.  As we discussed at her visit I will not be handling her pain management. She will need to go to her old pain doctor until she can establish with her new pain clinic.

## 2020-05-14 NOTE — Telephone Encounter (Signed)
Called patient at her father's house in [place], father said she was not home. Called cell number but call keeps getting disconnected, he will let her know we called.

## 2020-05-15 NOTE — Telephone Encounter (Signed)
Patient advised of prescriptions sent and that we can not provide the pain medication. She verbalized understanding.

## 2020-05-15 NOTE — Telephone Encounter (Signed)
Called again but no answer, lvm for patient to call back.

## 2020-05-16 ENCOUNTER — Telehealth: Payer: Self-pay | Admitting: Specialist

## 2020-05-16 NOTE — Telephone Encounter (Signed)
Pt would like to see if Dr. Louanne Skye can get the last 3 office notes from her pain management with [facility] and send to Dr. Clementeen Graham her new pain management   [phone] Dr. Clementeen Graham Fax# [phone]

## 2020-05-20 DIAGNOSIS — M542 Cervicalgia: Secondary | ICD-10-CM | POA: Diagnosis not present

## 2020-05-20 DIAGNOSIS — R3914 Feeling of incomplete bladder emptying: Secondary | ICD-10-CM | POA: Diagnosis not present

## 2020-05-20 DIAGNOSIS — M25562 Pain in left knee: Secondary | ICD-10-CM | POA: Diagnosis not present

## 2020-05-20 DIAGNOSIS — R39198 Other difficulties with micturition: Secondary | ICD-10-CM | POA: Diagnosis not present

## 2020-05-20 DIAGNOSIS — M25511 Pain in right shoulder: Secondary | ICD-10-CM | POA: Diagnosis not present

## 2020-05-20 DIAGNOSIS — N183 Chronic kidney disease, stage 3 unspecified: Secondary | ICD-10-CM | POA: Diagnosis not present

## 2020-05-20 DIAGNOSIS — G8929 Other chronic pain: Secondary | ICD-10-CM | POA: Diagnosis not present

## 2020-05-20 DIAGNOSIS — Z1389 Encounter for screening for other disorder: Secondary | ICD-10-CM | POA: Diagnosis not present

## 2020-05-20 DIAGNOSIS — R339 Retention of urine, unspecified: Secondary | ICD-10-CM | POA: Diagnosis not present

## 2020-05-20 NOTE — Telephone Encounter (Signed)
Tried to call but states that "your call could not be completed at this time" ---- she will need to sign a release for office to be able to get her records.  She can sign it at there office to have them come here or she can come by here and sign one for [place] to send to them.

## 2020-05-21 NOTE — Telephone Encounter (Signed)
Tried to call again but states that "your call could not be completed at this time-please try again later"--I will try one more to call her later.

## 2020-05-22 NOTE — Telephone Encounter (Signed)
3rd try to reach patient--get the same message --- please see my message below if she calls back

## 2020-05-23 DIAGNOSIS — M47817 Spondylosis without myelopathy or radiculopathy, lumbosacral region: Secondary | ICD-10-CM | POA: Diagnosis not present

## 2020-06-10 DIAGNOSIS — K529 Noninfective gastroenteritis and colitis, unspecified: Secondary | ICD-10-CM | POA: Diagnosis not present

## 2020-06-10 DIAGNOSIS — M25562 Pain in left knee: Secondary | ICD-10-CM | POA: Diagnosis not present

## 2020-06-10 DIAGNOSIS — M1712 Unilateral primary osteoarthritis, left knee: Secondary | ICD-10-CM | POA: Diagnosis not present

## 2020-06-10 DIAGNOSIS — M11262 Other chondrocalcinosis, left knee: Secondary | ICD-10-CM | POA: Diagnosis not present

## 2020-06-10 DIAGNOSIS — G8929 Other chronic pain: Secondary | ICD-10-CM | POA: Diagnosis not present

## 2020-06-20 DIAGNOSIS — M47816 Spondylosis without myelopathy or radiculopathy, lumbar region: Secondary | ICD-10-CM | POA: Diagnosis not present

## 2020-06-24 ENCOUNTER — Ambulatory Visit: Payer: Medicare Other | Admitting: Orthopaedic Surgery

## 2020-07-14 ENCOUNTER — Telehealth: Payer: Self-pay | Admitting: Orthopaedic Surgery

## 2020-07-14 NOTE — Telephone Encounter (Signed)
Patient called.   She made an appointment for next week for knee pain and wanted to know if she would be able to get injections that day  Call back: [phone]

## 2020-07-14 NOTE — Telephone Encounter (Signed)
Called patient. It has been over 3 months since she received injections, therefore it should not be an issue to receive more injections next week.

## 2020-07-18 DIAGNOSIS — G8929 Other chronic pain: Secondary | ICD-10-CM | POA: Diagnosis not present

## 2020-07-18 DIAGNOSIS — M47816 Spondylosis without myelopathy or radiculopathy, lumbar region: Secondary | ICD-10-CM | POA: Diagnosis not present

## 2020-07-18 DIAGNOSIS — M5441 Lumbago with sciatica, right side: Secondary | ICD-10-CM | POA: Diagnosis not present

## 2020-07-22 ENCOUNTER — Telehealth: Payer: Self-pay | Admitting: Family

## 2020-07-22 DIAGNOSIS — G894 Chronic pain syndrome: Secondary | ICD-10-CM

## 2020-07-22 NOTE — Telephone Encounter (Signed)
Please advise 

## 2020-07-22 NOTE — Telephone Encounter (Signed)
[facility] Fax # [phone]

## 2020-07-22 NOTE — Telephone Encounter (Signed)
Patient states she need a referral to [facility], [place]  [address]

## 2020-07-22 NOTE — Telephone Encounter (Signed)
Referral has been place.

## 2020-07-23 ENCOUNTER — Other Ambulatory Visit: Payer: Self-pay

## 2020-07-23 ENCOUNTER — Telehealth: Payer: Self-pay

## 2020-07-23 ENCOUNTER — Encounter: Payer: Self-pay | Admitting: Orthopaedic Surgery

## 2020-07-23 ENCOUNTER — Ambulatory Visit (INDEPENDENT_AMBULATORY_CARE_PROVIDER_SITE_OTHER): Payer: Medicare Other | Admitting: Orthopaedic Surgery

## 2020-07-23 VITALS — Ht 64.0 in | Wt 171.0 lb

## 2020-07-23 DIAGNOSIS — M170 Bilateral primary osteoarthritis of knee: Secondary | ICD-10-CM

## 2020-07-23 MED ORDER — LIDOCAINE HCL 1 % IJ SOLN
2.0000 mL | INTRAMUSCULAR | Status: AC | PRN
  Administered 2020-07-23: 2 mL

## 2020-07-23 MED ORDER — BUPIVACAINE HCL 0.5 % IJ SOLN
2.0000 mL | INTRAMUSCULAR | Status: AC | PRN
  Administered 2020-07-23: 2 mL via INTRA_ARTICULAR

## 2020-07-23 MED ORDER — METHYLPREDNISOLONE ACETATE 40 MG/ML IJ SUSP
80.0000 mg | INTRAMUSCULAR | Status: AC | PRN
  Administered 2020-07-23: 80 mg via INTRA_ARTICULAR

## 2020-07-23 NOTE — Telephone Encounter (Signed)
This has been entered by Debbrah Alar on 07/22/20

## 2020-07-23 NOTE — Telephone Encounter (Signed)
Patient wants ref to be seen at pain management clinic ,  [facility] in [place].

## 2020-07-23 NOTE — Telephone Encounter (Signed)
Patient wants ref to be seen back at pain management center in [place].

## 2020-07-23 NOTE — Progress Notes (Signed)
Office Visit Note   Patient: Raven Hansen           Date of Birth: 1961-08-08           MRN: [id number] Visit Date: 07/23/2020              Requested by: Debbrah Alar, NP [address] [place],  [place] [address] PCP: Debbrah Alar, NP   Assessment & Plan: Visit Diagnoses:  1. Bilateral primary osteoarthritis of knee     Plan: Ms. Pugmire returns for evaluation of bilateral knee pain.  I have seen her on multiple occasions in the past for bilateral knee cortisone injections.  Since I last saw her she was evaluated at one of the [place] offices x-rays were obtained in the demonstrated significant issues with the knee including instability and arthritis and was given cortisone injections.  The office visit was in May.  At 1 point she denied that but discussed the records with her.  She simply cannot continue to see multiple doctors for the same problem and I discussed that with her as well.  She has multiple medical problems and presently is being evaluated through the [facility] clinic for issues with her back.  I think at some point she may be a candidate for knee replacements but she has so many overwhelming other issues not sure that that is a good idea but at the present time.  She has been referred to a pain clinic by her primary care physician.  We will plan to see her back as needed  Follow-Up Instructions: Return if symptoms worsen or fail to improve.   Orders:  Orders Placed This Encounter  Procedures  . Large Joint Inj: bilateral knee   No orders of the defined types were placed in this encounter.     Procedures: Large Joint Inj: bilateral knee on 07/23/2020 4:17 PM Indications: diagnostic evaluation Details: 25 G 1.5 in needle, anteromedial approach  Arthrogram: No  Medications (Right): 2 mL lidocaine 1 %; 2 mL bupivacaine 0.5 %; 80 mg methylPREDNISolone acetate 40 MG/ML Medications (Left): 2 mL lidocaine 1 %; 2 mL bupivacaine 0.5 %; 80 mg  methylPREDNISolone acetate 40 MG/ML Consent was given by the patient. Immediately prior to procedure a time out was called to verify the correct patient, procedure, equipment, support staff and site/side marked as required. Patient was prepped and draped in the usual sterile fashion.       Clinical Data: No additional findings.   Subjective: Chief Complaint  Patient presents with  . Right Knee - Follow-up  . Left Knee - Follow-up  Patient presents today for chronic bilateral knee pain. She is wanting injections in both knees today. Her last injections were done with [facility] on 03/13/2020. HPI  Review of Systems   Objective: Vital Signs: Ht 5\' 4"  (1.626 m)   Wt 171 lb (77.6 kg)   BMI 29.35 kg/m   Physical Exam Constitutional:      Appearance: She is well-developed.  Eyes:     Pupils: Pupils are equal, round, and reactive to light.  Pulmonary:     Effort: Pulmonary effort is normal.  Skin:    General: Skin is warm and dry.  Neurological:     Mental Status: She is alert and oriented to person, place, and time.  Psychiatric:        Behavior: Behavior normal.     Ortho Exam awake alert and oriented x3.  Very talkative.  Seems a  little disheveled.  Neither knee was hot red warm or swollen.  No effusion.  Has multiple scars particularly on the left knee where she has had multiple surgeries.  Having predominately medial joint pain.  Exam is basically unchanged from prior evaluations Specialty Comments:  No specialty comments available.  Imaging: No results found.   PMFS History: Patient Active Problem List   Diagnosis Date Noted  . Genital herpes simplex 04/03/2020  . Migraine without status migrainosus, not intractable 04/03/2020  . Peripheral neuropathy 04/03/2020  . Labral tear of shoulder, right, initial encounter 02/12/2020  . Primary osteoarthritis, right shoulder 02/12/2020  . Pseudogout of left knee 11/06/2019  . Pseudogout of right knee 11/06/2019  .  COPD with acute exacerbation ([place]) 09/25/2019  . AKI (acute kidney injury) ([place]) 09/25/2019  . Chronic back pain 09/25/2019  . Acute on chronic kidney failure ([place]) 09/25/2019  . Acute respiratory failure with hypoxemia ([place]) 09/25/2019  . Numbness 08/30/2019  . Confusion 08/30/2019  . Chondrocalcinosis due to dicalcium phosphate crystals, of the knee, right 04/12/2019  . Chronic pain of both shoulders 01/30/2019  . Bilateral primary osteoarthritis of knee 01/08/2019  . Exposure to communicable disease 05/09/2018  . Flying phobia 05/09/2018  . H/O bee sting allergy 05/09/2018  . Mixed hyperlipidemia 05/09/2018  . Allergic contact dermatitis 04/14/2018  . Preop general physical exam 12/14/2017  . Mass of right hand 07/14/2017  . Cat scratch of forearm, right, initial encounter 06/30/2017  . Family history of breast cancer 06/22/2017  . History of ovarian cancer 06/22/2017  . Bipolar affective disorder in remission ([place]) 04/13/2017  . Allergic dermatitis due to poison ivy 03/10/2017  . Myofascial pain 03/10/2017  . Acute bronchitis due to Mycoplasma pneumoniae 02/17/2017  . Anemia 02/17/2017  . Cervical radiculopathy 02/17/2017  . Chronic pain syndrome 02/17/2017  . Complete tear of anterior cruciate ligament of knee 02/17/2017  . Dental infection 02/17/2017  . Epidermal cyst 02/17/2017  . Failed back surgical syndrome 02/17/2017  . Hypoglycemia 02/17/2017  . Lesion of nipple 02/17/2017  . DJD (degenerative joint disease) of knee 02/17/2017  . Petechiae 02/17/2017  . Impingement syndrome of right shoulder 02/17/2017  . Seizures ([place]) 02/17/2017  . Anxiety and depression 01/19/2017  . Cervical disc disease 01/19/2017  . Chronic bronchitis ([place]) 01/19/2017  . Contusion of right great toe without damage to nail 01/19/2017  . Migraine aura, persistent, intractable 01/19/2017  . Viral upper respiratory tract infection 01/19/2017  . Chronic diarrhea 01/14/2017  . Bipolar 2  disorder, major depressive episode ([place]) 08/26/2016  . Tobacco use disorder, severe, dependence 08/26/2016  . Benzodiazepine dependence ([place]) 08/25/2016  . Pain in right knee 07/02/2016  . Dry eye syndrome 06/17/2016  . Episode of recurrent major depressive disorder ([place]) 06/17/2016  . Essential hypertension 06/17/2016  . GERD without esophagitis 06/17/2016  . Hepatitis C 06/17/2016  . Herniated cervical disc 06/17/2016  . Herpes simplex vulvovaginitis 06/17/2016  . Hypothyroidism (acquired) 06/17/2016  . Insomnia 06/17/2016  . Malaise and fatigue 06/17/2016  . Polycystic kidney disease 06/17/2016  . Primary osteoarthritis involving multiple joints 06/17/2016  . Allergic rhinitis 08/07/2014  . Anxiety 08/07/2014  . Cough 08/07/2014  . Other amnesia 08/07/2014  . Other fatigue 08/07/2014  . Severe episode of recurrent major depressive disorder, with psychotic features ([place]) 08/07/2014  . Tobacco use 08/07/2014   Past Medical History:  Diagnosis Date  . Acid reflux   . Cataract   . Chronic kidney disease    Stage  3  . Colitis   . Compound fracture    L1  . COPD (chronic obstructive pulmonary disease) ([place])   . Hepatitis C   . Hypertension   . Neck fracture ([place])   . Neuromuscular disorder ([facility])    neuropathy  . Numbness and tingling   . Ovarian cancer ([place])   . Thyroid disease   . Tremor     Family History  Problem Relation Age of Onset  . Other Mother        unsure of history  . Diabetes Father   . Hypertension Father   . Hypertension Sister   . Hypertension Brother     Past Surgical History:  Procedure Laterality Date  . CHOLECYSTECTOMY    . FOOT SURGERY     ORIF for fracture in the right foot  . IR VERTEBROPLASTY LUMBAR BX INC UNI/BIL INC/INJECT/IMAGING  08/21/2019  . KNEE ARTHROSCOPY    . NECK SURGERY     cervical fusion  . SHOULDER ARTHROSCOPY    . VAGINAL HYSTERECTOMY     Social History   Occupational History  . Occupation: Disabled  Tobacco  Use  . Smoking status: Current Every Day Smoker    Packs/day: 0.25    Years: 37.00    Pack years: 9.25    Types: Cigarettes  . Smokeless tobacco: Never Used  Vaping Use  . Vaping Use: Never used  Substance and Sexual Activity  . Alcohol use: Yes    Comment: RARELY  . Drug use: Never  . Sexual activity: Not Currently

## 2020-07-23 NOTE — Telephone Encounter (Signed)
Order has already been placed through Debbrah Alar, NP as of 07/22/2020.

## 2020-08-11 ENCOUNTER — Emergency Department (HOSPITAL_COMMUNITY): Payer: Medicare Other

## 2020-08-11 ENCOUNTER — Other Ambulatory Visit: Payer: Self-pay

## 2020-08-11 ENCOUNTER — Emergency Department (HOSPITAL_COMMUNITY)
Admission: EM | Admit: 2020-08-11 | Discharge: 2020-08-11 | Disposition: A | Discharge status: Home / Self Care | Payer: Medicare Other | Attending: Emergency Medicine | Admitting: Emergency Medicine

## 2020-08-11 ENCOUNTER — Encounter (HOSPITAL_COMMUNITY): Payer: Self-pay

## 2020-08-11 DIAGNOSIS — Z79899 Other long term (current) drug therapy: Secondary | ICD-10-CM | POA: Insufficient documentation

## 2020-08-11 DIAGNOSIS — Z8543 Personal history of malignant neoplasm of ovary: Secondary | ICD-10-CM | POA: Insufficient documentation

## 2020-08-11 DIAGNOSIS — W1830XA Fall on same level, unspecified, initial encounter: Secondary | ICD-10-CM | POA: Diagnosis not present

## 2020-08-11 DIAGNOSIS — W19XXXA Unspecified fall, initial encounter: Secondary | ICD-10-CM

## 2020-08-11 DIAGNOSIS — J441 Chronic obstructive pulmonary disease with (acute) exacerbation: Secondary | ICD-10-CM | POA: Diagnosis not present

## 2020-08-11 DIAGNOSIS — E039 Hypothyroidism, unspecified: Secondary | ICD-10-CM | POA: Diagnosis not present

## 2020-08-11 DIAGNOSIS — I129 Hypertensive chronic kidney disease with stage 1 through stage 4 chronic kidney disease, or unspecified chronic kidney disease: Secondary | ICD-10-CM | POA: Diagnosis not present

## 2020-08-11 DIAGNOSIS — S8992XA Unspecified injury of left lower leg, initial encounter: Secondary | ICD-10-CM | POA: Diagnosis present

## 2020-08-11 DIAGNOSIS — Y9301 Activity, walking, marching and hiking: Secondary | ICD-10-CM | POA: Insufficient documentation

## 2020-08-11 DIAGNOSIS — S82832A Other fracture of upper and lower end of left fibula, initial encounter for closed fracture: Secondary | ICD-10-CM | POA: Insufficient documentation

## 2020-08-11 DIAGNOSIS — N183 Chronic kidney disease, stage 3 unspecified: Secondary | ICD-10-CM | POA: Diagnosis not present

## 2020-08-11 DIAGNOSIS — F17210 Nicotine dependence, cigarettes, uncomplicated: Secondary | ICD-10-CM | POA: Insufficient documentation

## 2020-08-11 MED ORDER — OXYCODONE-ACETAMINOPHEN 5-325 MG PO TABS
1.0000 | ORAL_TABLET | Freq: Once | ORAL | Status: AC
  Administered 2020-08-11: 1 via ORAL
  Filled 2020-08-11: qty 1

## 2020-08-11 NOTE — ED Notes (Signed)
PATIENT IS OUTSIDE

## 2020-08-11 NOTE — Discharge Instructions (Addendum)
At this time there does not appear to be the presence of an emergent medical condition, however there is always the potential for conditions to change. Please read and follow the below instructions.  Please return to the Emergency Department immediately for any new or worsening symptoms. Please be sure to follow up with your Primary Care Provider within one week regarding your visit today; please call their office to schedule an appointment even if you are feeling better for a follow-up visit. Call the orthopedic specialist Dr. Marcelino Scot under discharge paperwork to schedule a follow-up appointment for definitive care of your fibula fracture.  You may also follow-up with your orthopedist as well for further evaluation.  Go to the nearest Emergency Department immediately if: You have fever or chills Your skin or nails below your injury: Turn blue or gray. Feel cold. Become numb. Become less sensitive to touch. You develop new or severe pain in your leg or foot. You have tingling, burning, and tightness in your lower leg. You have any new/concerning or worsening of symptoms  Please read the additional information packets attached to your discharge summary.  Do not take your medicine if  develop an itchy rash, swelling in your mouth or lips, or difficulty breathing; call 911 and seek immediate emergency medical attention if this occurs.  You may review your lab tests and imaging results in their entirety on your MyChart account.  Please discuss all results of fully with your primary care provider and other specialist at your follow-up visit.  Note: Portions of this text may have been transcribed using voice recognition software. Every effort was made to ensure accuracy; however, inadvertent computerized transcription errors may still be present.

## 2020-08-11 NOTE — ED Provider Notes (Addendum)
[facility] EMERGENCY DEPARTMENT Provider Note   CSN: [id number] Arrival date & time: 08/11/20  1225     History Chief Complaint  Patient presents with   Lytle Michaels    Raven Hansen is a 59 y.o. female history of CKD, COPD, hepatitis, hypertension, chronic back pain, cancer.  Patient presents today after a fall, she was walking around at around 1 AM last night when she walked into her bathroom and slipped on her cats food and water bowl.  She fell onto her left ankle.  She reports immediate pain to her left lateral malleolus throbbing moderate intensity constant worsened with ambulation improved with rest and elevation, associated with lateral swelling and bruising.  Denies head/neck injury, loss of consciousness, blood thinner use, back pain, chest pain, abdominal pain, nausea/vomiting, headache, numbness/weakness, tingling, skin break/bleeding or any additional concerns. HPI     Past Medical History:  Diagnosis Date   Acid reflux    Cataract    Chronic kidney disease    Stage 3   Colitis    Compound fracture    L1   COPD (chronic obstructive pulmonary disease) (HCC)    Hepatitis C    Hypertension    Neck fracture (HCC)    Neuromuscular disorder (HCC)    neuropathy   Numbness and tingling    Ovarian cancer ([place])    Thyroid disease    Tremor     Patient Active Problem List   Diagnosis Date Noted   Genital herpes simplex 04/03/2020   Migraine without status migrainosus, not intractable 04/03/2020   Peripheral neuropathy 04/03/2020   Labral tear of shoulder, right, initial encounter 02/12/2020   Primary osteoarthritis, right shoulder 02/12/2020   Pseudogout of left knee 11/06/2019   Pseudogout of right knee 11/06/2019   COPD with acute exacerbation ([place]) 09/25/2019   AKI (acute kidney injury) ([place]) 09/25/2019   Chronic back pain 09/25/2019   Acute on chronic kidney failure ([place]) 09/25/2019   Acute respiratory failure  with hypoxemia ([place]) 09/25/2019   Numbness 08/30/2019   Confusion 08/30/2019   Chondrocalcinosis due to dicalcium phosphate crystals, of the knee, right 04/12/2019   Chronic pain of both shoulders 01/30/2019   Bilateral primary osteoarthritis of knee 01/08/2019   Exposure to communicable disease 05/09/2018   Flying phobia 05/09/2018   H/O bee sting allergy 05/09/2018   Mixed hyperlipidemia 05/09/2018   Allergic contact dermatitis 04/14/2018   Preop general physical exam 12/14/2017   Mass of right hand 07/14/2017   Cat scratch of forearm, right, initial encounter 06/30/2017   Family history of breast cancer 06/22/2017   History of ovarian cancer 06/22/2017   Bipolar affective disorder in remission ([place]) 04/13/2017   Allergic dermatitis due to poison ivy 03/10/2017   Myofascial pain 03/10/2017   Acute bronchitis due to Mycoplasma pneumoniae 02/17/2017   Anemia 02/17/2017   Cervical radiculopathy 02/17/2017   Chronic pain syndrome 02/17/2017   Complete tear of anterior cruciate ligament of knee 02/17/2017   Dental infection 02/17/2017   Epidermal cyst 02/17/2017   Failed back surgical syndrome 02/17/2017   Hypoglycemia 02/17/2017   Lesion of nipple 02/17/2017   DJD (degenerative joint disease) of knee 02/17/2017   Petechiae 02/17/2017   Impingement syndrome of right shoulder 02/17/2017   Seizures ([place]) 02/17/2017   Anxiety and depression 01/19/2017   Cervical disc disease 01/19/2017   Chronic bronchitis ([place]) 01/19/2017   Contusion of right great toe without damage to nail 01/19/2017   Migraine aura, persistent, intractable 01/19/2017  Viral upper respiratory tract infection 01/19/2017   Chronic diarrhea 01/14/2017   Bipolar 2 disorder, major depressive episode ([place]) 08/26/2016   Tobacco use disorder, severe, dependence 08/26/2016   Benzodiazepine dependence ([place]) 08/25/2016   Pain in right knee 07/02/2016   Dry eye syndrome  06/17/2016   Episode of recurrent major depressive disorder ([place]) 06/17/2016   Essential hypertension 06/17/2016   GERD without esophagitis 06/17/2016   Hepatitis C 06/17/2016   Herniated cervical disc 06/17/2016   Herpes simplex vulvovaginitis 06/17/2016   Hypothyroidism (acquired) 06/17/2016   Insomnia 06/17/2016   Malaise and fatigue 06/17/2016   Polycystic kidney disease 06/17/2016   Primary osteoarthritis involving multiple joints 06/17/2016   Allergic rhinitis 08/07/2014   Anxiety 08/07/2014   Cough 08/07/2014   Other amnesia 08/07/2014   Other fatigue 08/07/2014   Severe episode of recurrent major depressive disorder, with psychotic features ([place]) 08/07/2014   Tobacco use 08/07/2014    Past Surgical History:  Procedure Laterality Date   CHOLECYSTECTOMY     FOOT SURGERY     ORIF for fracture in the right foot   IR VERTEBROPLASTY LUMBAR BX INC UNI/BIL INC/INJECT/IMAGING  08/21/2019   KNEE ARTHROSCOPY     NECK SURGERY     cervical fusion   SHOULDER ARTHROSCOPY     VAGINAL HYSTERECTOMY       OB History   No obstetric history on file.     Family History  Problem Relation Age of Onset   Other Mother        unsure of history   Diabetes Father    Hypertension Father    Hypertension Sister    Hypertension Brother     Social History   Tobacco Use   Smoking status: Current Every Day Smoker    Packs/day: 0.25    Years: 37.00    Pack years: 9.25    Types: Cigarettes   Smokeless tobacco: Never Used  Scientific laboratory technician Use: Never used  Substance Use Topics   Alcohol use: Yes    Comment: RARELY   Drug use: Never    Home Medications Prior to Admission medications   Medication Sig Start Date End Date Taking? Authorizing Provider  albuterol (VENTOLIN HFA) 108 (90 Base) MCG/ACT inhaler Inhale 2 puffs into the lungs every 4 (four) hours as needed for wheezing. 05/13/20   Debbrah Alar, NP  amLODipine (NORVASC) 10 MG  tablet Take 1 tablet (10 mg total) by mouth daily. 03/31/20   Debbrah Alar, NP  atenolol (TENORMIN) 100 MG tablet Take 1 tablet (100 mg total) by mouth daily. 03/31/20   Debbrah Alar, NP  carisoprodol (SOMA) 350 MG tablet Take 350 mg by mouth 3 (three) times daily as needed. 09/18/19   [provider]  cholestyramine (QUESTRAN) 4 g packet Take 4 g by mouth 4 (four) times daily.  07/13/18   [provider]  escitalopram (LEXAPRO) 20 MG tablet Take 1 tablet (20 mg total) by mouth daily. 03/31/20   Debbrah Alar, NP  fluconazole (DIFLUCAN) 150 MG tablet Take 1 tablet by mouth for vaginal yeast infection, you may repeat in 3 days if needed. 03/31/20   Debbrah Alar, NP  fluticasone (FLONASE) 50 MCG/ACT nasal spray Place 1 spray into both nostrils 2 (two) times daily.  08/18/18   [provider]  levothyroxine (SYNTHROID) 125 MCG tablet Take 1 tablet (125 mcg total) by mouth daily. 03/31/20   Debbrah Alar, NP  methocarbamol (ROBAXIN) 500 MG tablet Take 1 tablet (500  mg total) by mouth 4 (four) times daily as needed for muscle spasms. 03/31/20   Debbrah Alar, NP  nystatin (MYCOSTATIN) 100000 UNIT/ML suspension Take 5 mLs (500,000 Units total) by mouth 4 (four) times daily. 97ml swish 03/31/20   Debbrah Alar, NP  omeprazole (PRILOSEC) 40 MG capsule Take 1 capsule (40 mg total) by mouth daily. 05/13/20   Debbrah Alar, NP  oxycodone (OXY-IR) 5 MG capsule Take 1 capsule (5 mg total) by mouth every 4 (four) hours as needed. 11/12/19   Jessy Oto, MD  Spacer/Aero-Holding Chambers (AEROCHAMBER MV) inhaler Use as instructed 09/27/19   Nita Sells, MD  SUMAtriptan (IMITREX) 100 MG tablet Take 1 tablet (100 mg total) by mouth every 2 (two) hours as needed for migraine. 03/31/20   Debbrah Alar, NP  tiotropium (SPIRIVA HANDIHALER) 18 MCG inhalation capsule Place 1 capsule (18 mcg total) into inhaler and inhale daily. 09/27/19 09/26/20   Nita Sells, MD  valACYclovir (VALTREX) 1000 MG tablet Take 1 tablet (1,000 mg total) by mouth as needed (for outbreak). 03/31/20   Debbrah Alar, NP    Allergies    Bee venom, Meperidine hcl, and Meperidine  Review of Systems   Review of Systems  Constitutional: Negative.  Negative for chills and fever.  Respiratory: Negative.  Negative for shortness of breath.   Cardiovascular: Negative.  Negative for chest pain.  Gastrointestinal: Negative.  Negative for abdominal pain, nausea and vomiting.  Musculoskeletal: Positive for arthralgias (Left ankle). Negative for back pain, myalgias and neck pain.  Skin: Positive for color change (Bruising). Negative for wound.  Neurological: Negative.  Negative for syncope, weakness, numbness and headaches.   Physical Exam Updated Vital Signs BP (!) 161/77 (BP Location: Right Arm)    Pulse (!) 57    Temp 97.8 F (36.6 C) (Oral)    Resp 17    SpO2 97%   Physical Exam Constitutional:      General: She is not in acute distress.    Appearance: Normal appearance. She is well-developed. She is not ill-appearing or diaphoretic.  HENT:     Head: Normocephalic and atraumatic.  Eyes:     General: Vision grossly intact. Gaze aligned appropriately.     Pupils: Pupils are equal, round, and reactive to light.  Neck:     Trachea: Trachea and phonation normal.  Cardiovascular:     Pulses:          Dorsalis pedis pulses are 2+ on the right side and 2+ on the left side.       Posterior tibial pulses are 2+ on the right side and 2+ on the left side.  Pulmonary:     Effort: Pulmonary effort is normal. No respiratory distress.  Abdominal:     General: There is no distension.     Palpations: Abdomen is soft.     Tenderness: There is no abdominal tenderness. There is no guarding or rebound.  Musculoskeletal:        General: Normal range of motion.     Cervical back: Normal range of motion.       Feet:     Comments: Appropriate range of motion  and strength of the bilateral upper extremities without pain. - No midline C/T/L spinal tenderness to palpation, no paraspinal muscle tenderness, no deformity, crepitus, or step-off noted. - Appropriate range of motion and strength of all major joints of the right lower extremity without pain. - Appropriate range of motion and strength at the left hip knee and  toes without pain.  No pain with palpation of the thigh or lower leg.   - Patient has swelling and contusion around the left lateral malleolus without skin break.  Tenderness of the distal fibula.  No tenting of the skin.  Capillary refill and sensation intact to all toes, strong equal pedal pulses.  Range of motion at the left ankle is intact with increased pain.  Compartments soft.  Feet:     Right foot:     Protective Sensation: 5 sites tested. 5 sites sensed.     Skin integrity: Skin integrity normal.     Left foot:     Protective Sensation: 5 sites tested. 5 sites sensed.     Skin integrity: Skin integrity normal.  Skin:    General: Skin is warm and dry.  Neurological:     Mental Status: She is alert.     GCS: GCS eye subscore is 4. GCS verbal subscore is 5. GCS motor subscore is 6.     Comments: Speech is clear and goal oriented, follows commands Major Cranial nerves without deficit, no facial droop Moves extremities without ataxia, coordination intact  Psychiatric:        Behavior: Behavior normal.     ED Results / Procedures / Treatments   Labs (all labs ordered are listed, but only abnormal results are displayed) Labs Reviewed - No data to display  EKG None  Radiology DG Ankle Complete Left  Result Date: 08/11/2020 CLINICAL DATA:  Acute left ankle pain after fall yesterday. EXAM: LEFT ANKLE COMPLETE - 3+ VIEW COMPARISON:  None. FINDINGS: Mildly displaced distal left fibular fracture is noted. No other bony abnormality is noted. No soft tissue abnormality is noted. IMPRESSION: Mildly displaced distal left  fibular fracture. Electronically Signed   By: Marijo Conception M.D.   On: 08/11/2020 14:10   DG Foot Complete Left  Result Date: 08/11/2020 CLINICAL DATA:  Acute left foot pain after fall yesterday. EXAM: LEFT FOOT - COMPLETE 3+ VIEW COMPARISON:  None. FINDINGS: Mildly displaced fracture is seen involving the distal left fibula. No other bony abnormality is noted. Joint spaces are intact. IMPRESSION: Mildly displaced distal left fibular fracture. Electronically Signed   By: Marijo Conception M.D.   On: 08/11/2020 14:09    Procedures Procedures (including critical care time)  Medications Ordered in ED Medications  oxyCODONE-acetaminophen (PERCOCET/ROXICET) 5-325 MG per tablet 1 tablet (1 tablet Oral Given 08/11/20 1811)    ED Course  I have reviewed the triage vital signs and the nursing notes.  Pertinent labs & imaging results that were available during my care of the patient were reviewed by me and considered in my medical decision making (see chart for details).    MDM Rules/Calculators/A&P                         Additional history obtained from: 1. Nursing notes from this visit. ----------------- DG Left Ankle:    IMPRESSION:  Mildly displaced distal left fibular fracture.   DG Left Foot:  IMPRESSION:  Mildly displaced distal left fibular fracture.  ------------------------------ 59 year old female presents after mechanical fall last night she slipped on her cat's water bowl.  She suffered a distal left fibular fracture that is mildly displaced above.  There is no tenting or skin break.  She is neurovascular intact strong equal pedal pulses good capillary refill and sensation to all toes.  Compartments are soft.  Range of motion of the left  ankle is intact with some increased pain.  No evidence of her cellulitis, septic arthritis, DVT, compartment syndrome, neurovascular compromise or other emergent pathologies.  There is no indication for reduction at this time.  She is placed  in a posterior + stirrup short leg splint.  She refused crutches, citing that she had those at home.  Pain was controlled with Percocet in the ER.  She has no pain at the foot/toes, lower leg, knee, thigh or hip.  Patient was offered an x-ray of her tib-fib/knee however she refused today, I have low suspicion for more proximal fracture at this time.  She will be given orthopedic referral.  Additionally patient without blood thinner use and no history or evidence of head injury, neck injury or injury of the chest abdomen pelvis or other extremities.  PDMP reviewed patient receives monthly Percocet prescription last refilled October 8, will not provide additional narcotics, she will use rice therapy.  At this time there does not appear to be any evidence of an acute emergency medical condition and the patient appears stable for discharge with appropriate outpatient follow up. Diagnosis was discussed with patient who verbalizes understanding of care plan and is agreeable to discharge. I have discussed return precautions with patient and husband who verbalizes understanding. Patient encouraged to follow-up with their PCP and orthopedic. All questions answered.  Patient's case discussed with Dr. Eulis Foster who agrees with plan to discharge with splint/crutches and orthopedic follow-up.  ---- Patient reassessed after splint application, patient reports it is comfortable, capillary fill sensation intact to the toes.  Note: Portions of this report may have been transcribed using voice recognition software. Every effort was made to ensure accuracy; however, inadvertent computerized transcription errors may still be present. Final Clinical Impression(s) / ED Diagnoses Final diagnoses:  Fall  Closed fracture of distal end of left fibula, unspecified fracture morphology, initial encounter    Rx / DC Orders ED Discharge Orders    None       Gari Crown 08/11/20 1852    Daleen Bo,  MD 08/12/20 1607    Deliah Boston, PA-C 08/12/20 1740    Daleen Bo, MD 08/12/20 [phone]

## 2020-08-11 NOTE — Progress Notes (Signed)
Orthopedic Tech Progress Note Patient Details:  Raven Hansen 25-Dec-1960 [id number] Patient already has crutches. Ortho Devices Type of Ortho Device: Stirrup splint, Post (short leg) splint Ortho Device/Splint Location: LLE Ortho Device/Splint Interventions: Application, Ordered   Post Interventions Patient Tolerated: Well Instructions Provided: Care of device   [name] [name] 08/11/2020, 6:32 PM

## 2020-08-11 NOTE — ED Triage Notes (Signed)
Pt reports mechanical fall yesterday, injured left ankle, swelling noted.

## 2020-08-13 ENCOUNTER — Encounter: Payer: Self-pay | Admitting: Family

## 2020-08-13 ENCOUNTER — Ambulatory Visit (INDEPENDENT_AMBULATORY_CARE_PROVIDER_SITE_OTHER): Payer: Medicare Other | Admitting: Family

## 2020-08-13 VITALS — Ht 64.0 in | Wt 171.0 lb

## 2020-08-13 DIAGNOSIS — S82832A Other fracture of upper and lower end of left fibula, initial encounter for closed fracture: Secondary | ICD-10-CM | POA: Diagnosis not present

## 2020-08-13 MED ORDER — OXYCODONE-ACETAMINOPHEN 5-325 MG PO TABS
1.0000 | ORAL_TABLET | Freq: Two times a day (BID) | ORAL | 0 refills | Status: AC | PRN
Start: 2020-08-13 — End: ?

## 2020-08-13 MED ORDER — OXYCODONE-ACETAMINOPHEN 5-325 MG PO TABS
1.0000 | ORAL_TABLET | Freq: Two times a day (BID) | ORAL | 0 refills | Status: DC | PRN
Start: 2020-08-13 — End: 2020-08-13

## 2020-08-13 NOTE — Progress Notes (Signed)
Office Visit Note   Patient: Raven Hansen           Date of Birth: [birth year]           MRN: [id number] Visit Date: 08/13/2020              Requested by: Debbrah Alar, NP [address] [place],  [place] [address] PCP: Debbrah Alar, NP  Chief Complaint  Patient presents with  . Left Ankle - Pain    DOI 08/11/20 to ER for eval mildly displaced fib fx       HPI: The patient is a 59 year old woman who presents today for initial evaluation of a left ankle injury.  She states that she fell on October 17 she tripped stepping in her cat's water bowl.  She did have an emergency department visit on October 18 for the same.  Radiographs done in the emergency department revealed a distal fibula fracture.  She was placed in a posterior splint given crutches, advised nonweightbearing  States is going to [facility] [place] for ablation surgery on her back will not be back until November 12.  Of note she is in pain management taking 10 mg oxycodone 3 times daily with [place]: Visit Diagnoses: No diagnosis found.  Plan: Discussed case and radiographs with Dr. Ninfa Linden.  Feel that she will do well with nonoperative conservative management will place in a cam walker discussed using a kneeling scooter for compliance with nonweightbearing  Advised to speak with pain management prior to filling rx for pain medication for their approval  Follow-Up Instructions: No follow-ups on file.   Left Ankle Exam   Tenderness  The patient is experiencing tenderness in the lateral malleolus.       Patient is alert, oriented, no adenopathy, well-dressed, normal affect, normal respiratory effort. On examination of the left ankle there is no gross deformity she does have scattered ecchymosis moderate edema of the foot and ankle there is no blistering no impending skin breakdown  Imaging: No results found. No images are attached to  the encounter.  Labs: Lab Results  Component Value Date   HGBA1C 5.4 08/30/2019   REPTSTATUS 09/26/2019 FINAL 09/26/2019   GRAMSTAIN  08/21/2019    ABUNDANT WBC PRESENT,BOTH PMN AND MONONUCLEAR NO ORGANISMS SEEN    CULT  09/25/2019    NO GROWTH 5 DAYS Performed at [facility], [place] [address], [place], [place] [address]    CULT  09/25/2019    NO GROWTH 5 DAYS Performed at [facility] [address], [place], [place] [address]      Lab Results  Component Value Date   ALBUMIN 3.7 09/26/2019    Lab Results  Component Value Date   MG 3.0 (H) 09/25/2019   No results found for: VD25OH  No results found for: PREALBUMIN CBC EXTENDED Latest Ref Rng & Units 09/27/2019 09/26/2019 09/25/2019  WBC 4.0 - 10.5 K/uL 12.0(H) 11.5(H) 16.5(H)  RBC 3.87 - 5.11 MIL/uL 3.38(L) 3.48(L) 3.72(L)  HGB 12.0 - 15.0 g/dL 11.4(L) 11.8(L) 12.7  HCT 36 - 46 % 34.1(L) 35.0(L) 36.9  PLT 150 - 400 K/uL 162 178 175  NEUTROABS 1.7 - 7.7 K/uL 7.9(H) 9.2(H) -  LYMPHSABS 0.7 - 4.0 K/uL 2.6 1.4 -     Body mass index is 29.35 kg/m.  Orders:  No orders of the defined types were placed in this encounter.  No orders of the  defined types were placed in this encounter.    Procedures: No procedures performed  Clinical Data: No additional findings.  ROS:  All other systems negative, except as noted in the HPI. Review of Systems  Constitutional: Negative for chills and fever.    Objective: Vital Signs: Ht 5\' 4"  (1.626 m)   Wt 171 lb (77.6 kg)   BMI 29.35 kg/m   Specialty Comments:  No specialty comments available.  PMFS History: Patient Active Problem List   Diagnosis Date Noted  . Genital herpes simplex 04/03/2020  . Migraine without status migrainosus, not intractable 04/03/2020  . Peripheral neuropathy 04/03/2020  . Labral tear of shoulder, right, initial encounter 02/12/2020  . Primary osteoarthritis, right shoulder 02/12/2020  . Pseudogout of left knee 11/06/2019  .  Pseudogout of right knee 11/06/2019  . COPD with acute exacerbation ([place]) 09/25/2019  . AKI (acute kidney injury) ([place]) 09/25/2019  . Chronic back pain 09/25/2019  . Acute on chronic kidney failure ([place]) 09/25/2019  . Acute respiratory failure with hypoxemia ([place]) 09/25/2019  . Numbness 08/30/2019  . Confusion 08/30/2019  . Chondrocalcinosis due to dicalcium phosphate crystals, of the knee, right 04/12/2019  . Chronic pain of both shoulders 01/30/2019  . Bilateral primary osteoarthritis of knee 01/08/2019  . Exposure to communicable disease 05/09/2018  . Flying phobia 05/09/2018  . H/O bee sting allergy 05/09/2018  . Mixed hyperlipidemia 05/09/2018  . Allergic contact dermatitis 04/14/2018  . Preop general physical exam 12/14/2017  . Mass of right hand 07/14/2017  . Cat scratch of forearm, right, initial encounter 06/30/2017  . Family history of breast cancer 06/22/2017  . History of ovarian cancer 06/22/2017  . Bipolar affective disorder in remission ([place]) 04/13/2017  . Allergic dermatitis due to poison ivy 03/10/2017  . Myofascial pain 03/10/2017  . Acute bronchitis due to Mycoplasma pneumoniae 02/17/2017  . Anemia 02/17/2017  . Cervical radiculopathy 02/17/2017  . Chronic pain syndrome 02/17/2017  . Complete tear of anterior cruciate ligament of knee 02/17/2017  . Dental infection 02/17/2017  . Epidermal cyst 02/17/2017  . Failed back surgical syndrome 02/17/2017  . Hypoglycemia 02/17/2017  . Lesion of nipple 02/17/2017  . DJD (degenerative joint disease) of knee 02/17/2017  . Petechiae 02/17/2017  . Impingement syndrome of right shoulder 02/17/2017  . Seizures ([place]) 02/17/2017  . Anxiety and depression 01/19/2017  . Cervical disc disease 01/19/2017  . Chronic bronchitis ([place]) 01/19/2017  . Contusion of right great toe without damage to nail 01/19/2017  . Migraine aura, persistent, intractable 01/19/2017  . Viral upper respiratory tract infection 01/19/2017  . Chronic  diarrhea 01/14/2017  . Bipolar 2 disorder, major depressive episode ([place]) 08/26/2016  . Tobacco use disorder, severe, dependence 08/26/2016  . Benzodiazepine dependence ([place]) 08/25/2016  . Pain in right knee 07/02/2016  . Dry eye syndrome 06/17/2016  . Episode of recurrent major depressive disorder ([place]) 06/17/2016  . Essential hypertension 06/17/2016  . GERD without esophagitis 06/17/2016  . Hepatitis C 06/17/2016  . Herniated cervical disc 06/17/2016  . Herpes simplex vulvovaginitis 06/17/2016  . Hypothyroidism (acquired) 06/17/2016  . Insomnia 06/17/2016  . Malaise and fatigue 06/17/2016  . Polycystic kidney disease 06/17/2016  . Primary osteoarthritis involving multiple joints 06/17/2016  . Allergic rhinitis 08/07/2014  . Anxiety 08/07/2014  . Cough 08/07/2014  . Other amnesia 08/07/2014  . Other fatigue 08/07/2014  . Severe episode of recurrent major depressive disorder, with psychotic features ([place]) 08/07/2014  . Tobacco use 08/07/2014   Past Medical History:  Diagnosis  Date  . Acid reflux   . Cataract   . Chronic kidney disease    Stage 3  . Colitis   . Compound fracture    L1  . COPD (chronic obstructive pulmonary disease) ([place])   . Hepatitis C   . Hypertension   . Neck fracture ([place])   . Neuromuscular disorder (HCC)    neuropathy  . Numbness and tingling   . Ovarian cancer ([place])   . Thyroid disease   . Tremor     Family History  Problem Relation Age of Onset  . Other Mother        unsure of history  . Diabetes Father   . Hypertension Father   . Hypertension Sister   . Hypertension Brother     Past Surgical History:  Procedure Laterality Date  . CHOLECYSTECTOMY    . FOOT SURGERY     ORIF for fracture in the right foot  . IR VERTEBROPLASTY LUMBAR BX INC UNI/BIL INC/INJECT/IMAGING  08/21/2019  . KNEE ARTHROSCOPY    . NECK SURGERY     cervical fusion  . SHOULDER ARTHROSCOPY    . VAGINAL HYSTERECTOMY     Social History   Occupational History  .  Occupation: Disabled  Tobacco Use  . Smoking status: Current Every Day Smoker    Packs/day: 0.25    Years: 37.00    Pack years: 9.25    Types: Cigarettes  . Smokeless tobacco: Never Used  Vaping Use  . Vaping Use: Never used  Substance and Sexual Activity  . Alcohol use: Yes    Comment: RARELY  . Drug use: Never  . Sexual activity: Not Currently

## 2020-08-14 ENCOUNTER — Encounter: Payer: Self-pay | Admitting: Orthopaedic Surgery

## 2020-08-14 ENCOUNTER — Ambulatory Visit (INDEPENDENT_AMBULATORY_CARE_PROVIDER_SITE_OTHER): Payer: Medicare Other | Admitting: Orthopaedic Surgery

## 2020-08-14 ENCOUNTER — Other Ambulatory Visit: Payer: Self-pay

## 2020-08-14 ENCOUNTER — Ambulatory Visit: Payer: Self-pay

## 2020-08-14 VITALS — Ht 64.0 in | Wt 171.0 lb

## 2020-08-14 DIAGNOSIS — G894 Chronic pain syndrome: Secondary | ICD-10-CM | POA: Diagnosis not present

## 2020-08-14 DIAGNOSIS — M25561 Pain in right knee: Secondary | ICD-10-CM

## 2020-08-14 DIAGNOSIS — M25511 Pain in right shoulder: Secondary | ICD-10-CM

## 2020-08-14 DIAGNOSIS — M25512 Pain in left shoulder: Secondary | ICD-10-CM | POA: Diagnosis not present

## 2020-08-14 DIAGNOSIS — G8929 Other chronic pain: Secondary | ICD-10-CM

## 2020-08-14 DIAGNOSIS — M11261 Other chondrocalcinosis, right knee: Secondary | ICD-10-CM | POA: Diagnosis not present

## 2020-08-14 MED ORDER — LIDOCAINE HCL 1 % IJ SOLN
2.0000 mL | INTRAMUSCULAR | Status: AC | PRN
  Administered 2020-08-14: 2 mL

## 2020-08-14 MED ORDER — BUPIVACAINE HCL 0.5 % IJ SOLN
2.0000 mL | INTRAMUSCULAR | Status: AC | PRN
  Administered 2020-08-14: 2 mL via INTRA_ARTICULAR

## 2020-08-14 NOTE — Progress Notes (Signed)
Office Visit Note   Patient: Raven Hansen           Date of Birth: 01/23/1961           MRN: [id number] Visit Date: 08/14/2020              Requested by: Debbrah Alar, NP [address] [place],  [place] [address] PCP: Debbrah Alar, NP   Assessment & Plan: Visit Diagnoses:  1. Acute pain of right knee   2. Pseudogout of right knee   3. Chronic pain of both shoulders   4. Chronic pain syndrome     Plan: History of osteoarthritis right shoulder with some temporary relief with cortisone.  Will reinject today.  Very complicated medical history with multiple joint problems.  She has been followed at the [facility] clinic for chronic back problems and plans to go back in November.  She recently fell and sustained a minimally displaced left distal fibula fracture seen by Dr. Sharol Given.  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  She would like to have a knee scooter.  I will write the prescription.  When she injured her ankle she also fell directly on her right knee.  She does have evidence of chondrocalcinosis but no acute changes.  She is also now involved in a pain clinic for all of her medicines.  I will inject her right shoulder glenohumeral joint today and plan to see her back as needed.  Follow-Up Instructions: Return if symptoms worsen or fail to improve.   Orders:  Orders Placed This Encounter  Procedures  . Large Joint Inj: R glenohumeral  . XR KNEE 3 VIEW RIGHT   No orders of the defined types were placed in this encounter.     Procedures: Large Joint Inj: R glenohumeral on 08/14/2020 2:01 PM Indications: pain and diagnostic evaluation Details: 25 G 1.5 in needle, posterior approach  Arthrogram: No  Medications: 2 mL lidocaine 1 %; 2 mL bupivacaine 0.5 %  2 mL betamethasone Consent was given by the patient. Immediately prior to procedure a time out was called to verify the correct patient, procedure, equipment, support staff and  site/side marked as required. Patient was prepped and draped in the usual sterile fashion.       Clinical Data: No additional findings.   Subjective: Chief Complaint  Patient presents with  . Right Shoulder - Follow-up  . Left Shoulder - Follow-up  . Right Knee - Pain  Patient presents today for bilateral shoulder pain. She is wanting to have both shoulders injected since it has been over three months. She also states that her right knee is experiencing increased pain since a fall on 08/11/2020. She to the ED following the fall and was diagnosed with a left ankle fracture. She saw Junie Panning in the office yesterday and was placed into a tall walking boot. She is nonweightbearing on her left leg. She takes pain medicine as prescribed by pain management. She is wanting a script for a riding knee scooter. Has a follow-up appointment at the [facility] clinic in the next several weeks for her chronic back problems HPI  Review of Systems   Objective: Vital Signs: Ht 5\' 4"  (1.626 m)   Wt 171 lb (77.6 kg)   BMI 29.35 kg/m   Physical Exam Constitutional:      Appearance: She is well-developed.  Eyes:     Pupils: Pupils are equal, round, and reactive to light.  Pulmonary:  Effort: Pulmonary effort is normal.  Skin:    General: Skin is warm and dry.  Neurological:     Mental Status: She is alert and oriented to person, place, and time.  Psychiatric:        Behavior: Behavior normal.     Ortho Exam evaluated in a wheelchair.  Left lower extremity in an equalizer boot.  Right knee was recently injured.  She had full extension and flexion.  There was no opening with varus valgus stress and no effusion.  Mild medial lateral joint pain.  Does have painful movement of her right shoulder related to the glenohumeral osteoarthritis.  No crepitation.  Good grip and good release Specialty Comments:  No specialty comments available.  Imaging: XR KNEE 3 VIEW RIGHT  Result Date:  08/14/2020 Films of the right knee were obtained in 3 projections standing.  Patient had a recent injury where she fell on the knee.  I did not see any acute changes or evidence of fracture.  There is chondrocalcinosis with calcification of both menisci and some degenerative changes in all 3 compartments    PMFS History: Patient Active Problem List   Diagnosis Date Noted  . Genital herpes simplex 04/03/2020  . Migraine without status migrainosus, not intractable 04/03/2020  . Peripheral neuropathy 04/03/2020  . Labral tear of shoulder, right, initial encounter 02/12/2020  . Primary osteoarthritis, right shoulder 02/12/2020  . Pseudogout of left knee 11/06/2019  . Pseudogout of right knee 11/06/2019  . COPD with acute exacerbation ([place]) 09/25/2019  . AKI (acute kidney injury) ([place]) 09/25/2019  . Chronic back pain 09/25/2019  . Acute on chronic kidney failure ([place]) 09/25/2019  . Acute respiratory failure with hypoxemia ([place]) 09/25/2019  . Numbness 08/30/2019  . Confusion 08/30/2019  . Chondrocalcinosis due to dicalcium phosphate crystals, of the knee, right 04/12/2019  . Chronic pain of both shoulders 01/30/2019  . Bilateral primary osteoarthritis of knee 01/08/2019  . Exposure to communicable disease 05/09/2018  . Flying phobia 05/09/2018  . H/O bee sting allergy 05/09/2018  . Mixed hyperlipidemia 05/09/2018  . Allergic contact dermatitis 04/14/2018  . Preop general physical exam 12/14/2017  . Mass of right hand 07/14/2017  . Cat scratch of forearm, right, initial encounter 06/30/2017  . Family history of breast cancer 06/22/2017  . History of ovarian cancer 06/22/2017  . Bipolar affective disorder in remission ([place]) 04/13/2017  . Allergic dermatitis due to poison ivy 03/10/2017  . Myofascial pain 03/10/2017  . Acute bronchitis due to Mycoplasma pneumoniae 02/17/2017  . Anemia 02/17/2017  . Cervical radiculopathy 02/17/2017  . Chronic pain syndrome 02/17/2017  . Complete tear  of anterior cruciate ligament of knee 02/17/2017  . Dental infection 02/17/2017  . Epidermal cyst 02/17/2017  . Failed back surgical syndrome 02/17/2017  . Hypoglycemia 02/17/2017  . Lesion of nipple 02/17/2017  . DJD (degenerative joint disease) of knee 02/17/2017  . Petechiae 02/17/2017  . Impingement syndrome of right shoulder 02/17/2017  . Seizures ([place]) 02/17/2017  . Anxiety and depression 01/19/2017  . Cervical disc disease 01/19/2017  . Chronic bronchitis ([place]) 01/19/2017  . Contusion of right great toe without damage to nail 01/19/2017  . Migraine aura, persistent, intractable 01/19/2017  . Viral upper respiratory tract infection 01/19/2017  . Chronic diarrhea 01/14/2017  . Bipolar 2 disorder, major depressive episode ([place]) 08/26/2016  . Tobacco use disorder, severe, dependence 08/26/2016  . Benzodiazepine dependence ([place]) 08/25/2016  . Pain in right knee 07/02/2016  . Dry eye syndrome 06/17/2016  .  Episode of recurrent major depressive disorder ([place]) 06/17/2016  . Essential hypertension 06/17/2016  . GERD without esophagitis 06/17/2016  . Hepatitis C 06/17/2016  . Herniated cervical disc 06/17/2016  . Herpes simplex vulvovaginitis 06/17/2016  . Hypothyroidism (acquired) 06/17/2016  . Insomnia 06/17/2016  . Malaise and fatigue 06/17/2016  . Polycystic kidney disease 06/17/2016  . Primary osteoarthritis involving multiple joints 06/17/2016  . Allergic rhinitis 08/07/2014  . Anxiety 08/07/2014  . Cough 08/07/2014  . Other amnesia 08/07/2014  . Other fatigue 08/07/2014  . Severe episode of recurrent major depressive disorder, with psychotic features ([place]) 08/07/2014  . Tobacco use 08/07/2014   Past Medical History:  Diagnosis Date  . Acid reflux   . Cataract   . Chronic kidney disease    Stage 3  . Colitis   . Compound fracture    L1  . COPD (chronic obstructive pulmonary disease) ([place])   . Hepatitis C   . Hypertension   . Neck fracture ([place])   .  Neuromuscular disorder (HCC)    neuropathy  . Numbness and tingling   . Ovarian cancer ([place])   . Thyroid disease   . Tremor     Family History  Problem Relation Age of Onset  . Other Mother        unsure of history  . Diabetes Father   . Hypertension Father   . Hypertension Sister   . Hypertension Brother     Past Surgical History:  Procedure Laterality Date  . CHOLECYSTECTOMY    . FOOT SURGERY     ORIF for fracture in the right foot  . IR VERTEBROPLASTY LUMBAR BX INC UNI/BIL INC/INJECT/IMAGING  08/21/2019  . KNEE ARTHROSCOPY    . NECK SURGERY     cervical fusion  . SHOULDER ARTHROSCOPY    . VAGINAL HYSTERECTOMY     Social History   Occupational History  . Occupation: Disabled  Tobacco Use  . Smoking status: Current Every Day Smoker    Packs/day: 0.25    Years: 37.00    Pack years: 9.25    Types: Cigarettes  . Smokeless tobacco: Never Used  Vaping Use  . Vaping Use: Never used  Substance and Sexual Activity  . Alcohol use: Yes    Comment: RARELY  . Drug use: Never  . Sexual activity: Not Currently

## 2020-08-19 ENCOUNTER — Telehealth: Payer: Self-pay | Admitting: Physical Medicine and Rehabilitation

## 2020-08-19 DIAGNOSIS — M4722 Other spondylosis with radiculopathy, cervical region: Secondary | ICD-10-CM

## 2020-08-19 DIAGNOSIS — M5126 Other intervertebral disc displacement, lumbar region: Secondary | ICD-10-CM

## 2020-08-19 NOTE — Telephone Encounter (Signed)
ok 

## 2020-08-19 NOTE — Telephone Encounter (Signed)
Patient had bilateral L5 TF on 04/03/20. Ok to repeat if helped, same problem/side, and no new injury?

## 2020-08-19 NOTE — Telephone Encounter (Signed)
Patient called needing an appointment with Dr Ernestina Patches for her back. The number to contact patient is [phone]

## 2020-08-20 ENCOUNTER — Telehealth: Payer: Self-pay

## 2020-08-20 NOTE — Telephone Encounter (Signed)
Patient needs a note for the airport stating she has a plate and 4 screws in her neck plate 8 screws in right foot and cement in her back she has a appointment tomorrow and stated she will pick it up at her appointment. Call [phone]

## 2020-08-20 NOTE — Addendum Note (Signed)
Addended by: Sherre Scarlet B on: 08/20/2020 04:29 PM   Modules accepted: Orders

## 2020-08-20 NOTE — Telephone Encounter (Signed)
Patient is returning your call. She can be reached at [phone]

## 2020-08-20 NOTE — Telephone Encounter (Signed)
Pt not req Auth#  Called pt and lvm #1

## 2020-08-20 NOTE — Telephone Encounter (Signed)
I just put in an order for this to be done at [facility]. Bilateral L5 TF. Patient wants ASAP.

## 2020-08-21 ENCOUNTER — Ambulatory Visit (INDEPENDENT_AMBULATORY_CARE_PROVIDER_SITE_OTHER): Payer: Medicare Other | Admitting: Orthopedic Surgery

## 2020-08-21 ENCOUNTER — Ambulatory Visit: Payer: Self-pay

## 2020-08-21 ENCOUNTER — Encounter: Payer: Self-pay | Admitting: Radiology

## 2020-08-21 ENCOUNTER — Encounter: Payer: Self-pay | Admitting: Orthopedic Surgery

## 2020-08-21 DIAGNOSIS — S8262XA Displaced fracture of lateral malleolus of left fibula, initial encounter for closed fracture: Secondary | ICD-10-CM

## 2020-08-21 DIAGNOSIS — M25572 Pain in left ankle and joints of left foot: Secondary | ICD-10-CM

## 2020-08-21 MED ORDER — PREDNISONE 10 MG PO TABS: 10.0000 mg | ORAL_TABLET | Freq: Every day | ORAL | 0 refills | Status: AC

## 2020-08-21 NOTE — Telephone Encounter (Signed)
I called and advised that the only note I can give her is for her neck, I advised that we do not have any xrays on file for her right foot/ankle, we only have her left foot/ankle.  She states that would be fine. Notre written and put it down stairs for her to pick up.

## 2020-08-21 NOTE — Progress Notes (Signed)
Office Visit Note   Patient: Raven Hansen           Date of Birth: 1961/09/27           MRN: [id number] Visit Date: 08/21/2020              Requested by: Debbrah Alar, NP [address] [place],  [place] [address] PCP: Vonita Moss, NP  Chief Complaint  Patient presents with  . Left Ankle - Follow-up      HPI: Patient is a 59 year old woman who was seen for initial evaluation for a displaced Weber B left fibular fracture she is currently in a fracture boot she has a kneeling scooter.  Patient states she also has poison ivy that she needs steroids for.  Patient states she has tried topical cortisone cream she has tried topical calamine lotion and she states she is taking 20 mg of prednisone a day in the past.  Patient states she has a history of hepatitis C and kidney failure.  Patient states she has had cervical spine anterior discectomy and fusion in 2010 she is status post treatment with vertebroplasty for lumbar compression fractures.  Patient states she is leaving in 1 week to have spinal nerve ablation.  She states that she will be in [place].  Assessment & Plan: Visit Diagnoses:  1. Pain in left ankle and joints of left foot   2. Closed displaced fracture of lateral malleolus of left fibula, initial encounter     Plan: Discussed with the patient that I would recommend proceeding with open reduction internal fixation of the ankle fracture at this time however would not proceed with surgery several days before her leaving out of town for a prolonged trip with prolonged immobilization with increased risks of DVT and other potential complications.  Patient states that she will leave for her spinal ablation nerve surgery and will follow up after this is performed.  We will give her a new better fitting fracture boot recommended minimizing weightbearing and will obtain three-view radiographs of the left ankle at follow-up to see if surgical intervention is  warranted.  A prescription for prednisone 10 mg with breakfast was provided for her poison ivy.  Follow-Up Instructions: Return in about 2 weeks (around 09/04/2020).   Ortho Exam  Patient is alert, oriented, no adenopathy, well-dressed, normal affect, normal respiratory effort. Examination patient is ambulating in a fracture boot full weightbearing coming into the office.  She has swelling laterally no open wounds no cellulitis she has palpable pulses.  She has very small patches of what is most likely a contact dermatitis.  Imaging: XR Ankle Complete Left  Result Date: 08/21/2020 Three-view radiographs of the left ankle shows a displaced Weber B fibular fracture with a noncongruent mortise there is no dislocation of the joint.  Displacement approximately 2 mm.  No images are attached to the encounter.  Labs: Lab Results  Component Value Date   HGBA1C 5.4 08/30/2019   REPTSTATUS 09/26/2019 FINAL 09/26/2019   GRAMSTAIN  08/21/2019    ABUNDANT WBC PRESENT,BOTH PMN AND MONONUCLEAR NO ORGANISMS SEEN    CULT  09/25/2019    NO GROWTH 5 DAYS Performed at [facility], [place] [address] [place], [place] [address]    CULT  09/25/2019    NO GROWTH 5 DAYS Performed at [facility] [address] [place], [place] [address]      Lab Results  Component Value Date   ALBUMIN 3.7  09/26/2019    Lab Results  Component Value Date   MG 3.0 (H) 09/25/2019   No results found for: VD25OH  No results found for: PREALBUMIN CBC EXTENDED Latest Ref Rng & Units 09/27/2019 09/26/2019 09/25/2019  WBC 4.0 - 10.5 K/uL 12.0(H) 11.5(H) 16.5(H)  RBC 3.87 - 5.11 MIL/uL 3.38(L) 3.48(L) 3.72(L)  HGB 12.0 - 15.0 g/dL 11.4(L) 11.8(L) 12.7  HCT 36 - 46 % 34.1(L) 35.0(L) 36.9  PLT 150 - 400 K/uL 162 178 175  NEUTROABS 1.7 - 7.7 K/uL 7.9(H) 9.2(H) -  LYMPHSABS 0.7 - 4.0 K/uL 2.6 1.4 -     There is no height or weight on file to calculate BMI.  Orders:  Orders Placed This Encounter    Procedures  . XR Ankle Complete Left   Meds ordered this encounter  Medications  . predniSONE (DELTASONE) 10 MG tablet    Sig: Take 1 tablet (10 mg total) by mouth daily with breakfast.    Dispense:  30 tablet    Refill:  0     Procedures: No procedures performed  Clinical Data: No additional findings.  ROS:  All other systems negative, except as noted in the HPI. Review of Systems  Objective: Vital Signs: There were no vitals taken for this visit.  Specialty Comments:  No specialty comments available.  PMFS History: Patient Active Problem List   Diagnosis Date Noted  . Genital herpes simplex 04/03/2020  . Migraine without status migrainosus, not intractable 04/03/2020  . Peripheral neuropathy 04/03/2020  . Labral tear of shoulder, right, initial encounter 02/12/2020  . Primary osteoarthritis, right shoulder 02/12/2020  . Pseudogout of left knee 11/06/2019  . Pseudogout of right knee 11/06/2019  . COPD with acute exacerbation ([place]) 09/25/2019  . AKI (acute kidney injury) ([place]) 09/25/2019  . Chronic back pain 09/25/2019  . Acute on chronic kidney failure ([place]) 09/25/2019  . Acute respiratory failure with hypoxemia ([place]) 09/25/2019  . Numbness 08/30/2019  . Confusion 08/30/2019  . Chondrocalcinosis due to dicalcium phosphate crystals, of the knee, right 04/12/2019  . Chronic pain of both shoulders 01/30/2019  . Bilateral primary osteoarthritis of knee 01/08/2019  . Exposure to communicable disease 05/09/2018  . Flying phobia 05/09/2018  . H/O bee sting allergy 05/09/2018  . Mixed hyperlipidemia 05/09/2018  . Allergic contact dermatitis 04/14/2018  . Preop general physical exam 12/14/2017  . Mass of right hand 07/14/2017  . Cat scratch of forearm, right, initial encounter 06/30/2017  . Family history of breast cancer 06/22/2017  . History of ovarian cancer 06/22/2017  . Bipolar affective disorder in remission ([place]) 04/13/2017  . Allergic dermatitis due to  poison ivy 03/10/2017  . Myofascial pain 03/10/2017  . Acute bronchitis due to Mycoplasma pneumoniae 02/17/2017  . Anemia 02/17/2017  . Cervical radiculopathy 02/17/2017  . Chronic pain syndrome 02/17/2017  . Complete tear of anterior cruciate ligament of knee 02/17/2017  . Dental infection 02/17/2017  . Epidermal cyst 02/17/2017  . Failed back surgical syndrome 02/17/2017  . Hypoglycemia 02/17/2017  . Lesion of nipple 02/17/2017  . DJD (degenerative joint disease) of knee 02/17/2017  . Petechiae 02/17/2017  . Impingement syndrome of right shoulder 02/17/2017  . Seizures ([place]) 02/17/2017  . Anxiety and depression 01/19/2017  . Cervical disc disease 01/19/2017  . Chronic bronchitis ([place]) 01/19/2017  . Contusion of right great toe without damage to nail 01/19/2017  . Migraine aura, persistent, intractable 01/19/2017  . Viral upper respiratory tract infection 01/19/2017  . Chronic diarrhea 01/14/2017  .  Bipolar 2 disorder, major depressive episode ([place]) 08/26/2016  . Tobacco use disorder, severe, dependence 08/26/2016  . Benzodiazepine dependence ([place]) 08/25/2016  . Pain in right knee 07/02/2016  . Dry eye syndrome 06/17/2016  . Episode of recurrent major depressive disorder ([place]) 06/17/2016  . Essential hypertension 06/17/2016  . GERD without esophagitis 06/17/2016  . Hepatitis C 06/17/2016  . Herniated cervical disc 06/17/2016  . Herpes simplex vulvovaginitis 06/17/2016  . Hypothyroidism (acquired) 06/17/2016  . Insomnia 06/17/2016  . Malaise and fatigue 06/17/2016  . Polycystic kidney disease 06/17/2016  . Primary osteoarthritis involving multiple joints 06/17/2016  . Allergic rhinitis 08/07/2014  . Anxiety 08/07/2014  . Cough 08/07/2014  . Other amnesia 08/07/2014  . Other fatigue 08/07/2014  . Severe episode of recurrent major depressive disorder, with psychotic features ([place]) 08/07/2014  . Tobacco use 08/07/2014   Past Medical History:  Diagnosis Date  . Acid  reflux   . Cataract   . Chronic kidney disease    Stage 3  . Colitis   . Compound fracture    L1  . COPD (chronic obstructive pulmonary disease) ([place])   . Hepatitis C   . Hypertension   . Neck fracture ([place])   . Neuromuscular disorder (HCC)    neuropathy  . Numbness and tingling   . Ovarian cancer ([place])   . Thyroid disease   . Tremor     Family History  Problem Relation Age of Onset  . Other Mother        unsure of history  . Diabetes Father   . Hypertension Father   . Hypertension Sister   . Hypertension Brother     Past Surgical History:  Procedure Laterality Date  . CHOLECYSTECTOMY    . FOOT SURGERY     ORIF for fracture in the right foot  . IR VERTEBROPLASTY LUMBAR BX INC UNI/BIL INC/INJECT/IMAGING  08/21/2019  . KNEE ARTHROSCOPY    . NECK SURGERY     cervical fusion  . SHOULDER ARTHROSCOPY    . VAGINAL HYSTERECTOMY     Social History   Occupational History  . Occupation: Disabled  Tobacco Use  . Smoking status: Current Every Day Smoker    Packs/day: 0.25    Years: 37.00    Pack years: 9.25    Types: Cigarettes  . Smokeless tobacco: Never Used  Vaping Use  . Vaping Use: Never used  Substance and Sexual Activity  . Alcohol use: Yes    Comment: RARELY  . Drug use: Never  . Sexual activity: Not Currently

## 2020-08-21 NOTE — Telephone Encounter (Signed)
Called Cathy at [facility] imaging and she will contact pt to schedule

## 2020-08-22 ENCOUNTER — Other Ambulatory Visit: Payer: Self-pay | Admitting: Physical Medicine and Rehabilitation

## 2020-08-22 ENCOUNTER — Other Ambulatory Visit: Payer: Self-pay

## 2020-08-22 ENCOUNTER — Ambulatory Visit
Admission: RE | Admit: 2020-08-22 | Discharge: 2020-08-22 | Disposition: A | Discharge status: Home / Self Care | Payer: Medicare Other | Source: Ambulatory Visit | Attending: Physical Medicine and Rehabilitation | Admitting: Physical Medicine and Rehabilitation

## 2020-08-22 DIAGNOSIS — M4722 Other spondylosis with radiculopathy, cervical region: Secondary | ICD-10-CM

## 2020-08-22 DIAGNOSIS — M5126 Other intervertebral disc displacement, lumbar region: Secondary | ICD-10-CM

## 2020-08-22 MED ORDER — IOPAMIDOL (ISOVUE-M 200) INJECTION 41%
1.0000 mL | Freq: Once | INTRAMUSCULAR | Status: AC
  Administered 2020-08-22: 1 mL via EPIDURAL

## 2020-08-22 MED ORDER — METHYLPREDNISOLONE ACETATE 40 MG/ML INJ SUSP (RADIOLOG
120.0000 mg | Freq: Once | INTRAMUSCULAR | Status: AC
  Administered 2020-08-22: 120 mg via EPIDURAL

## 2020-08-22 NOTE — Discharge Instructions (Signed)

## 2020-08-26 ENCOUNTER — Ambulatory Visit: Payer: Medicare Other | Admitting: Orthopedic Surgery

## 2020-09-01 ENCOUNTER — Telehealth: Payer: Self-pay

## 2020-09-01 NOTE — Telephone Encounter (Signed)
Patient called in wanting to speak with dr newton regarding pain she is having . Says she broke her neck , also broke her ankle recently .. says her recent injection in cyatic nerve didn't help .

## 2020-09-01 NOTE — Telephone Encounter (Signed)
Patient had most recent injection at [facility]. Please advise.

## 2020-09-02 NOTE — Telephone Encounter (Signed)
Scheduled for ov with Dr. Louanne Skye.

## 2020-09-02 NOTE — Telephone Encounter (Signed)
Best bet is to follow with [facility] or [facility] clinic or they can try pain clinic.

## 2020-09-03 ENCOUNTER — Telehealth: Payer: Self-pay

## 2020-09-03 NOTE — Telephone Encounter (Signed)
I called and lmom that we are seeing her for her back and that she will need to call us back with a more detailed message as to what she is needing to be injected, if it is her back then he will not be doing as she would be sent to Dr. Ernestina Patches for that and they would have to call her to schedule once she has been seen.

## 2020-09-03 NOTE — Telephone Encounter (Signed)
Patient called she has a appointment with Dr.Nitka tomorrow she wants to know if he is going to give her a injection, she stated to leave a detailed message. Call [phone]

## 2020-09-04 ENCOUNTER — Ambulatory Visit: Payer: Medicare Other | Admitting: Specialist

## 2020-09-11 ENCOUNTER — Ambulatory Visit: Payer: Medicare Other | Admitting: Orthopedic Surgery

## 2020-09-22 ENCOUNTER — Encounter (HOSPITAL_COMMUNITY): Payer: Self-pay | Admitting: Emergency Medicine

## 2020-09-22 ENCOUNTER — Other Ambulatory Visit: Payer: Self-pay

## 2020-09-22 ENCOUNTER — Emergency Department (HOSPITAL_COMMUNITY)
Admission: EM | Admit: 2020-09-22 | Discharge: 2020-09-23 | Disposition: A | Discharge status: Home / Self Care | Payer: Medicare Other | Attending: Emergency Medicine | Admitting: Emergency Medicine

## 2020-09-22 ENCOUNTER — Emergency Department (HOSPITAL_COMMUNITY): Payer: Medicare Other

## 2020-09-22 DIAGNOSIS — J449 Chronic obstructive pulmonary disease, unspecified: Secondary | ICD-10-CM | POA: Diagnosis not present

## 2020-09-22 DIAGNOSIS — M7989 Other specified soft tissue disorders: Secondary | ICD-10-CM

## 2020-09-22 DIAGNOSIS — E039 Hypothyroidism, unspecified: Secondary | ICD-10-CM | POA: Diagnosis not present

## 2020-09-22 DIAGNOSIS — Z79899 Other long term (current) drug therapy: Secondary | ICD-10-CM | POA: Insufficient documentation

## 2020-09-22 DIAGNOSIS — F17210 Nicotine dependence, cigarettes, uncomplicated: Secondary | ICD-10-CM | POA: Diagnosis not present

## 2020-09-22 DIAGNOSIS — N183 Chronic kidney disease, stage 3 unspecified: Secondary | ICD-10-CM | POA: Insufficient documentation

## 2020-09-22 DIAGNOSIS — Z96659 Presence of unspecified artificial knee joint: Secondary | ICD-10-CM | POA: Insufficient documentation

## 2020-09-22 DIAGNOSIS — M79605 Pain in left leg: Secondary | ICD-10-CM | POA: Diagnosis present

## 2020-09-22 DIAGNOSIS — I129 Hypertensive chronic kidney disease with stage 1 through stage 4 chronic kidney disease, or unspecified chronic kidney disease: Secondary | ICD-10-CM | POA: Diagnosis not present

## 2020-09-22 DIAGNOSIS — L03116 Cellulitis of left lower limb: Secondary | ICD-10-CM | POA: Diagnosis not present

## 2020-09-22 LAB — CBC WITH DIFFERENTIAL/PLATELET
Abs Immature Granulocytes: 0.11 10*3/uL — ABNORMAL HIGH (ref 0.00–0.07)
Basophils Absolute: 0.1 10*3/uL (ref 0.0–0.1)
Basophils Relative: 1 %
Eosinophils Absolute: 0.4 10*3/uL (ref 0.0–0.5)
Eosinophils Relative: 5 %
HCT: 33.3 % — ABNORMAL LOW (ref 36.0–46.0)
Hemoglobin: 10.3 g/dL — ABNORMAL LOW (ref 12.0–15.0)
Immature Granulocytes: 1 %
Lymphocytes Relative: 26 %
Lymphs Abs: 2.3 10*3/uL (ref 0.7–4.0)
MCH: 32.7 pg (ref 26.0–34.0)
MCHC: 30.9 g/dL (ref 30.0–36.0)
MCV: 105.7 fL — ABNORMAL HIGH (ref 80.0–100.0)
Monocytes Absolute: 0.8 10*3/uL (ref 0.1–1.0)
Monocytes Relative: 10 %
Neutro Abs: 5 10*3/uL (ref 1.7–7.7)
Neutrophils Relative %: 57 %
Platelets: 238 10*3/uL (ref 150–400)
RBC: 3.15 MIL/uL — ABNORMAL LOW (ref 3.87–5.11)
RDW: 14.4 % (ref 11.5–15.5)
WBC: 8.7 10*3/uL (ref 4.0–10.5)
nRBC: 0.5 % — ABNORMAL HIGH (ref 0.0–0.2)

## 2020-09-22 LAB — BASIC METABOLIC PANEL
Anion gap: 13 (ref 5–15)
BUN: 14 mg/dL (ref 6–20)
CO2: 24 mmol/L (ref 22–32)
Calcium: 9.5 mg/dL (ref 8.9–10.3)
Chloride: 99 mmol/L (ref 98–111)
Creatinine, Ser: 1.47 mg/dL — ABNORMAL HIGH (ref 0.44–1.00)
GFR, Estimated: 41 mL/min — ABNORMAL LOW (ref >60)
Glucose, Bld: 99 mg/dL (ref 70–99)
Potassium: 3.6 mmol/L (ref 3.5–5.1)
Sodium: 136 mmol/L (ref 135–145)

## 2020-09-22 MED ORDER — MORPHINE SULFATE (PF) 4 MG/ML IV SOLN
4.0000 mg | Freq: Once | INTRAVENOUS | Status: AC
  Administered 2020-09-23: 4 mg via INTRAVENOUS
  Filled 2020-09-22: qty 1

## 2020-09-22 MED ORDER — ONDANSETRON HCL 4 MG/2ML IJ SOLN
4.0000 mg | Freq: Once | INTRAMUSCULAR | Status: AC
  Administered 2020-09-23: 4 mg via INTRAVENOUS
  Filled 2020-09-22: qty 2

## 2020-09-22 NOTE — ED Triage Notes (Signed)
Pt states she had surgery 11/16 on her left ankle on [facility] now her leg is very swollen and bruised and has a sign of infection.

## 2020-09-22 NOTE — ED Provider Notes (Signed)
[facility] EMERGENCY DEPARTMENT Provider Note   CSN: [id number] Arrival date & time: 09/22/20  1809     History Chief Complaint  Patient presents with  . Post-op Problem    Raven Hansen is a 59 y.o. female.  HPI     This 59 year old female with a history of chronic kidney disease, COPD, hepatitis C, hypertension who presents with left leg pain and discoloration.  Patient had internal fixation of a lateral malleolus fracture on 11/16 by Dr. Jaci Standard at [facility].  She states that since that time she has been using a small scooter.  She has noted sores on her anterior shin secondary to the scooter.  Additionally over the last 2 to 3 days she has had increasing redness, erythema, pain and "bruising" over the medial aspect of the left thigh extending from the posterior knee into the groin.  She also states she has had redness on the contralateral thigh.  She is not had any fevers.  No chest pain shortness of breath.  She is taking 2 full aspirin daily.  She rates her pain at 9 out of 10.  She is taking oxycodone with minimal relief.  Past Medical History:  Diagnosis Date  . Acid reflux   . Cataract   . Chronic kidney disease    Stage 3  . Colitis   . Compound fracture    L1  . COPD (chronic obstructive pulmonary disease) ([place])   . Hepatitis C   . Hypertension   . Neck fracture ([place])   . Neuromuscular disorder (HCC)    neuropathy  . Numbness and tingling   . Ovarian cancer ([place])   . Thyroid disease   . Tremor     Patient Active Problem List   Diagnosis Date Noted  . Genital herpes simplex 04/03/2020  . Migraine without status migrainosus, not intractable 04/03/2020  . Peripheral neuropathy 04/03/2020  . Labral tear of shoulder, right, initial encounter 02/12/2020  . Primary osteoarthritis, right shoulder 02/12/2020  . Pseudogout of left knee 11/06/2019  . Pseudogout of right knee 11/06/2019  . COPD with acute exacerbation ([place]) 09/25/2019  .  AKI (acute kidney injury) ([place]) 09/25/2019  . Chronic back pain 09/25/2019  . Acute on chronic kidney failure ([place]) 09/25/2019  . Acute respiratory failure with hypoxemia ([place]) 09/25/2019  . Numbness 08/30/2019  . Confusion 08/30/2019  . Chondrocalcinosis due to dicalcium phosphate crystals, of the knee, right 04/12/2019  . Chronic pain of both shoulders 01/30/2019  . Bilateral primary osteoarthritis of knee 01/08/2019  . Exposure to communicable disease 05/09/2018  . Flying phobia 05/09/2018  . H/O bee sting allergy 05/09/2018  . Mixed hyperlipidemia 05/09/2018  . Allergic contact dermatitis 04/14/2018  . Preop general physical exam 12/14/2017  . Mass of right hand 07/14/2017  . Cat scratch of forearm, right, initial encounter 06/30/2017  . Family history of breast cancer 06/22/2017  . History of ovarian cancer 06/22/2017  . Bipolar affective disorder in remission ([place]) 04/13/2017  . Allergic dermatitis due to poison ivy 03/10/2017  . Myofascial pain 03/10/2017  . Acute bronchitis due to Mycoplasma pneumoniae 02/17/2017  . Anemia 02/17/2017  . Cervical radiculopathy 02/17/2017  . Chronic pain syndrome 02/17/2017  . Complete tear of anterior cruciate ligament of knee 02/17/2017  . Dental infection 02/17/2017  . Epidermal cyst 02/17/2017  . Failed back surgical syndrome 02/17/2017  . Hypoglycemia 02/17/2017  . Lesion of nipple 02/17/2017  . DJD (degenerative joint disease) of knee 02/17/2017  .  Petechiae 02/17/2017  . Impingement syndrome of right shoulder 02/17/2017  . Seizures ([place]) 02/17/2017  . Anxiety and depression 01/19/2017  . Cervical disc disease 01/19/2017  . Chronic bronchitis ([place]) 01/19/2017  . Contusion of right great toe without damage to nail 01/19/2017  . Migraine aura, persistent, intractable 01/19/2017  . Viral upper respiratory tract infection 01/19/2017  . Chronic diarrhea 01/14/2017  . Bipolar 2 disorder, major depressive episode ([place]) 08/26/2016  .  Tobacco use disorder, severe, dependence 08/26/2016  . Benzodiazepine dependence ([place]) 08/25/2016  . Pain in right knee 07/02/2016  . Dry eye syndrome 06/17/2016  . Episode of recurrent major depressive disorder ([place]) 06/17/2016  . Essential hypertension 06/17/2016  . GERD without esophagitis 06/17/2016  . Hepatitis C 06/17/2016  . Herniated cervical disc 06/17/2016  . Herpes simplex vulvovaginitis 06/17/2016  . Hypothyroidism (acquired) 06/17/2016  . Insomnia 06/17/2016  . Malaise and fatigue 06/17/2016  . Polycystic kidney disease 06/17/2016  . Primary osteoarthritis involving multiple joints 06/17/2016  . Allergic rhinitis 08/07/2014  . Anxiety 08/07/2014  . Cough 08/07/2014  . Other amnesia 08/07/2014  . Other fatigue 08/07/2014  . Severe episode of recurrent major depressive disorder, with psychotic features ([place]) 08/07/2014  . Tobacco use 08/07/2014    Past Surgical History:  Procedure Laterality Date  . CHOLECYSTECTOMY    . FOOT SURGERY     ORIF for fracture in the right foot  . IR VERTEBROPLASTY LUMBAR BX INC UNI/BIL INC/INJECT/IMAGING  08/21/2019  . KNEE ARTHROSCOPY    . NECK SURGERY     cervical fusion  . SHOULDER ARTHROSCOPY    . VAGINAL HYSTERECTOMY       OB History   No obstetric history on file.     Family History  Problem Relation Age of Onset  . Other Mother        unsure of history  . Diabetes Father   . Hypertension Father   . Hypertension Sister   . Hypertension Brother     Social History   Tobacco Use  . Smoking status: Current Every Day Smoker    Packs/day: 0.25    Years: 37.00    Pack years: 9.25    Types: Cigarettes  . Smokeless tobacco: Never Used  Vaping Use  . Vaping Use: Never used  Substance Use Topics  . Alcohol use: Yes    Comment: RARELY  . Drug use: Never    Home Medications Prior to Admission medications   Medication Sig Start Date End Date Taking? Authorizing Provider  albuterol (VENTOLIN HFA) 108 (90 Base)  MCG/ACT inhaler Inhale 2 puffs into the lungs every 4 (four) hours as needed for wheezing. 05/13/20   Debbrah Alar, NP  amLODipine (NORVASC) 10 MG tablet Take 1 tablet (10 mg total) by mouth daily. 03/31/20   Debbrah Alar, NP  atenolol (TENORMIN) 100 MG tablet Take 1 tablet (100 mg total) by mouth daily. 03/31/20   Debbrah Alar, NP  carisoprodol (SOMA) 350 MG tablet Take 350 mg by mouth 3 (three) times daily as needed. 09/18/19   [provider]  cholestyramine (QUESTRAN) 4 g packet Take 4 g by mouth 4 (four) times daily.  07/13/18   [provider]  doxycycline (VIBRAMYCIN) 100 MG capsule Take 1 capsule (100 mg total) by mouth 2 (two) times daily. 09/23/20   Burnham Trost, Barbette Hair, MD  escitalopram (LEXAPRO) 20 MG tablet Take 1 tablet (20 mg total) by mouth daily. 03/31/20   Debbrah Alar, NP  fluconazole (DIFLUCAN) 150 MG  tablet Take 1 tablet by mouth for vaginal yeast infection, you may repeat in 3 days if needed. 03/31/20   Debbrah Alar, NP  fluticasone (FLONASE) 50 MCG/ACT nasal spray Place 1 spray into both nostrils 2 (two) times daily.  08/18/18   [provider]  levothyroxine (SYNTHROID) 125 MCG tablet Take 1 tablet (125 mcg total) by mouth daily. 03/31/20   Debbrah Alar, NP  methocarbamol (ROBAXIN) 500 MG tablet Take 1 tablet (500 mg total) by mouth 4 (four) times daily as needed for muscle spasms. 03/31/20   Debbrah Alar, NP  nystatin (MYCOSTATIN) 100000 UNIT/ML suspension Take 5 mLs (500,000 Units total) by mouth 4 (four) times daily. 56ml swish 03/31/20   Debbrah Alar, NP  omeprazole (PRILOSEC) 40 MG capsule Take 1 capsule (40 mg total) by mouth daily. 05/13/20   Debbrah Alar, NP  oxycodone (OXY-IR) 5 MG capsule Take 1 capsule (5 mg total) by mouth every 4 (four) hours as needed. 11/12/19   Jessy Oto, MD  oxyCODONE-acetaminophen (PERCOCET/ROXICET) 5-325 MG tablet Take 1 tablet by mouth 2 (two) times daily as needed for  severe pain. 08/13/20   Suzan Slick, NP  predniSONE (DELTASONE) 10 MG tablet Take 1 tablet (10 mg total) by mouth daily with breakfast. 08/21/20   Newt Minion, MD  Spacer/Aero-Holding Chambers (AEROCHAMBER MV) inhaler Use as instructed 09/27/19   Nita Sells, MD  SUMAtriptan (IMITREX) 100 MG tablet Take 1 tablet (100 mg total) by mouth every 2 (two) hours as needed for migraine. 03/31/20   Debbrah Alar, NP  tiotropium (SPIRIVA HANDIHALER) 18 MCG inhalation capsule Place 1 capsule (18 mcg total) into inhaler and inhale daily. 09/27/19 09/26/20  Nita Sells, MD  valACYclovir (VALTREX) 1000 MG tablet Take 1 tablet (1,000 mg total) by mouth as needed (for outbreak). 03/31/20   Debbrah Alar, NP    Allergies    Bee venom, Meperidine hcl, and Meperidine  Review of Systems   Review of Systems  Constitutional: Negative for fever.  Cardiovascular: Positive for leg swelling. Negative for chest pain.  Gastrointestinal: Negative for abdominal pain, nausea and vomiting.  Genitourinary: Negative for dysuria.  Skin: Positive for color change.  All other systems reviewed and are negative.   Physical Exam Updated Vital Signs BP 120/88 (BP Location: Right Arm)   Pulse 88   Temp 98.5 F (36.9 C)   Resp 19   Ht 1.626 m (5\' 4" )   Wt 77.6 kg   SpO2 99%   BMI 29.37 kg/m   Physical Exam Vitals and nursing note reviewed.  Constitutional:      Appearance: She is well-developed. She is not ill-appearing.  HENT:     Head: Normocephalic and atraumatic.     Nose: Nose normal.     Mouth/Throat:     Mouth: Mucous membranes are moist.  Eyes:     Pupils: Pupils are equal, round, and reactive to light.  Cardiovascular:     Rate and Rhythm: Normal rate and regular rhythm.     Heart sounds: Normal heart sounds.  Pulmonary:     Effort: Pulmonary effort is normal. No respiratory distress.     Breath sounds: No wheezing.  Abdominal:     Palpations: Abdomen is soft.      Tenderness: There is no abdominal tenderness.  Musculoskeletal:     Cervical back: Neck supple.     Comments: Cast noted over the distal aspect of the left lower extremity, there is tenderness and bruising over the anterior  aspect of the shin with abrasions and sores noted just adjacent to the cast and one just inferior to the knee, tenderness palpation, no crepitus Good capillary refill of the toes  Skin:    General: Skin is warm and dry.     Comments: Bruising discoloration noted over the posterior medial aspect of the left leg just adjacent to the knee, erythema and redness extends proximally and becomes more blanching approximately, well demarcated  Neurological:     Mental Status: She is alert and oriented to person, place, and time.  Psychiatric:        Mood and Affect: Mood normal.         ED Results / Procedures / Treatments   Labs (all labs ordered are listed, but only abnormal results are displayed) Labs Reviewed  CBC WITH DIFFERENTIAL/PLATELET - Abnormal; Notable for the following components:      Result Value   RBC 3.15 (*)    Hemoglobin 10.3 (*)    HCT 33.3 (*)    MCV 105.7 (*)    nRBC 0.5 (*)    Abs Immature Granulocytes 0.11 (*)    All other components within normal limits  BASIC METABOLIC PANEL - Abnormal; Notable for the following components:   Creatinine, Ser 1.47 (*)    GFR, Estimated 41 (*)    All other components within normal limits  PROTIME-INR    EKG None  Radiology DG Tibia/Fibula Left  Result Date: 09/23/2020 CLINICAL DATA:  Left leg swelling EXAM: LEFT TIBIA AND FIBULA - 2 VIEW COMPARISON:  None. FINDINGS: There is soft tissue edema above the level of the ankle cast. There is distal left fibula screw and plate fixation hardware without abnormal lucency. No acute osseous abnormality. There is bicompartmental femorotibial chondrocalcinosis. IMPRESSION: 1. Soft tissue edema above the level of the ankle cast. No acute osseous abnormality of the  left tibia or fibula. 2. Bicompartmental femorotibial chondrocalcinosis, likely CPPD. Electronically Signed   By: Ulyses Jarred M.D.   On: 09/23/2020 00:04    Procedures Procedures (including critical care time)  Medications Ordered in ED Medications  morphine 4 MG/ML injection 4 mg (4 mg Intravenous Given 09/23/20 0038)  ondansetron (ZOFRAN) injection 4 mg (4 mg Intravenous Given 09/23/20 0037)  enoxaparin (LOVENOX) injection 77.5 mg (77.5 mg Subcutaneous Given 09/23/20 0119)  doxycycline (VIBRA-TABS) tablet 100 mg (100 mg Oral Given 09/23/20 0144)  diazepam (VALIUM) tablet 5 mg (5 mg Oral Given 09/23/20 0144)    ED Course  I have reviewed the triage vital signs and the nursing notes.  Pertinent labs & imaging results that were available during my care of the patient were reviewed by me and considered in my medical decision making (see chart for details).    MDM Rules/Calculators/A&P                          Patient presents with left leg pain and swelling.  Also noted to have sores from her scooter.  She is overall nontoxic.  She is afebrile.  Vital signs are reassuring.  She has 2 sores over her left anterior lower leg likely related to the scooter.  Interestingly she has what appears to be bruising over the medial posterior aspect of the knee extending proximally it becomes more erythematous and warm suggestive of cellulitis.  She certainly has skin findings which would put her at risk for cellulitis.  She is not systemically ill appearing.  Lab work obtained.  No significant leukocytosis or metabolic derangement.  PT/INR is reassuring.  Iterations include but not limited to, bruising, cellulitis, DVT.  No signs or symptoms of deep space skin infection or necrotizing infection.  I did obtain x-rays of her operative site as she currently has a hard cast on.  Hardware appears in good position and there is no gas noted in the tissue.  We will leave hard cast in place.  Patient was given 1  dose of Lovenox.  She will return later today for a DVT study.  Given the erythema proximally and breakage of the skin from the scooter, will treat for cellulitis as well.  Given that she is not systemically ill appearing, feels that this can be done as an outpatient.  She does need to follow-up closely with her orthopedist.  Patient is agreeable plan  After history, exam, and medical workup I feel the patient has been appropriately medically screened and is safe for discharge home. Pertinent diagnoses were discussed with the patient. Patient was given return precautions.  Final Clinical Impression(s) / ED Diagnoses Final diagnoses:  Leg swelling  Cellulitis of left lower extremity    Rx / DC Orders ED Discharge Orders         Ordered    doxycycline (VIBRAMYCIN) 100 MG capsule  2 times daily        09/23/20 0123    LE VENOUS        09/23/20 0123           Merryl Hacker, MD 09/23/20 0157

## 2020-09-23 ENCOUNTER — Emergency Department (HOSPITAL_COMMUNITY): Admission: RE | Admit: 2020-09-23 | Payer: Medicare Other | Source: Ambulatory Visit

## 2020-09-23 DIAGNOSIS — L03116 Cellulitis of left lower limb: Secondary | ICD-10-CM | POA: Diagnosis not present

## 2020-09-23 LAB — PROTIME-INR
INR: 1 (ref 0.8–1.2)
Prothrombin Time: 12.7 seconds (ref 11.4–15.2)

## 2020-09-23 MED ORDER — ENOXAPARIN SODIUM 80 MG/0.8ML ~~LOC~~ SOLN
1.0000 mg/kg | Freq: Once | SUBCUTANEOUS | Status: AC
  Administered 2020-09-23: 77.5 mg via SUBCUTANEOUS
  Filled 2020-09-23: qty 0.78

## 2020-09-23 MED ORDER — DOXYCYCLINE HYCLATE 100 MG PO TABS
100.0000 mg | ORAL_TABLET | Freq: Once | ORAL | Status: AC
  Administered 2020-09-23: 100 mg via ORAL
  Filled 2020-09-23: qty 1

## 2020-09-23 MED ORDER — DIAZEPAM 5 MG PO TABS
5.0000 mg | ORAL_TABLET | Freq: Once | ORAL | Status: AC
  Administered 2020-09-23: 5 mg via ORAL
  Filled 2020-09-23: qty 1

## 2020-09-23 MED ORDER — DOXYCYCLINE HYCLATE 100 MG PO CAPS: 100.0000 mg | ORAL_CAPSULE | Freq: Two times a day (BID) | ORAL | 0 refills | Status: AC

## 2020-09-23 NOTE — Discharge Instructions (Addendum)
You were seen today for leg swelling and pain.  You will be treated with antibiotics for possible skin infection related to source from your scooter.  There is not appear to be a deep infection.  You need to return tomorrow for ultrasound imaging to rule out a blood clot.

## 2020-09-24 ENCOUNTER — Ambulatory Visit (HOSPITAL_COMMUNITY)
Admission: RE | Admit: 2020-09-24 | Discharge: 2020-09-24 | Disposition: A | Discharge status: Home / Self Care | Payer: Medicare Other | Source: Ambulatory Visit | Attending: Emergency Medicine | Admitting: Emergency Medicine

## 2020-09-24 ENCOUNTER — Other Ambulatory Visit: Payer: Self-pay

## 2020-09-24 DIAGNOSIS — M7989 Other specified soft tissue disorders: Secondary | ICD-10-CM | POA: Diagnosis not present

## 2020-09-24 NOTE — Progress Notes (Signed)
Lower extremity venous has been completed.   Preliminary results in CV Proc.   Abram Sander 09/24/2020 8:44 AM

## 2020-10-15 ENCOUNTER — Other Ambulatory Visit: Payer: Self-pay | Admitting: Family

## 2020-10-16 ENCOUNTER — Ambulatory Visit: Payer: Medicare Other | Admitting: Orthopaedic Surgery

## 2020-10-16 ENCOUNTER — Ambulatory Visit (INDEPENDENT_AMBULATORY_CARE_PROVIDER_SITE_OTHER): Payer: Medicare Other | Admitting: Orthopaedic Surgery

## 2020-10-16 ENCOUNTER — Other Ambulatory Visit: Payer: Self-pay

## 2020-10-16 ENCOUNTER — Encounter: Payer: Self-pay | Admitting: Orthopaedic Surgery

## 2020-10-16 VITALS — Ht 64.0 in | Wt 171.0 lb

## 2020-10-16 DIAGNOSIS — M170 Bilateral primary osteoarthritis of knee: Secondary | ICD-10-CM

## 2020-10-16 DIAGNOSIS — M7541 Impingement syndrome of right shoulder: Secondary | ICD-10-CM | POA: Diagnosis not present

## 2020-10-16 MED ORDER — BUPIVACAINE HCL 0.5 % IJ SOLN
2.0000 mL | INTRAMUSCULAR | Status: AC | PRN
  Administered 2020-10-16: 2 mL via INTRA_ARTICULAR

## 2020-10-16 MED ORDER — LIDOCAINE HCL 1 % IJ SOLN
2.0000 mL | INTRAMUSCULAR | Status: AC | PRN
  Administered 2020-10-16: 2 mL

## 2020-10-16 NOTE — Progress Notes (Signed)
Office Visit Note   Patient: Raven Hansen           Date of Birth: April 18, 1961           MRN: [id number] Visit Date: 10/16/2020              Requested by: Vonita Moss, NP [place],  [place] [id number] PCP: Vonita Moss, NP   Assessment & Plan: Visit Diagnoses:  1. Bilateral primary osteoarthritis of knee   2. Impingement syndrome of right shoulder     Plan: Raven Hansen been seen on multiple occasions with impingement syndrome of the right shoulder and with some arthritic changes and also bilateral knee osteoarthritis.  She is recently had open reduction internal fixation of a left ankle fracture treated at [facility] and has been using a rolling walker with aggravation of her shoulder pain and her arthritic left knee.  She was "begging" for injections of both areas.  This was performed without difficulty  Follow-Up Instructions: Return if symptoms worsen or fail to improve.   Orders:  Orders Placed This Encounter  Procedures  . Large Joint Inj: L glenohumeral  . Large Joint Inj: L knee   No orders of the defined types were placed in this encounter.     Procedures: Large Joint Inj: L glenohumeral on 10/16/2020 4:20 PM Details: 25 G 1.5 in needle, posterior approach  Arthrogram: No  Medications: 2 mL lidocaine 1 %; 2 mL bupivacaine 0.5 %  2 mL betamethasone injected with Xylocaine and [place]aine Procedure, treatment alternatives, risks and benefits explained, specific risks discussed. Consent was given by the patient. Immediately prior to procedure a time out was called to verify the correct patient, procedure, equipment, support staff and site/side marked as required. Patient was prepped and draped in the usual sterile fashion.   Large Joint Inj: L knee on 10/16/2020 4:22 PM Indications: pain and diagnostic evaluation Details: 25 G 1.5 in needle, anteromedial approach  Arthrogram: No  Medications: 2 mL lidocaine 1 %; 2 mL bupivacaine 0.5 %  2 mL  betamethasone injected the left knee with [place]aine and Xylocaine Procedure, treatment alternatives, risks and benefits explained, specific risks discussed. Consent was given by the patient. Patient was prepped and draped in the usual sterile fashion.       Clinical Data: No additional findings.   Subjective: Chief Complaint  Patient presents with  . Left Knee - Pain  . Right Shoulder - Pain  Patient presents today for her left knee and right shoulder. She is wanting to have both areas injected with cortisone today. She had her left knee injected on 07/23/2020, and her right shoulder injected last on 08/14/2020.  Prior films of her right shoulder demonstrate inferior humeral head spurring consistent with arthritis.  She does have films of both of her knees consistent with arthritis HPI  Review of Systems   Objective: Vital Signs: Ht 5\' 4"  (1.626 m)   Wt 171 lb (77.6 kg)   BMI 29.35 kg/m   Physical Exam Constitutional:      Appearance: She is well-developed and well-nourished.  HENT:     Mouth/Throat:     Mouth: Oropharynx is clear and moist.  Eyes:     Extraocular Movements: EOM normal.     Pupils: Pupils are equal, round, and reactive to light.  Pulmonary:     Effort: Pulmonary effort is normal.  Skin:    General: Skin is warm and dry.  Neurological:  Mental Status: She is alert and oriented to person, place, and time.  Psychiatric:        Mood and Affect: Mood and affect normal.        Behavior: Behavior normal.     Ortho Exam awake alert and oriented x3.  Ambulates with the use of a rolling walker after recent left ankle surgery.  Left knee was not effused.  Multiple incisions from prior surgeries.  Positive anterior drawer sign little opening with varus valgus stress and mostly medial joint pain.  Full extension of flexed over 100 degrees.  Some crepitation with motion of her right shoulder.  Some pain along the glenohumeral joint.  Skin intact.  Very minimal  discomfort at the subacromial area.  Mild loss of external rotation consistent with her arthritis.  Able to place her arm overhead with some discomfort  Specialty Comments:  No specialty comments available.  Imaging: No results found.   PMFS History: Patient Active Problem List   Diagnosis Date Noted  . Genital herpes simplex 04/03/2020  . Migraine without status migrainosus, not intractable 04/03/2020  . Peripheral neuropathy 04/03/2020  . Labral tear of shoulder, right, initial encounter 02/12/2020  . Primary osteoarthritis, right shoulder 02/12/2020  . Pseudogout of left knee 11/06/2019  . Pseudogout of right knee 11/06/2019  . COPD with acute exacerbation ([place]) 09/25/2019  . AKI (acute kidney injury) ([place]) 09/25/2019  . Chronic back pain 09/25/2019  . Acute on chronic kidney failure ([place]) 09/25/2019  . Acute respiratory failure with hypoxemia ([place]) 09/25/2019  . Numbness 08/30/2019  . Confusion 08/30/2019  . Chondrocalcinosis due to dicalcium phosphate crystals, of the knee, right 04/12/2019  . Chronic pain of both shoulders 01/30/2019  . Bilateral primary osteoarthritis of knee 01/08/2019  . Exposure to communicable disease 05/09/2018  . Flying phobia 05/09/2018  . H/O bee sting allergy 05/09/2018  . Mixed hyperlipidemia 05/09/2018  . Allergic contact dermatitis 04/14/2018  . Preop general physical exam 12/14/2017  . Mass of right hand 07/14/2017  . Cat scratch of forearm, right, initial encounter 06/30/2017  . Family history of breast cancer 06/22/2017  . History of ovarian cancer 06/22/2017  . Bipolar affective disorder in remission ([place]) 04/13/2017  . Allergic dermatitis due to poison ivy 03/10/2017  . Myofascial pain 03/10/2017  . Acute bronchitis due to Mycoplasma pneumoniae 02/17/2017  . Anemia 02/17/2017  . Cervical radiculopathy 02/17/2017  . Chronic pain syndrome 02/17/2017  . Complete tear of anterior cruciate ligament of knee 02/17/2017  . Dental  infection 02/17/2017  . Epidermal cyst 02/17/2017  . Failed back surgical syndrome 02/17/2017  . Hypoglycemia 02/17/2017  . Lesion of nipple 02/17/2017  . DJD (degenerative joint disease) of knee 02/17/2017  . Petechiae 02/17/2017  . Impingement syndrome of right shoulder 02/17/2017  . Seizures ([place]) 02/17/2017  . Anxiety and depression 01/19/2017  . Cervical disc disease 01/19/2017  . Chronic bronchitis ([place]) 01/19/2017  . Contusion of right great toe without damage to nail 01/19/2017  . Migraine aura, persistent, intractable 01/19/2017  . Viral upper respiratory tract infection 01/19/2017  . Chronic diarrhea 01/14/2017  . Bipolar 2 disorder, major depressive episode ([place]) 08/26/2016  . Tobacco use disorder, severe, dependence 08/26/2016  . Benzodiazepine dependence ([place]) 08/25/2016  . Pain in right knee 07/02/2016  . Dry eye syndrome 06/17/2016  . Episode of recurrent major depressive disorder ([place]) 06/17/2016  . Essential hypertension 06/17/2016  . GERD without esophagitis 06/17/2016  . Hepatitis C 06/17/2016  . Herniated cervical disc  06/17/2016  . Herpes simplex vulvovaginitis 06/17/2016  . Hypothyroidism (acquired) 06/17/2016  . Insomnia 06/17/2016  . Malaise and fatigue 06/17/2016  . Polycystic kidney disease 06/17/2016  . Primary osteoarthritis involving multiple joints 06/17/2016  . Allergic rhinitis 08/07/2014  . Anxiety 08/07/2014  . Cough 08/07/2014  . Other amnesia 08/07/2014  . Other fatigue 08/07/2014  . Severe episode of recurrent major depressive disorder, with psychotic features ([place]) 08/07/2014  . Tobacco use 08/07/2014   Past Medical History:  Diagnosis Date  . Acid reflux   . Cataract   . Chronic kidney disease    Stage 3  . Colitis   . Compound fracture    L1  . COPD (chronic obstructive pulmonary disease) ([place])   . Hepatitis C   . Hypertension   . Neck fracture ([place])   . Neuromuscular disorder (HCC)    neuropathy  . Numbness and tingling    . Ovarian cancer ([place])   . Thyroid disease   . Tremor     Family History  Problem Relation Age of Onset  . Other Mother        unsure of history  . Diabetes Father   . Hypertension Father   . Hypertension Sister   . Hypertension Brother     Past Surgical History:  Procedure Laterality Date  . CHOLECYSTECTOMY    . FOOT SURGERY     ORIF for fracture in the right foot  . IR VERTEBROPLASTY LUMBAR BX INC UNI/BIL INC/INJECT/IMAGING  08/21/2019  . KNEE ARTHROSCOPY    . NECK SURGERY     cervical fusion  . SHOULDER ARTHROSCOPY    . VAGINAL HYSTERECTOMY     Social History   Occupational History  . Occupation: Disabled  Tobacco Use  . Smoking status: Current Every Day Smoker    Packs/day: 0.25    Years: 37.00    Pack years: 9.25    Types: Cigarettes  . Smokeless tobacco: Never Used  Vaping Use  . Vaping Use: Never used  Substance and Sexual Activity  . Alcohol use: Yes    Comment: RARELY  . Drug use: Never  . Sexual activity: Not Currently

## 2020-11-02 ENCOUNTER — Other Ambulatory Visit: Payer: Self-pay | Admitting: Family

## 2020-11-27 ENCOUNTER — Ambulatory Visit: Payer: Medicare Other | Admitting: Orthopedic Surgery

## 2020-12-02 ENCOUNTER — Other Ambulatory Visit: Payer: Self-pay | Admitting: Nurse Practitioner

## 2020-12-02 DIAGNOSIS — Z1231 Encounter for screening mammogram for malignant neoplasm of breast: Secondary | ICD-10-CM

## 2020-12-04 ENCOUNTER — Ambulatory Visit (INDEPENDENT_AMBULATORY_CARE_PROVIDER_SITE_OTHER): Payer: Medicare Other

## 2020-12-04 ENCOUNTER — Encounter: Payer: Self-pay | Admitting: Orthopedic Surgery

## 2020-12-04 ENCOUNTER — Ambulatory Visit (INDEPENDENT_AMBULATORY_CARE_PROVIDER_SITE_OTHER): Payer: Medicare Other | Admitting: Physician Assistant

## 2020-12-04 DIAGNOSIS — S8262XA Displaced fracture of lateral malleolus of left fibula, initial encounter for closed fracture: Secondary | ICD-10-CM

## 2020-12-04 NOTE — Progress Notes (Signed)
Office Visit Note   Patient: Raven Hansen           Date of Birth: [birth year]           MRN: [id number] Visit Date: 12/04/2020              Requested by: Vonita Moss, NP [address] [place],  [place] [address] PCP: Vonita Moss, NP  Chief Complaint  Patient presents with  . Left Ankle - Follow-up    Closed displaced fx lateral malleolus had sugery at [facility] 08/2020      HPI: Patient is not quite 2 months status post open reduction internal fixation of a left lateral malleolus fracture.  This was done at [facility].  She follows up with [place] today because she does not want to drive all the way there.her biggest concern is that recently she has begun to feel the screws and wonders if this is a problem.  She still has some pain but is been working in her yard in a high top boot and ankle sleeve  Assessment & Plan: Visit Diagnoses:  1. Closed displaced fracture of lateral malleolus of left fibula, initial encounter     Plan: I have offered her physical therapy but she is declined this.  She may follow-up with [place] as needed or back with her surgeons at [facility].  I explained to her that her ankle actually looks good now that her swelling has receded the hardware will be more prominent stressed the importance of strengthening  Follow-Up Instructions: No follow-ups on file.   Ortho Exam  Patient is alert, oriented, no adenopathy, well-dressed, normal affect, normal respiratory effort. Well-healed surgical incision no erythema minimal swelling hardware is palpable but not particularly painful.  She does have some pain in the ankle with dorsiflexion  Imaging: No results found. No images are attached to the encounter.  Labs: Lab Results  Component Value Date   HGBA1C 5.4 08/30/2019   REPTSTATUS 09/26/2019 FINAL 09/26/2019   GRAMSTAIN  08/21/2019    ABUNDANT WBC PRESENT,BOTH PMN AND MONONUCLEAR NO ORGANISMS SEEN    CULT  09/25/2019    NO GROWTH 5 DAYS Performed at [facility], [place] [address], [place], [place] [address]    CULT  09/25/2019    NO GROWTH 5 DAYS Performed at [facility] [address] [place], [place] [address]      Lab Results  Component Value Date   ALBUMIN 3.7 09/26/2019    Lab Results  Component Value Date   MG 3.0 (H) 09/25/2019   No results found for: VD25OH  No results found for: PREALBUMIN CBC EXTENDED Latest Ref Rng & Units 09/22/2020 09/27/2019 09/26/2019  WBC 4.0 - 10.5 K/uL 8.7 12.0(H) 11.5(H)  RBC 3.87 - 5.11 MIL/uL 3.15(L) 3.38(L) 3.48(L)  HGB 12.0 - 15.0 g/dL 10.3(L) 11.4(L) 11.8(L)  HCT 36.0 - 46.0 % 33.3(L) 34.1(L) 35.0(L)  PLT 150 - 400 K/uL 238 162 178  NEUTROABS 1.7 - 7.7 K/uL 5.0 7.9(H) 9.2(H)  LYMPHSABS 0.7 - 4.0 K/uL 2.3 2.6 1.4     There is no height or weight on file to calculate BMI.  Orders:  Orders Placed This Encounter  Procedures  . XR Ankle Complete Left   No orders of the defined types were placed in this encounter.    Procedures: No procedures performed  Clinical Data: No additional findings.  ROS:  All other systems negative, except as noted in the HPI. Review of Systems  Objective: Vital Signs: There were no vitals taken for this visit.  Specialty Comments:  No specialty comments available.  PMFS History: Patient Active Problem List   Diagnosis Date Noted  . Genital herpes simplex 04/03/2020  . Migraine without status migrainosus, not intractable 04/03/2020  . Peripheral neuropathy 04/03/2020  . Labral tear of shoulder, right, initial encounter 02/12/2020  . Primary osteoarthritis, right shoulder 02/12/2020  . Pseudogout of left knee 11/06/2019  . Pseudogout of right knee 11/06/2019  . COPD with acute exacerbation ([place]) 09/25/2019  . AKI (acute kidney injury) ([place]) 09/25/2019  . Chronic back pain 09/25/2019  . Acute on chronic kidney failure ([place]) 09/25/2019  . Acute respiratory failure with hypoxemia ([place]) 09/25/2019  . Numbness 08/30/2019  .  Confusion 08/30/2019  . Chondrocalcinosis due to dicalcium phosphate crystals, of the knee, right 04/12/2019  . Chronic pain of both shoulders 01/30/2019  . Bilateral primary osteoarthritis of knee 01/08/2019  . Exposure to communicable disease 05/09/2018  . Flying phobia 05/09/2018  . H/O bee sting allergy 05/09/2018  . Mixed hyperlipidemia 05/09/2018  . Allergic contact dermatitis 04/14/2018  . Preop general physical exam 12/14/2017  . Mass of right hand 07/14/2017  . Cat scratch of forearm, right, initial encounter 06/30/2017  . Family history of breast cancer 06/22/2017  . History of ovarian cancer 06/22/2017  . Bipolar affective disorder in remission ([place]) 04/13/2017  . Allergic dermatitis due to poison ivy 03/10/2017  . Myofascial pain 03/10/2017  . Acute bronchitis due to Mycoplasma pneumoniae 02/17/2017  . Anemia 02/17/2017  . Cervical radiculopathy 02/17/2017  . Chronic pain syndrome 02/17/2017  . Complete tear of anterior cruciate ligament of knee 02/17/2017  . Dental infection 02/17/2017  . Epidermal cyst 02/17/2017  . Failed back surgical syndrome 02/17/2017  . Hypoglycemia 02/17/2017  . Lesion of nipple 02/17/2017  . DJD (degenerative joint disease) of knee 02/17/2017  . Petechiae 02/17/2017  . Impingement syndrome of right shoulder 02/17/2017  . Seizures ([place]) 02/17/2017  . Anxiety and depression 01/19/2017  . Cervical disc disease 01/19/2017  . Chronic bronchitis ([place]) 01/19/2017  . Contusion of right great toe without damage to nail 01/19/2017  . Migraine aura, persistent, intractable 01/19/2017  . Viral upper respiratory tract infection 01/19/2017  . Chronic diarrhea 01/14/2017  . Bipolar 2 disorder, major depressive episode ([place]) 08/26/2016  . Tobacco use disorder, severe, dependence 08/26/2016  . Benzodiazepine dependence ([place]) 08/25/2016  . Pain in right knee 07/02/2016  . Dry eye syndrome 06/17/2016  . Episode of recurrent major depressive disorder  ([place]) 06/17/2016  . Essential hypertension 06/17/2016  . GERD without esophagitis 06/17/2016  . Hepatitis C 06/17/2016  . Herniated cervical disc 06/17/2016  . Herpes simplex vulvovaginitis 06/17/2016  . Hypothyroidism (acquired) 06/17/2016  . Insomnia 06/17/2016  . Malaise and fatigue 06/17/2016  . Polycystic kidney disease 06/17/2016  . Primary osteoarthritis involving multiple joints 06/17/2016  . Allergic rhinitis 08/07/2014  . Anxiety 08/07/2014  . Cough 08/07/2014  . Other amnesia 08/07/2014  . Other fatigue 08/07/2014  . Severe episode of recurrent major depressive disorder, with psychotic features ([place]) 08/07/2014  . Tobacco use 08/07/2014   Past Medical History:  Diagnosis Date  . Acid reflux   . Cataract   . Chronic kidney disease    Stage 3  . Colitis   . Compound fracture    L1  . COPD (chronic obstructive pulmonary disease) ([place])   . Hepatitis C   . Hypertension   . Neck fracture ([place])   .  Neuromuscular disorder (HCC)    neuropathy  . Numbness and tingling   . Ovarian cancer ([place])   . Thyroid disease   . Tremor     Family History  Problem Relation Age of Onset  . Other Mother        unsure of history  . Diabetes Father   . Hypertension Father   . Hypertension Sister   . Hypertension Brother     Past Surgical History:  Procedure Laterality Date  . CHOLECYSTECTOMY    . FOOT SURGERY     ORIF for fracture in the right foot  . IR VERTEBROPLASTY LUMBAR BX INC UNI/BIL INC/INJECT/IMAGING  08/21/2019  . KNEE ARTHROSCOPY    . NECK SURGERY     cervical fusion  . SHOULDER ARTHROSCOPY    . VAGINAL HYSTERECTOMY     Social History   Occupational History  . Occupation: Disabled  Tobacco Use  . Smoking status: Current Every Day Smoker    Packs/day: 0.25    Years: 37.00    Pack years: 9.25    Types: Cigarettes  . Smokeless tobacco: Never Used  Vaping Use  . Vaping Use: Never used  Substance and Sexual Activity  . Alcohol use: Yes    Comment:  RARELY  . Drug use: Never  . Sexual activity: Not Currently

## 2020-12-05 ENCOUNTER — Other Ambulatory Visit: Payer: Self-pay | Admitting: Nurse Practitioner

## 2020-12-05 DIAGNOSIS — N6310 Unspecified lump in the right breast, unspecified quadrant: Secondary | ICD-10-CM

## 2020-12-05 DIAGNOSIS — N6320 Unspecified lump in the left breast, unspecified quadrant: Secondary | ICD-10-CM

## 2020-12-10 ENCOUNTER — Ambulatory Visit: Payer: Medicare Other | Admitting: Orthopaedic Surgery

## 2020-12-15 ENCOUNTER — Telehealth: Payer: Self-pay | Admitting: Physical Medicine and Rehabilitation

## 2020-12-15 NOTE — Telephone Encounter (Signed)
Patient's last injection was bilateral L3 TF 04/03/20. Please advise.

## 2020-12-15 NOTE — Telephone Encounter (Addendum)
Pt called stating she has gotten spine injections before and would like another injection. Pt did state she went to [facility] back in August but she has all the records from that trip. Pt also mentioned an oblation   [phone] * Would like a VM left if she doesn't answer

## 2020-12-15 NOTE — Telephone Encounter (Signed)
Last note from [place] states double diagnostic blocks 80% and recommended RFA, she asked them about opioids and they said not recommended. I would able to do the RFA with valium, 2 visits. IF anthing different can f/up with Dr. Louanne Skye

## 2020-12-16 ENCOUNTER — Telehealth: Payer: Self-pay | Admitting: Specialist

## 2020-12-16 NOTE — Telephone Encounter (Signed)
Added to cancellation list 

## 2020-12-16 NOTE — Telephone Encounter (Signed)
Left message #1

## 2020-12-16 NOTE — Telephone Encounter (Signed)
Pt called returning Courtney's call, she states she is available for a CB anytime   [phone]

## 2020-12-16 NOTE — Telephone Encounter (Signed)
Spoke with patient she asked if she could be added to the cancellation list for sooner appointment.   [phone]

## 2020-12-16 NOTE — Telephone Encounter (Signed)
Patient states that she needs a shot for her sciatic nerve pain. I transferred her to the front desk to schedule a follow up with Dr. Louanne Skye.

## 2020-12-16 NOTE — Telephone Encounter (Signed)
Yes ok, RFA to be done at [facility], if esi needed then [place] ok.

## 2020-12-18 ENCOUNTER — Other Ambulatory Visit: Payer: Self-pay

## 2020-12-18 ENCOUNTER — Ambulatory Visit (INDEPENDENT_AMBULATORY_CARE_PROVIDER_SITE_OTHER): Payer: Medicare Other | Admitting: Orthopaedic Surgery

## 2020-12-18 ENCOUNTER — Encounter: Payer: Self-pay | Admitting: Orthopaedic Surgery

## 2020-12-18 ENCOUNTER — Telehealth: Payer: Self-pay | Admitting: Specialist

## 2020-12-18 ENCOUNTER — Telehealth: Payer: Self-pay | Admitting: Orthopaedic Surgery

## 2020-12-18 VITALS — Ht 64.0 in | Wt 171.0 lb

## 2020-12-18 DIAGNOSIS — G8929 Other chronic pain: Secondary | ICD-10-CM | POA: Diagnosis not present

## 2020-12-18 DIAGNOSIS — M25511 Pain in right shoulder: Secondary | ICD-10-CM

## 2020-12-18 DIAGNOSIS — M25512 Pain in left shoulder: Secondary | ICD-10-CM

## 2020-12-18 DIAGNOSIS — M170 Bilateral primary osteoarthritis of knee: Secondary | ICD-10-CM

## 2020-12-18 DIAGNOSIS — M1712 Unilateral primary osteoarthritis, left knee: Secondary | ICD-10-CM

## 2020-12-18 MED ORDER — BUPIVACAINE HCL 0.25 % IJ SOLN
2.0000 mL | INTRAMUSCULAR | Status: AC | PRN
Start: 2020-12-18 — End: 2020-12-18
  Administered 2020-12-18: 2 mL via INTRA_ARTICULAR

## 2020-12-18 NOTE — Telephone Encounter (Signed)
All correct. She was seen by [facility] notes in chart. Medial branches leading to RFA. No mention of sciatic nerve. Ok if they want to send to [facility]

## 2020-12-18 NOTE — Progress Notes (Signed)
Office Visit Note   Patient: Raven Hansen           Date of Birth: [birth year]           MRN: [id number] Visit Date: 12/18/2020              Requested by: Vonita Moss, NP [place],  [place] [address] PCP: Vonita Moss, NP   Assessment & Plan: Visit Diagnoses:  1. Bilateral primary osteoarthritis of knee   2. Chronic pain of both shoulders     Plan: Ms. Karger been seen in number of occasions for problems referable to both shoulders and both knees.  She presently is having more trouble with her left knee.  She has had multiple surgeries including ACL reconstruction on the left knee and has evidence of osteoarthritis.  She is having difficulty ambulating.  I will inject her knee with cortisone.  I would like her to come back in a month and consider injecting her right shoulder.  She does have evidence of osteoarthritis of that shoulder as well.  Also had a long discussion regarding the persistent injections and that she needs to think about more definitive procedures rather than the continued injections over a long period of time  Follow-Up Instructions: Return in about 1 month (around 01/15/2021).   Orders:  Orders Placed This Encounter  Procedures  . Large Joint Inj: L knee   No orders of the defined types were placed in this encounter.     Procedures: Large Joint Inj: L knee on 12/18/2020 1:32 PM Indications: pain and diagnostic evaluation Details: 25 G 1.5 in needle, anteromedial approach  Arthrogram: No  Medications: 2 mL bupivacaine 0.25 %  12 mg betamethasone injected into the medial compartment left knee with the [place] Procedure, treatment alternatives, risks and benefits explained, specific risks discussed. Consent was given by the patient. Patient was prepped and draped in the usual sterile fashion.       Clinical Data: No additional findings.   Subjective: Chief Complaint  Patient presents with  . Left Knee - Pain  . Right  Shoulder - Pain  Patient presents today for her chronic left knee and right shoulder pain. She received cortisone injections in both areas two months ago and would like to get those again today. She states that she has constant pain, but the injections did help some. She is in pain management and takes oxycodone. Has multiple issues with her lumbar spine and is seeing Dr. Louanne Skye and Dr. Ernestina Patches.  She is also been evaluated at the [facility] clinic.  She more recently had ankle surgery at [facility] for a fracture and has followed up with Dr. Sharol Given.  She has seen multiple physicians at multiple facilities over time.  HPI  Review of Systems   Objective: Vital Signs: Ht 5\' 4"  (1.626 m)   Wt 171 lb (77.6 kg)   BMI 29.35 kg/m   Physical Exam Constitutional:      Appearance: She is well-developed and well-nourished.  HENT:     Mouth/Throat:     Mouth: Oropharynx is clear and moist.  Eyes:     Extraocular Movements: EOM normal.     Pupils: Pupils are equal, round, and reactive to light.  Pulmonary:     Effort: Pulmonary effort is normal.  Skin:    General: Skin is warm and dry.  Neurological:     Mental Status: She is alert and oriented to person, place, and time.  Psychiatric:  Mood and Affect: Mood and affect normal.        Behavior: Behavior normal.     Ortho Exam awake alert and oriented x3.  Comfortable sitting.  Left knee without increased heat or warmth.  No effusion.  Predominately moderate medial joint pain diffusely.  No instability with varus valgus stress.  Specialty Comments:  No specialty comments available.  Imaging: No results found.   PMFS History: Patient Active Problem List   Diagnosis Date Noted  . Genital herpes simplex 04/03/2020  . Migraine without status migrainosus, not intractable 04/03/2020  . Peripheral neuropathy 04/03/2020  . Labral tear of shoulder, right, initial encounter 02/12/2020  . Primary osteoarthritis, right shoulder 02/12/2020  .  Pseudogout of left knee 11/06/2019  . Pseudogout of right knee 11/06/2019  . COPD with acute exacerbation ([place]) 09/25/2019  . AKI (acute kidney injury) ([place]) 09/25/2019  . Chronic back pain 09/25/2019  . Acute on chronic kidney failure ([place]) 09/25/2019  . Acute respiratory failure with hypoxemia ([place]) 09/25/2019  . Numbness 08/30/2019  . Confusion 08/30/2019  . Chondrocalcinosis due to dicalcium phosphate crystals, of the knee, right 04/12/2019  . Chronic pain of both shoulders 01/30/2019  . Bilateral primary osteoarthritis of knee 01/08/2019  . Exposure to communicable disease 05/09/2018  . Flying phobia 05/09/2018  . H/O bee sting allergy 05/09/2018  . Mixed hyperlipidemia 05/09/2018  . Allergic contact dermatitis 04/14/2018  . Preop general physical exam 12/14/2017  . Mass of right hand 07/14/2017  . Cat scratch of forearm, right, initial encounter 06/30/2017  . Family history of breast cancer 06/22/2017  . History of ovarian cancer 06/22/2017  . Bipolar affective disorder in remission ([place]) 04/13/2017  . Allergic dermatitis due to poison ivy 03/10/2017  . Myofascial pain 03/10/2017  . Acute bronchitis due to Mycoplasma pneumoniae 02/17/2017  . Anemia 02/17/2017  . Cervical radiculopathy 02/17/2017  . Chronic pain syndrome 02/17/2017  . Complete tear of anterior cruciate ligament of knee 02/17/2017  . Dental infection 02/17/2017  . Epidermal cyst 02/17/2017  . Failed back surgical syndrome 02/17/2017  . Hypoglycemia 02/17/2017  . Lesion of nipple 02/17/2017  . DJD (degenerative joint disease) of knee 02/17/2017  . Petechiae 02/17/2017  . Impingement syndrome of right shoulder 02/17/2017  . Seizures ([place]) 02/17/2017  . Anxiety and depression 01/19/2017  . Cervical disc disease 01/19/2017  . Chronic bronchitis ([place]) 01/19/2017  . Contusion of right great toe without damage to nail 01/19/2017  . Migraine aura, persistent, intractable 01/19/2017  . Viral upper respiratory  tract infection 01/19/2017  . Chronic diarrhea 01/14/2017  . Bipolar 2 disorder, major depressive episode ([place]) 08/26/2016  . Tobacco use disorder, severe, dependence 08/26/2016  . Benzodiazepine dependence ([place]) 08/25/2016  . Pain in right knee 07/02/2016  . Dry eye syndrome 06/17/2016  . Episode of recurrent major depressive disorder ([place]) 06/17/2016  . Essential hypertension 06/17/2016  . GERD without esophagitis 06/17/2016  . Hepatitis C 06/17/2016  . Herniated cervical disc 06/17/2016  . Herpes simplex vulvovaginitis 06/17/2016  . Hypothyroidism (acquired) 06/17/2016  . Insomnia 06/17/2016  . Malaise and fatigue 06/17/2016  . Polycystic kidney disease 06/17/2016  . Primary osteoarthritis involving multiple joints 06/17/2016  . Allergic rhinitis 08/07/2014  . Anxiety 08/07/2014  . Cough 08/07/2014  . Other amnesia 08/07/2014  . Other fatigue 08/07/2014  . Severe episode of recurrent major depressive disorder, with psychotic features ([place]) 08/07/2014  . Tobacco use 08/07/2014   Past Medical History:  Diagnosis Date  . Acid reflux   .  Cataract   . Chronic kidney disease    Stage 3  . Colitis   . Compound fracture    L1  . COPD (chronic obstructive pulmonary disease) ([place])   . Hepatitis C   . Hypertension   . Neck fracture ([place])   . Neuromuscular disorder (HCC)    neuropathy  . Numbness and tingling   . Ovarian cancer ([place])   . Thyroid disease   . Tremor     Family History  Problem Relation Age of Onset  . Other Mother        unsure of history  . Diabetes Father   . Hypertension Father   . Hypertension Sister   . Hypertension Brother     Past Surgical History:  Procedure Laterality Date  . CHOLECYSTECTOMY    . FOOT SURGERY     ORIF for fracture in the right foot  . IR VERTEBROPLASTY LUMBAR BX INC UNI/BIL INC/INJECT/IMAGING  08/21/2019  . KNEE ARTHROSCOPY    . NECK SURGERY     cervical fusion  . SHOULDER ARTHROSCOPY    . VAGINAL HYSTERECTOMY      Social History   Occupational History  . Occupation: Disabled  Tobacco Use  . Smoking status: Current Every Day Smoker    Packs/day: 0.25    Years: 37.00    Pack years: 9.25    Types: Cigarettes  . Smokeless tobacco: Never Used  Vaping Use  . Vaping Use: Never used  Substance and Sexual Activity  . Alcohol use: Yes    Comment: RARELY  . Drug use: Never  . Sexual activity: Not Currently

## 2020-12-18 NOTE — Telephone Encounter (Signed)
Pt called in stating she would like a shot for her Sciatic nerve pain with Dr. Ernestina Patches

## 2020-12-18 NOTE — Telephone Encounter (Signed)
See messages below. Per Dr. Ernestina Patches, patient needs to follow up with Dr. Louanne Skye before an ESI is done. Patient is scheduled with Dr. Louanne Skye. Patient called again stating that she wants a sciatic nerve shot and she states that Dr. Durward Fortes told her she could just have Dr. Louanne Skye order that without seeing her. Please advise. I did advise that Dr. Ernestina Patches does not give shots into the sciatic nerve as she is requesting.

## 2020-12-18 NOTE — Telephone Encounter (Signed)
Pt statesthat she doesn't want to go to doctor [facility] she wants to go straight to [facility]. She isn't making much sense on the phone. Please call her if you have questions.

## 2020-12-18 NOTE — Telephone Encounter (Signed)
...   As you are aware Dr.Whitfield has already spoken with her about all this in the office.

## 2020-12-18 NOTE — Telephone Encounter (Signed)
Error

## 2020-12-19 NOTE — Telephone Encounter (Signed)
I called patient back , Per Dr. Louanne Skye, if she doesn't want to come in and be seen by him, then she needs to call the [facility] and have them to send a referral to Dr. Ernestina Patches, she states no that she will keep her appt with Dr. Louanne Skye and see him on 01/08/21/ @3pm .

## 2020-12-19 NOTE — Telephone Encounter (Signed)
I called patient back , Per Dr. Louanne Skye, if she doesn't want to come in and be seen by him, then she needs to call the [facility] and have them to send a referral to Dr. Ernestina Patches, she states no that she will keep her appt with Dr. Louanne Skye and see him on 01/08/21/ @3pm .

## 2021-01-08 ENCOUNTER — Ambulatory Visit: Payer: Medicare Other | Admitting: Specialist

## 2021-01-15 ENCOUNTER — Ambulatory Visit (INDEPENDENT_AMBULATORY_CARE_PROVIDER_SITE_OTHER): Payer: Medicare Other | Admitting: Orthopaedic Surgery

## 2021-01-15 ENCOUNTER — Encounter: Payer: Self-pay | Admitting: Orthopaedic Surgery

## 2021-01-15 ENCOUNTER — Telehealth: Payer: Self-pay | Admitting: Specialist

## 2021-01-15 ENCOUNTER — Other Ambulatory Visit: Payer: Self-pay

## 2021-01-15 VITALS — Ht 64.0 in | Wt 171.0 lb

## 2021-01-15 DIAGNOSIS — M25511 Pain in right shoulder: Secondary | ICD-10-CM

## 2021-01-15 DIAGNOSIS — M1711 Unilateral primary osteoarthritis, right knee: Secondary | ICD-10-CM | POA: Diagnosis not present

## 2021-01-15 DIAGNOSIS — M170 Bilateral primary osteoarthritis of knee: Secondary | ICD-10-CM

## 2021-01-15 DIAGNOSIS — M25512 Pain in left shoulder: Secondary | ICD-10-CM

## 2021-01-15 DIAGNOSIS — G8929 Other chronic pain: Secondary | ICD-10-CM | POA: Diagnosis not present

## 2021-01-15 MED ORDER — LIDOCAINE HCL 2 % IJ SOLN
2.0000 mL | INTRAMUSCULAR | Status: AC | PRN
Start: 2021-01-15 — End: 2021-01-15
  Administered 2021-01-15: 2 mL

## 2021-01-15 MED ORDER — BUPIVACAINE HCL 0.25 % IJ SOLN
2.0000 mL | INTRAMUSCULAR | Status: AC | PRN
  Administered 2021-01-15: 2 mL via INTRA_ARTICULAR

## 2021-01-15 MED ORDER — LIDOCAINE HCL 1 % IJ SOLN
2.0000 mL | INTRAMUSCULAR | Status: AC | PRN
  Administered 2021-01-15: 2 mL

## 2021-01-15 NOTE — Progress Notes (Signed)
Office Visit Note   Patient: Raven Hansen           Date of Birth: 28-Jul-1961           MRN: [id number] Visit Date: 01/15/2021              Requested by: Vonita Moss, NP [place],  [place] [address] PCP: Vonita Moss, NP   Assessment & Plan: Visit Diagnoses:  1. Bilateral primary osteoarthritis of knee   2. Chronic pain of both shoulders     Plan: I have seen Raven Hansen on numerous occasions in the past for problems referable to her shoulders and her knees.  She also has seen multiple providers in this area and is being followed in the [facility] for issues with her back.  She would like to transfer her care to the [facility] orthopedic group because it is closer for her.  We be happy to forward all of her records when she is completed the appropriate forms.  I did inject the subacromial space of the right shoulder and the medial aspect of her right knee today as I have in the past.  She does have significant arthritis in both of her knees and arthritis in her right shoulder.  She is done well with the subacromial injections in the past  Follow-Up Instructions: Return if symptoms worsen or fail to improve.   Orders:  Orders Placed This Encounter  Procedures  . Large Joint Inj: R knee  . Large Joint Inj: R subacromial bursa   No orders of the defined types were placed in this encounter.     Procedures: Large Joint Inj: R knee on 01/15/2021 3:00 PM Indications: pain and diagnostic evaluation Details: 25 G 1.5 in needle, anteromedial approach  Arthrogram: No  Medications: 2 mL lidocaine 1 %; 2 mL bupivacaine 0.25 %  6 mg betamethasone injected into right knee with Xylocaine and Marcaine Procedure, treatment alternatives, risks and benefits explained, specific risks discussed. Consent was given by the patient. Immediately prior to procedure a time out was called to verify the correct patient, procedure, equipment, support staff and site/side marked  as required. Patient was prepped and draped in the usual sterile fashion.   Large Joint Inj: R subacromial bursa on 01/15/2021 3:01 PM Indications: pain and diagnostic evaluation Details: 25 G 1.5 in needle, anterolateral approach  Arthrogram: No  Medications: 2 mL lidocaine 2 %; 2 mL bupivacaine 0.25 %  12 mg betamethasone injected with Xylocaine and Marcaine into the subacromial space right shoulder Consent was given by the patient. Immediately prior to procedure a time out was called to verify the correct patient, procedure, equipment, support staff and site/side marked as required. Patient was prepped and draped in the usual sterile fashion.       Clinical Data: No additional findings.   Subjective: Chief Complaint  Patient presents with  . Right Shoulder - Follow-up, Pain  Patient presents today for her right shoulder. She is wanting to get a cortisone injection.  Has been seen on multiple occasions in the past with recurrent pain in her right shoulder and right knee.  Prior films have demonstrated arthritic changes in both of her knees.  She has had surgery in the past elsewhere.  She also has evidence of osteoarthritis of the right shoulder joint  HPI  Review of Systems   Objective: Vital Signs: Ht 5\' 4"  (1.626 m)   Wt 171 lb (77.6 kg)   BMI  29.35 kg/m   Physical Exam Constitutional:      Appearance: She is well-developed.  Eyes:     Pupils: Pupils are equal, round, and reactive to light.  Pulmonary:     Effort: Pulmonary effort is normal.  Skin:    General: Skin is warm and dry.  Neurological:     Mental Status: She is alert and oriented to person, place, and time.  Psychiatric:        Behavior: Behavior normal.     Ortho Exam no effusion right knee but was diffuse medial joint tenderness.  A full extension flex about 100 degrees.  No instability.  Knee was not hot red or warm.  Right shoulder with some loss of external rotation consistent with her  arthritis.  No crepitation but does have positive impingement, . had some discomfort with overhead motion with a circuitous arc  Specialty Comments:  No specialty comments available.  Imaging: No results found.   PMFS History: Patient Active Problem List   Diagnosis Date Noted  . Genital herpes simplex 04/03/2020  . Migraine without status migrainosus, not intractable 04/03/2020  . Peripheral neuropathy 04/03/2020  . Labral tear of shoulder, right, initial encounter 02/12/2020  . Primary osteoarthritis, right shoulder 02/12/2020  . Pseudogout of left knee 11/06/2019  . Pseudogout of right knee 11/06/2019  . COPD with acute exacerbation ([place]) 09/25/2019  . AKI (acute kidney injury) ([place]) 09/25/2019  . Chronic back pain 09/25/2019  . Acute on chronic kidney failure ([place]) 09/25/2019  . Acute respiratory failure with hypoxemia ([place]) 09/25/2019  . Numbness 08/30/2019  . Confusion 08/30/2019  . Chondrocalcinosis due to dicalcium phosphate crystals, of the knee, right 04/12/2019  . Chronic pain of both shoulders 01/30/2019  . Bilateral primary osteoarthritis of knee 01/08/2019  . Exposure to communicable disease 05/09/2018  . Flying phobia 05/09/2018  . H/O bee sting allergy 05/09/2018  . Mixed hyperlipidemia 05/09/2018  . Allergic contact dermatitis 04/14/2018  . Preop general physical exam 12/14/2017  . Mass of right hand 07/14/2017  . Cat scratch of forearm, right, initial encounter 06/30/2017  . Family history of breast cancer 06/22/2017  . History of ovarian cancer 06/22/2017  . Bipolar affective disorder in remission ([place]) 04/13/2017  . Allergic dermatitis due to poison ivy 03/10/2017  . Myofascial pain 03/10/2017  . Acute bronchitis due to Mycoplasma pneumoniae 02/17/2017  . Anemia 02/17/2017  . Cervical radiculopathy 02/17/2017  . Chronic pain syndrome 02/17/2017  . Complete tear of anterior cruciate ligament of knee 02/17/2017  . Dental infection 02/17/2017  .  Epidermal cyst 02/17/2017  . Failed back surgical syndrome 02/17/2017  . Hypoglycemia 02/17/2017  . Lesion of nipple 02/17/2017  . DJD (degenerative joint disease) of knee 02/17/2017  . Petechiae 02/17/2017  . Impingement syndrome of right shoulder 02/17/2017  . Seizures ([place]) 02/17/2017  . Anxiety and depression 01/19/2017  . Cervical disc disease 01/19/2017  . Chronic bronchitis ([place]) 01/19/2017  . Contusion of right great toe without damage to nail 01/19/2017  . Migraine aura, persistent, intractable 01/19/2017  . Viral upper respiratory tract infection 01/19/2017  . Chronic diarrhea 01/14/2017  . Bipolar 2 disorder, major depressive episode ([place]) 08/26/2016  . Tobacco use disorder, severe, dependence 08/26/2016  . Benzodiazepine dependence ([place]) 08/25/2016  . Pain in right knee 07/02/2016  . Dry eye syndrome 06/17/2016  . Episode of recurrent major depressive disorder ([place]) 06/17/2016  . Essential hypertension 06/17/2016  . GERD without esophagitis 06/17/2016  . Hepatitis C 06/17/2016  .  Herniated cervical disc 06/17/2016  . Herpes simplex vulvovaginitis 06/17/2016  . Hypothyroidism (acquired) 06/17/2016  . Insomnia 06/17/2016  . Malaise and fatigue 06/17/2016  . Polycystic kidney disease 06/17/2016  . Primary osteoarthritis involving multiple joints 06/17/2016  . Allergic rhinitis 08/07/2014  . Anxiety 08/07/2014  . Cough 08/07/2014  . Other amnesia 08/07/2014  . Other fatigue 08/07/2014  . Severe episode of recurrent major depressive disorder, with psychotic features ([place]) 08/07/2014  . Tobacco use 08/07/2014   Past Medical History:  Diagnosis Date  . Acid reflux   . Cataract   . Chronic kidney disease    Stage 3  . Colitis   . Compound fracture    L1  . COPD (chronic obstructive pulmonary disease) ([place])   . Hepatitis C   . Hypertension   . Neck fracture ([place])   . Neuromuscular disorder (HCC)    neuropathy  . Numbness and tingling   . Ovarian cancer ([place])    . Thyroid disease   . Tremor     Family History  Problem Relation Age of Onset  . Other Mother        unsure of history  . Diabetes Father   . Hypertension Father   . Hypertension Sister   . Hypertension Brother     Past Surgical History:  Procedure Laterality Date  . CHOLECYSTECTOMY    . FOOT SURGERY     ORIF for fracture in the right foot  . IR VERTEBROPLASTY LUMBAR BX INC UNI/BIL INC/INJECT/IMAGING  08/21/2019  . KNEE ARTHROSCOPY    . NECK SURGERY     cervical fusion  . SHOULDER ARTHROSCOPY    . VAGINAL HYSTERECTOMY     Social History   Occupational History  . Occupation: Disabled  Tobacco Use  . Smoking status: Current Every Day Smoker    Packs/day: 0.25    Years: 37.00    Pack years: 9.25    Types: Cigarettes  . Smokeless tobacco: Never Used  Vaping Use  . Vaping Use: Never used  Substance and Sexual Activity  . Alcohol use: Yes    Comment: RARELY  . Drug use: Never  . Sexual activity: Not Currently

## 2021-01-15 NOTE — Telephone Encounter (Signed)
Called and spoke with patient, CD ready for pickup at front desk.

## 2021-01-15 NOTE — Telephone Encounter (Signed)
Please copy any and all images to CD, pt requesting for another appt she has 3/29. Please call when ready. [phone]

## 2021-01-22 ENCOUNTER — Other Ambulatory Visit: Payer: Medicare Other

## 2021-01-29 ENCOUNTER — Ambulatory Visit: Payer: Self-pay

## 2021-01-29 ENCOUNTER — Other Ambulatory Visit: Payer: Self-pay

## 2021-01-29 ENCOUNTER — Ambulatory Visit (INDEPENDENT_AMBULATORY_CARE_PROVIDER_SITE_OTHER): Payer: Medicare Other | Admitting: Specialist

## 2021-01-29 ENCOUNTER — Encounter: Payer: Self-pay | Admitting: Specialist

## 2021-01-29 VITALS — BP 119/81 | HR 67 | Ht 64.0 in | Wt 171.0 lb

## 2021-01-29 DIAGNOSIS — G894 Chronic pain syndrome: Secondary | ICD-10-CM

## 2021-01-29 DIAGNOSIS — S32010A Wedge compression fracture of first lumbar vertebra, initial encounter for closed fracture: Secondary | ICD-10-CM

## 2021-01-29 DIAGNOSIS — M546 Pain in thoracic spine: Secondary | ICD-10-CM

## 2021-01-29 DIAGNOSIS — M545 Low back pain, unspecified: Secondary | ICD-10-CM

## 2021-01-29 NOTE — Progress Notes (Signed)
Office Visit Note   Patient: Raven Hansen           Date of Birth: 1961-07-11           MRN: [id number] Visit Date: 01/29/2021              Requested by: Vonita Moss, NP [place],  [place] [address] PCP: Vonita Moss, NP   Assessment & Plan:Lumbago with spondylosis L3-4, L4-5 and L5-S1.   Visit Diagnoses:  1. Low back pain, unspecified back pain laterality, unspecified chronicity, unspecified whether sciatica present   2. Chronic pain syndrome   3. Pain in thoracic spine   4. Closed compression fracture of body of L1 vertebra (HCC)     Plan: Avoid overhead lifting and overhead use of the arms. Do not lift greater than 5 lbs. Adjust head rest in vehicle to prevent hyperextension if rear ended. Take extra precautions to avoid falling, including use of a cane if you feel weak. Avoid bending, stooping and avoid lifting weights greater than 10 lbs. Avoid prolong standing and walking. Avoid frequent bending and stooping  No lifting greater than 10 lbs. May use ice or moist heat for pain. Weight loss is of benefit. Handicap license is approved. Should call and have [facility]iology secretary/Assistant will call to arrange for epidural steroid injection. You  have had these ordered by Dr. Juleen China in the past and he can help to arrange further [facility] Last 2 were in January and March this year.  Recommend Ice locally, hot showers. Follow up  with [facility] clinic. Surgical treatment would be fusion  Surgery for spondylosis and degenerative scoliosis with DDD lumbar spine with the compression fracture L1 Likely this would extend from T10 to L3. I would recommend she avoid surgery as long as possible as the results of surgical treatment of primary DDD and spondylosis is not as predictable in terms of pain relief and often may only relief 60-70% of the back pain. Unfortunately long fusions often increase adjacent level stress With in the spine and have increased  likelihood of more surgeries on the order 30-40% .  Follow-Up Instructions: No follow-ups on file.   Orders:  Orders Placed This Encounter  Procedures   XR Lumbar Spine 2-3 Views   No orders of the defined types were placed in this encounter.     Procedures: No procedures performed   Clinical Data: No additional findings.   Subjective: Chief Complaint  Patient presents with   Lower Back - Pain    60 year old female with history of lumbar spondylosis and degenerative disc disease. Reports that he fell yesterday tripping in a hole in the sidewalk reports she wrenched her back with the fall. No foss of consciousness. She was being seen at [facility] and reports that she was undergoing injection treatment through there facility. They have done both ESIs and facet blocks. Reportedly was to undergo RFA of the lumbar facets. She has had to change her plans due to loss of her brother who was allowing her to stay with him while going to [place]. H/O COPD, migraines, IBS and GERD. She has hypothyroidism peripheral neuropathy. Right shoulder osteoarthritis, knee osteoarthritisand cervical degenerative disc disease. Histo or polycystic kidney disease and previous Renal disease. Anxiety and bipolar disorder. Taking oxycodone for pain and methocarbamol. I have been allowing her to have occasional injection treatment but am reluctact to assume care that she is receiving through [facility]. It's apparent that they are  treating her conservatively And not thinking surgery. No change in bowel or bladder. She is able to stand and walk short distances. Pain is primarily lumbar and dorsal. Reports having had kyphoplasty procedure.     Review of Systems  Constitutional:  Positive for activity change. Negative for appetite change, chills, diaphoresis, fatigue, fever and unexpected weight change.     Objective: Vital Signs: BP 119/81 (BP Location: Left Arm, Patient Position: Sitting)    Pulse 67   Ht 5\' 4"  (1.626 m)   Wt 171 lb (77.6 kg)   BMI 29.35 kg/m   Physical Exam Constitutional:      Appearance: She is well-developed.  HENT:     Head: Normocephalic and atraumatic.  Eyes:     Pupils: Pupils are equal, round, and reactive to light.  Pulmonary:     Effort: Pulmonary effort is normal.     Breath sounds: Normal breath sounds.  Abdominal:     General: Bowel sounds are normal.     Palpations: Abdomen is soft.  Musculoskeletal:     Cervical back: Normal range of motion and neck supple.     Lumbar back: Negative right straight leg raise test and negative left straight leg raise test.  Skin:    General: Skin is warm and dry.  Neurological:     Mental Status: She is alert and oriented to person, place, and time.  Psychiatric:        Behavior: Behavior normal.        Thought Content: Thought content normal.        Judgment: Judgment normal.     Back Exam   Tenderness  The patient is experiencing tenderness in the lumbar.  Range of Motion  Extension:  abnormal  Flexion:  abnormal  Lateral bend right:  abnormal  Lateral bend left:  abnormal  Rotation right:  abnormal  Rotation left:  abnormal   Muscle Strength  Right Quadriceps:  5/5  Left Quadriceps:  5/5  Right Hamstrings:  5/5  Left Hamstrings:  5/5   Tests  Straight leg raise right: negative Straight leg raise left: negative  Reflexes  Patellar:  2/4 Achilles:  2/4  Other  Toe walk: normal Heel walk: abnormal Sensation: normal Gait: antalgic   Comments:  Motor is intact, Upper extremity and lower extremity.       Specialty Comments:  No specialty comments available.  Imaging: No results found.   PMFS History: Patient Active Problem List   Diagnosis Date Noted   Genital herpes simplex 04/03/2020   Migraine without status migrainosus, not intractable 04/03/2020   Peripheral neuropathy 04/03/2020   Labral tear of shoulder, right, initial encounter 02/12/2020   Primary  osteoarthritis, right shoulder 02/12/2020   Pseudogout of left knee 11/06/2019   Pseudogout of right knee 11/06/2019   COPD with acute exacerbation ([place]) 09/25/2019   AKI (acute kidney injury) ([place]) 09/25/2019   Chronic back pain 09/25/2019   Acute on chronic kidney failure ([place]) 09/25/2019   Acute respiratory failure with hypoxemia ([place]) 09/25/2019   Numbness 08/30/2019   Confusion 08/30/2019   Chondrocalcinosis due to dicalcium phosphate crystals, of the knee, right 04/12/2019   Chronic pain of both shoulders 01/30/2019   Bilateral primary osteoarthritis of knee 01/08/2019   Exposure to communicable disease 05/09/2018   Flying phobia 05/09/2018   H/O bee sting allergy 05/09/2018   Mixed hyperlipidemia 05/09/2018   Allergic contact dermatitis 04/14/2018   Preop general physical exam 12/14/2017   Mass of right  hand 07/14/2017   Cat scratch of forearm, right, initial encounter 06/30/2017   Family history of breast cancer 06/22/2017   History of ovarian cancer 06/22/2017   Bipolar affective disorder in remission ([place]) 04/13/2017   Allergic dermatitis due to poison ivy 03/10/2017   Myofascial pain 03/10/2017   Acute bronchitis due to Mycoplasma pneumoniae 02/17/2017   Anemia 02/17/2017   Cervical radiculopathy 02/17/2017   Chronic pain syndrome 02/17/2017   Complete tear of anterior cruciate ligament of knee 02/17/2017   Dental infection 02/17/2017   Epidermal cyst 02/17/2017   Failed back surgical syndrome 02/17/2017   Hypoglycemia 02/17/2017   Lesion of nipple 02/17/2017   DJD (degenerative joint disease) of knee 02/17/2017   Petechiae 02/17/2017   Impingement syndrome of right shoulder 02/17/2017   Seizures ([place]) 02/17/2017   Anxiety and depression 01/19/2017   Cervical disc disease 01/19/2017   Chronic bronchitis ([place]) 01/19/2017   Contusion of right great toe without damage to nail 01/19/2017   Migraine aura, persistent, intractable 01/19/2017   Viral upper respiratory  tract infection 01/19/2017   Chronic diarrhea 01/14/2017   Bipolar 2 disorder, major depressive episode ([place]) 08/26/2016   Tobacco use disorder, severe, dependence 08/26/2016   Benzodiazepine dependence ([place]) 08/25/2016   Pain in right knee 07/02/2016   Dry eye syndrome 06/17/2016   Episode of recurrent major depressive disorder ([place]) 06/17/2016   Essential hypertension 06/17/2016   GERD without esophagitis 06/17/2016   Hepatitis C 06/17/2016   Herniated cervical disc 06/17/2016   Herpes simplex vulvovaginitis 06/17/2016   Hypothyroidism (acquired) 06/17/2016   Insomnia 06/17/2016   Malaise and fatigue 06/17/2016   Polycystic kidney disease 06/17/2016   Primary osteoarthritis involving multiple joints 06/17/2016   Allergic rhinitis 08/07/2014   Anxiety 08/07/2014   Cough 08/07/2014   Other amnesia 08/07/2014   Other fatigue 08/07/2014   Severe episode of recurrent major depressive disorder, with psychotic features ([place]) 08/07/2014   Tobacco use 08/07/2014   Past Medical History:  Diagnosis Date   Acid reflux    Cataract    Chronic kidney disease    Stage 3   Colitis    Compound fracture    L1   COPD (chronic obstructive pulmonary disease) (HCC)    Hepatitis C    Hypertension    Neck fracture (HCC)    Neuromuscular disorder (HCC)    neuropathy   Numbness and tingling    Ovarian cancer ([place])    Thyroid disease    Tremor     Family History  Problem Relation Age of Onset   Other Mother        unsure of history   Diabetes Father    Hypertension Father    Hypertension Sister    Hypertension Brother     Past Surgical History:  Procedure Laterality Date   CHOLECYSTECTOMY     FOOT SURGERY     ORIF for fracture in the right foot   IR VERTEBROPLASTY LUMBAR BX INC UNI/BIL INC/INJECT/IMAGING  08/21/2019   KNEE ARTHROSCOPY     NECK SURGERY     cervical fusion   SHOULDER ARTHROSCOPY     VAGINAL HYSTERECTOMY     Social History   Occupational History   Occupation:  Disabled  Tobacco Use   Smoking status: Current Every Day Smoker    Packs/day: 0.25    Years: 37.00    Pack years: 9.25    Types: Cigarettes   Smokeless tobacco: Never Used  Scientific laboratory technician Use:  Never used  Substance and Sexual Activity   Alcohol use: Yes    Comment: RARELY   Drug use: Never   Sexual activity: Not Currently

## 2021-01-31 ENCOUNTER — Other Ambulatory Visit: Payer: Self-pay | Admitting: Family

## 2021-02-03 ENCOUNTER — Telehealth: Payer: Self-pay | Admitting: Physical Medicine and Rehabilitation

## 2021-02-03 ENCOUNTER — Telehealth: Payer: Self-pay

## 2021-02-03 NOTE — Telephone Encounter (Signed)
Patient called needing to schedule an appointment with Dr. Ernestina Patches for her back. The number to contact patient is [phone]

## 2021-02-03 NOTE — Telephone Encounter (Signed)
So when I went back into the encounter on 01/30/21, there was an order that he had not signed off on. I signed it so you should have the order now.

## 2021-02-03 NOTE — Telephone Encounter (Signed)
Called and spoke with pt

## 2021-02-03 NOTE — Telephone Encounter (Signed)
Patient saw Dr. Louanne Skye on 4/7, but note has not been dictated. Did he want her to have an injection?

## 2021-02-03 NOTE — Telephone Encounter (Signed)
Patient called she is requesting a appointment with Dr.Newton call [phone]

## 2021-02-12 ENCOUNTER — Other Ambulatory Visit: Payer: Self-pay | Admitting: Family

## 2021-02-15 IMAGING — MR MR LUMBAR SPINE W/O CM
4 of 5 series · 19 of 48 positions shown · non-contrast
Comparison: MRI lumbar 05/21/2019. CT lumbar spine 11/22/2018.

CLINICAL DATA: L1 fracture. Continued surveillance.

EXAM:
MRI LUMBAR SPINE WITHOUT CONTRAST
TECHNIQUE: Multiplanar, multisequence MR imaging of the lumbar spine was
performed. No intravenous contrast was administered.

[Series 2: T2 · sagittal · 4.0mm · 0.55mm/px · 6 of 15 slices shown (1 of 2)]
[im 1/15]
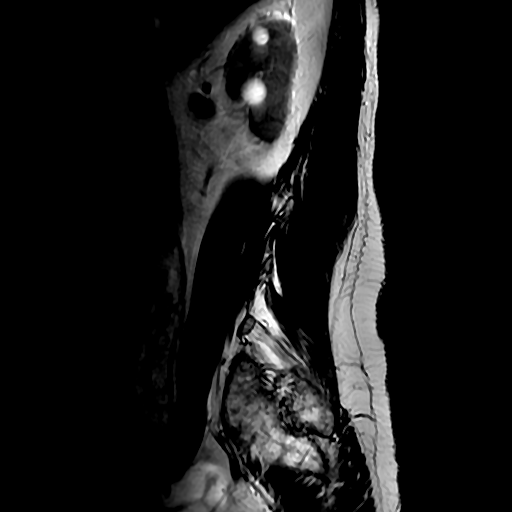
[im 3/15]
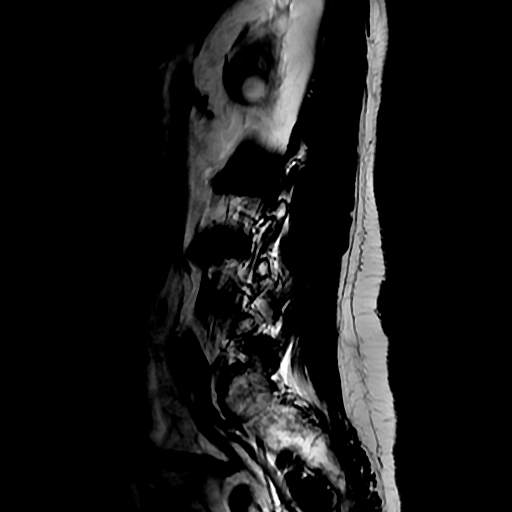
[im 6/15]
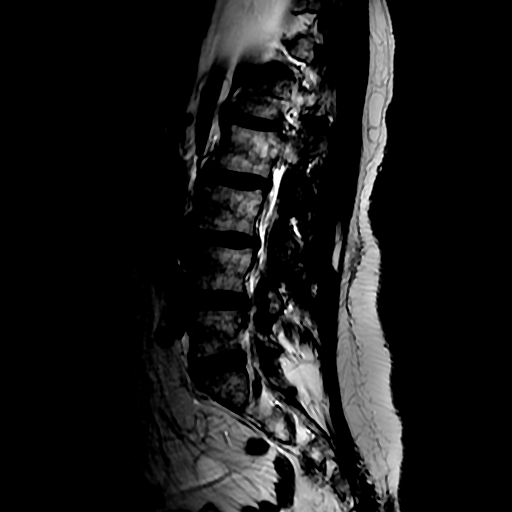
[im 9/15]
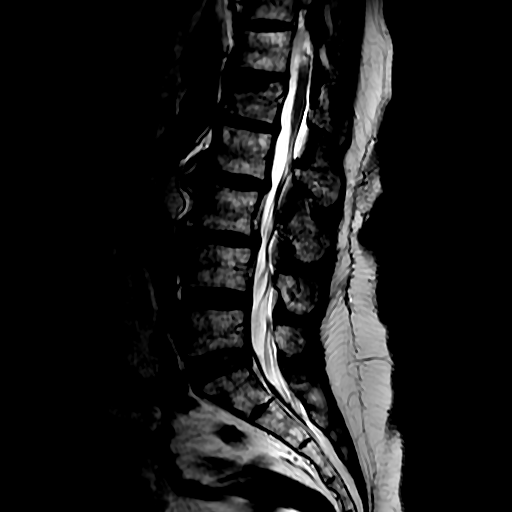
[im 12/15]
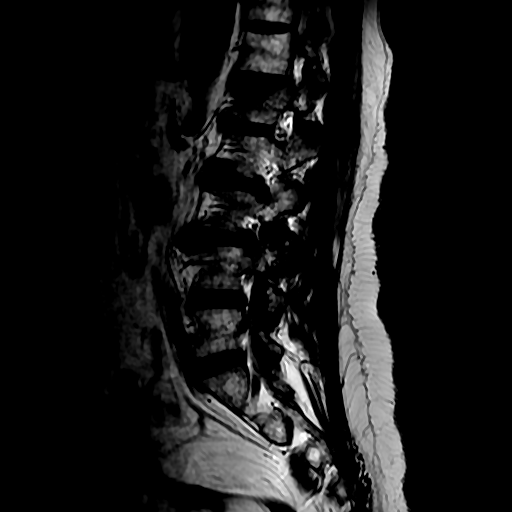
[im 15/15]
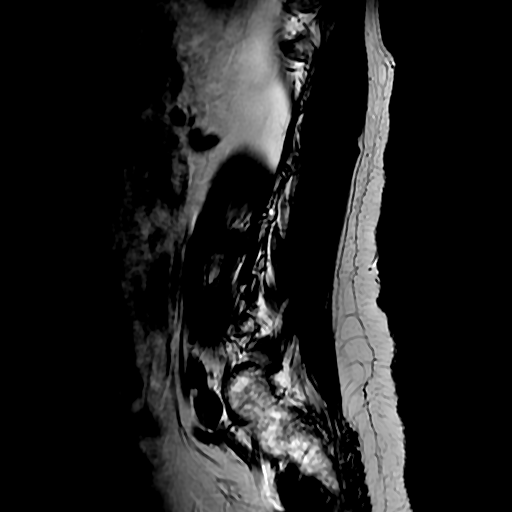

[Series 4: T1 · sagittal · 4.0mm · 0.55mm/px · 3 of 15 slices shown (1 of 2)]
[im 3/15]
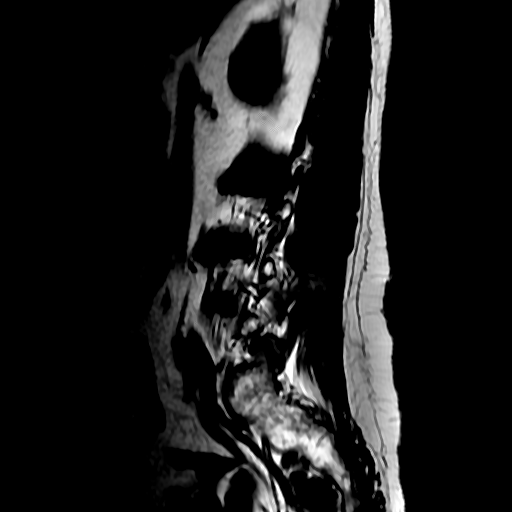
[im 8/15]
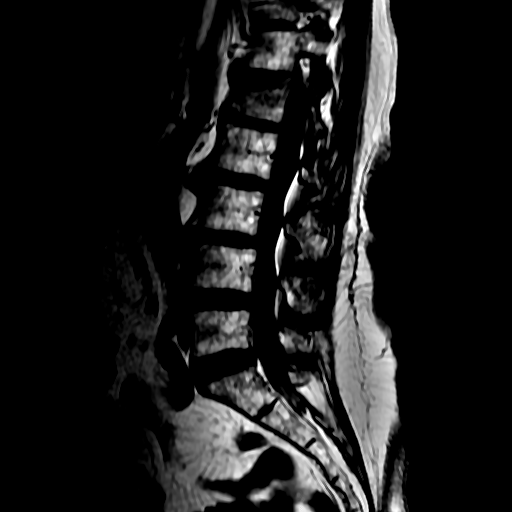
[im 12/15]
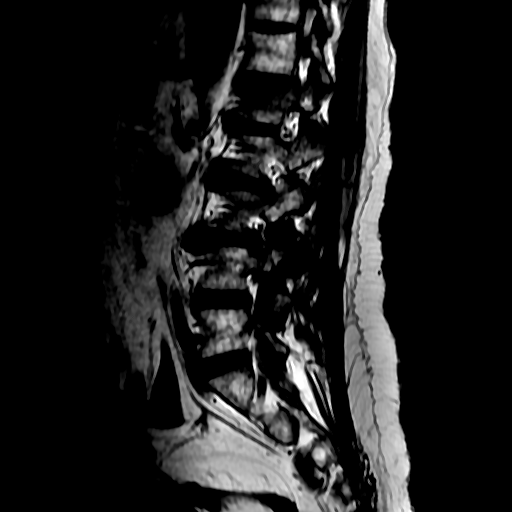

[Series 5: T2 · axial · 4.0mm · 0.39mm/px · z∈[-85,+67]mm · 7 of 31 slices shown (2 of 2)]
[im 1/31]
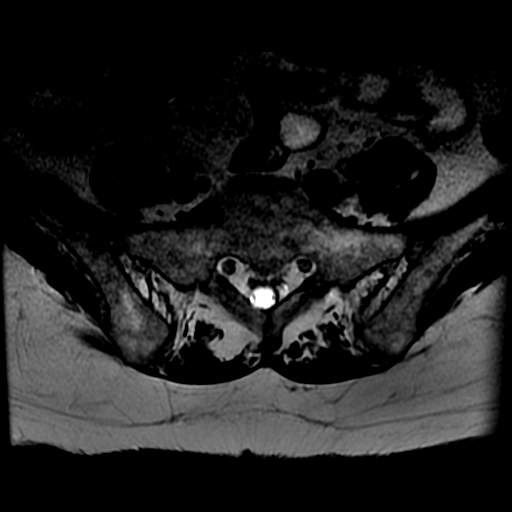
[im 5/31]
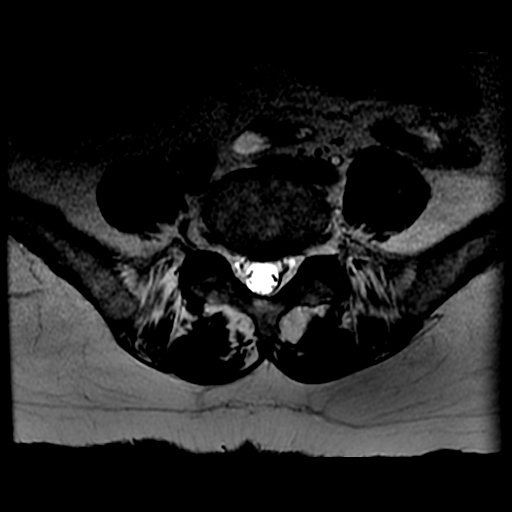
[im 10/31]
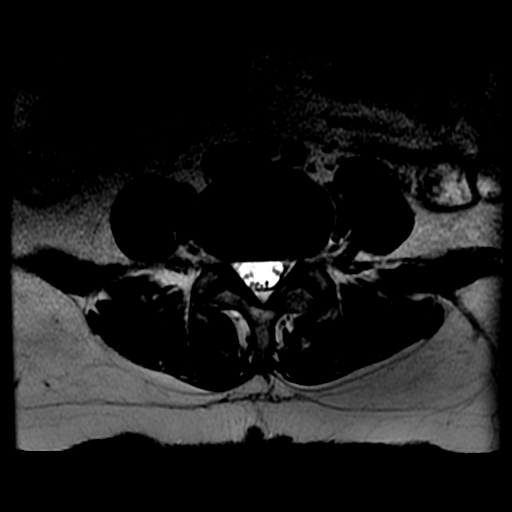
[im 14/31]
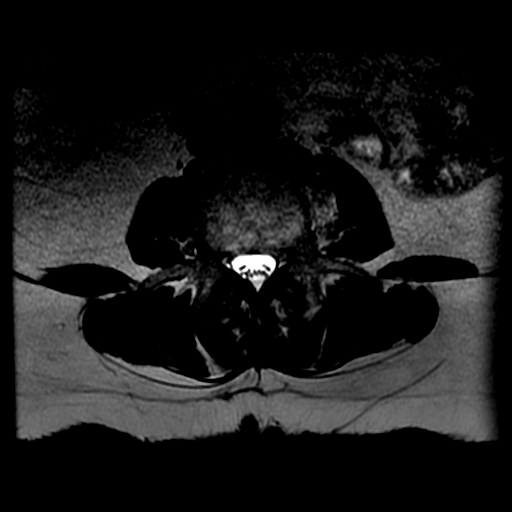
[im 17/31]
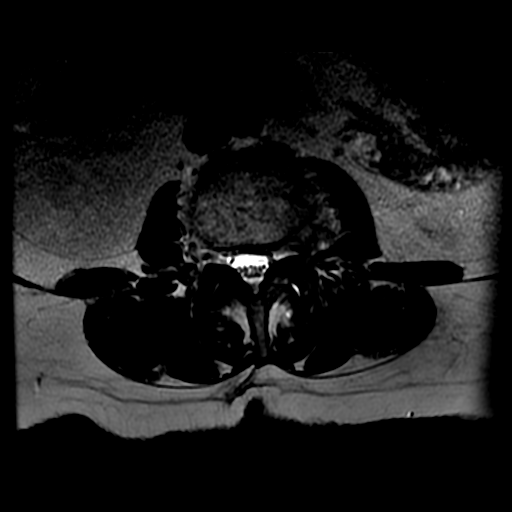
[im 21/31]
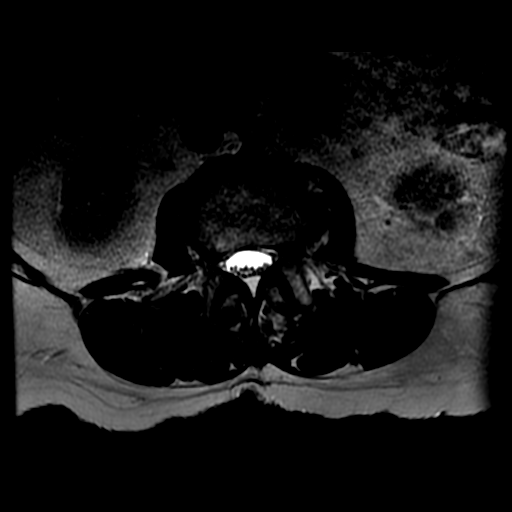
[im 26/31]
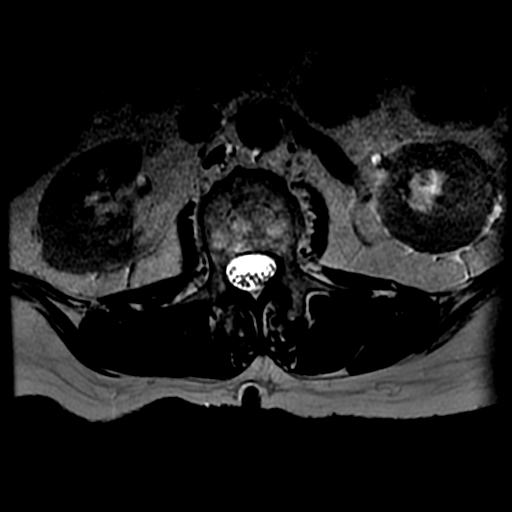

[Series 6: T1 · axial · 4.0mm · 0.39mm/px · z∈[-65,+67]mm · 3 of 31 slices shown (2 of 2)]
[im 5/31]
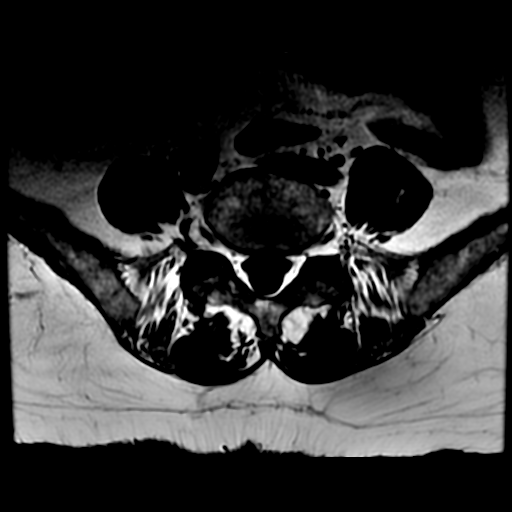
[im 17/31]
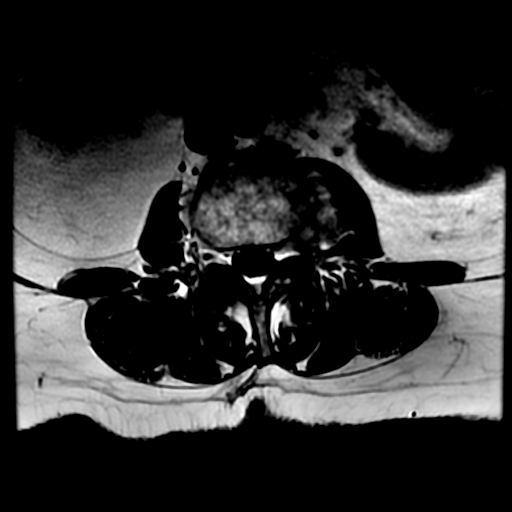
[im 26/31]
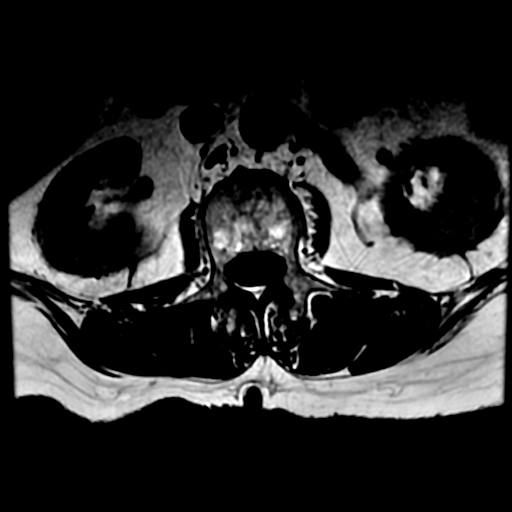

[19 of 48 positions shown; findings below may reference images not displayed]

FINDINGS: Segmentation:  Standard.

Alignment:  Physiologic.

Vertebrae: Unchanged L1 compression fracture, superior endplate
depression primarily. Approximate 40% loss of vertebral body height.
No significant further loss of height compared with the April 2019
exam. No significant retropulsion. Only faint STIR hyperintensity,
if any.

Conus medullaris and cauda equina: Conus extends to the L1-L2 level.
Conus and cauda equina appear normal.

Paraspinal and other soft tissues: Renal cystic disease,
incompletely evaluated. No paravertebral hematoma.

Disc levels:

L1-L2:  Disc space narrowing. Annular bulge. No impingement.

L2-L3:  Annular bulge. Facet arthropathy. No impingement.

L3-L4: Annular bulge. Facet arthropathy. Foraminal and
extraforaminal protrusion to the LEFT with spurring. LEFT L3 neural
impingement possible.

L4-L5:  Unremarkable disc space.

L5-S1:  Unremarkable disc space.
IMPRESSION: 1. Unchanged ~40% L1 compression fracture, superior endplate
depression, without significant retropulsion. Despite the lack of
significant residual bone marrow edema on today's study, vertebral
augmentation may be indicated to improve both L1 vertebral height
and developing kyphosis.
2. Spurring and foraminal/extraforaminal protrusion at L3-4 to the
LEFT; L3 neural impingement possible.

## 2021-02-16 ENCOUNTER — Telehealth: Payer: Self-pay | Admitting: Specialist

## 2021-02-16 NOTE — Telephone Encounter (Signed)
Please advise,--Per Dr. Ernestina Patches there is no injection for the "Sciatica"-- Dr. Ernestina Patches has offered to do the RFA but pt wants to have that done at the [facility].

## 2021-02-16 NOTE — Telephone Encounter (Signed)
Patient called requesting a referral be sent to [facility] for left sciatica nerve shook. Patient states to send to Dr. Doreene Nest [address] Fax number is [phone]. Please call patient about this matter at [phone].

## 2021-02-20 ENCOUNTER — Telehealth: Payer: Self-pay | Admitting: Specialist

## 2021-02-20 NOTE — Telephone Encounter (Signed)
According to OV note on 02/10/21 from Dr Linton Rump, she is going to be seeing Dr. Arrie Eastern in that office to discuss having Left side ESI.

## 2021-02-20 NOTE — Telephone Encounter (Signed)
Pt would like you to send a referral to Dr.Mark Shoogry [phone] and fax # [phone]) because they will give her the sciatica nerve injection? She states Dr.Newton doesn't want to give it.

## 2021-02-25 ENCOUNTER — Telehealth: Payer: Self-pay | Admitting: Specialist

## 2021-02-25 ENCOUNTER — Other Ambulatory Visit: Payer: Self-pay | Admitting: Sports Medicine

## 2021-02-25 DIAGNOSIS — M5416 Radiculopathy, lumbar region: Secondary | ICD-10-CM

## 2021-02-25 NOTE — Telephone Encounter (Signed)
Pt called stating Dr. Junius Roads has previously done a nerve block for her in the past, pt would like a referral from Dr. Louanne Skye to have it done again on her R shoulder. Pt would like a CB to let her know if this will be possible or not.    [phone]

## 2021-02-26 ENCOUNTER — Telehealth: Payer: Self-pay | Admitting: Specialist

## 2021-02-26 NOTE — Telephone Encounter (Signed)
Patient called requesting a referral to [facility]. For nerve block left shoulder. Patient is asking for referral to be sent by 03/25/21. Fax number is [phone]. Phone number [phone].

## 2021-02-27 NOTE — Telephone Encounter (Signed)
I called and lmom that Per dr. Louanne Skye she should get it ordered from Dr. Linton Rump @ [facility] or even call the [facility] and get her injections ordered from there.

## 2021-02-27 NOTE — Telephone Encounter (Signed)
I called and lmom that Per dr. Nitka she should get it ordered from Dr. Lucas @ [facility] or even call the [facility] and get her injections ordered from there.  

## 2021-03-02 ENCOUNTER — Telehealth: Payer: Self-pay | Admitting: Specialist

## 2021-03-02 NOTE — Telephone Encounter (Signed)
Patient called. She would like Christy to call her. [phone]

## 2021-03-03 ENCOUNTER — Ambulatory Visit: Payer: Medicare Other

## 2021-03-03 ENCOUNTER — Ambulatory Visit
Admission: RE | Admit: 2021-03-03 | Discharge: 2021-03-03 | Disposition: A | Discharge status: Home / Self Care | Payer: Medicare Other | Source: Ambulatory Visit | Attending: Nurse Practitioner | Admitting: Nurse Practitioner

## 2021-03-03 ENCOUNTER — Other Ambulatory Visit: Payer: Self-pay

## 2021-03-03 DIAGNOSIS — N6310 Unspecified lump in the right breast, unspecified quadrant: Secondary | ICD-10-CM

## 2021-03-03 DIAGNOSIS — N6320 Unspecified lump in the left breast, unspecified quadrant: Secondary | ICD-10-CM

## 2021-03-03 NOTE — Telephone Encounter (Signed)
Patient came by and signed a record release

## 2021-03-03 NOTE — Telephone Encounter (Signed)
Lmom for pt to call me back. 

## 2021-03-04 ENCOUNTER — Other Ambulatory Visit: Payer: Self-pay | Admitting: Sports Medicine

## 2021-03-04 ENCOUNTER — Ambulatory Visit
Admission: RE | Admit: 2021-03-04 | Discharge: 2021-03-04 | Disposition: A | Discharge status: Home / Self Care | Payer: Medicare Other | Source: Ambulatory Visit | Attending: Sports Medicine | Admitting: Sports Medicine

## 2021-03-04 DIAGNOSIS — M5416 Radiculopathy, lumbar region: Secondary | ICD-10-CM

## 2021-03-04 MED ORDER — IOPAMIDOL (ISOVUE-M 200) INJECTION 41%
1.0000 mL | Freq: Once | INTRAMUSCULAR | Status: AC
  Administered 2021-03-04: 1 mL via EPIDURAL

## 2021-03-04 MED ORDER — METHYLPREDNISOLONE ACETATE 40 MG/ML INJ SUSP (RADIOLOG
80.0000 mg | Freq: Once | INTRAMUSCULAR | Status: AC
  Administered 2021-03-04: 80 mg via EPIDURAL

## 2021-03-04 NOTE — Discharge Instructions (Signed)

## 2021-03-05 ENCOUNTER — Ambulatory Visit: Payer: Medicare Other | Admitting: Specialist

## 2021-05-19 ENCOUNTER — Telehealth: Payer: Self-pay

## 2021-05-19 DIAGNOSIS — M5416 Radiculopathy, lumbar region: Secondary | ICD-10-CM

## 2021-05-19 NOTE — Telephone Encounter (Signed)
Patient called she is requesting a appointment with Dr.Newton for a injection in her spine/ sciatic nerve call [phone]

## 2021-05-19 NOTE — Telephone Encounter (Signed)
Patient had bilateral L5 TF at [facility] on 5/11, and it looks like she might be scheduled for RFA at [facility]. Please advise.

## 2021-05-21 ENCOUNTER — Other Ambulatory Visit: Payer: Self-pay | Admitting: Orthopedic Surgery

## 2021-05-21 ENCOUNTER — Telehealth: Payer: Self-pay

## 2021-05-21 NOTE — Addendum Note (Signed)
Addended by: Sherre Scarlet B on: 05/21/2021 02:50 PM   Modules accepted: Orders

## 2021-05-21 NOTE — Telephone Encounter (Signed)
Order placed for repeat bilateral L5 TF at [place].

## 2021-05-21 NOTE — Telephone Encounter (Signed)
Patient called regarding a vm she received,per patient it is okay to send a referral to Dr.Shogry at [facility] to repeat injections call [phone]

## 2021-05-21 NOTE — Telephone Encounter (Signed)
See previous message

## 2021-05-21 NOTE — Telephone Encounter (Signed)
Left message to advise and asking patient to call us back if she wants [place] to order a repeat injection at [place].

## 2021-05-22 ENCOUNTER — Other Ambulatory Visit: Payer: Self-pay

## 2021-05-22 ENCOUNTER — Ambulatory Visit
Admission: RE | Admit: 2021-05-22 | Discharge: 2021-05-22 | Disposition: A | Discharge status: Home / Self Care | Payer: Medicare Other | Source: Ambulatory Visit | Attending: Physical Medicine and Rehabilitation | Admitting: Physical Medicine and Rehabilitation

## 2021-05-22 ENCOUNTER — Other Ambulatory Visit: Payer: Self-pay | Admitting: Physical Medicine and Rehabilitation

## 2021-05-22 DIAGNOSIS — M5416 Radiculopathy, lumbar region: Secondary | ICD-10-CM

## 2021-05-22 NOTE — Discharge Instructions (Signed)

## 2021-07-03 ENCOUNTER — Other Ambulatory Visit: Payer: Self-pay | Admitting: Family

## 2021-07-10 ENCOUNTER — Telehealth: Payer: Self-pay | Admitting: Specialist

## 2021-07-10 NOTE — Telephone Encounter (Signed)
Pt called requesting a referral been sent to [facility] for her injections. Please call pt about this matter at [phone]

## 2021-07-13 ENCOUNTER — Telehealth: Payer: Self-pay | Admitting: Specialist

## 2021-07-13 NOTE — Telephone Encounter (Signed)
Patient called advised she need to be referred to [facility] imaging for an injection her left sciatic nerve by Dr. Burke Keels  Patient said she need a nerve block in her right shoulder. The number to contact patient is [phone]

## 2021-07-14 NOTE — Telephone Encounter (Signed)
Please see other message sent to Dr. Louanne Skye

## 2021-07-30 ENCOUNTER — Other Ambulatory Visit: Payer: Self-pay | Admitting: Rehabilitation

## 2021-07-30 ENCOUNTER — Ambulatory Visit: Payer: Medicare Other | Admitting: Specialist

## 2021-08-06 ENCOUNTER — Other Ambulatory Visit: Payer: Self-pay

## 2021-08-06 ENCOUNTER — Ambulatory Visit (INDEPENDENT_AMBULATORY_CARE_PROVIDER_SITE_OTHER): Payer: Medicare Other | Admitting: Specialist

## 2021-08-06 ENCOUNTER — Encounter: Payer: Self-pay | Admitting: Specialist

## 2021-08-06 VITALS — BP 132/84 | HR 74 | Ht 64.0 in | Wt 170.0 lb

## 2021-08-06 DIAGNOSIS — S32010A Wedge compression fracture of first lumbar vertebra, initial encounter for closed fracture: Secondary | ICD-10-CM | POA: Diagnosis not present

## 2021-08-06 DIAGNOSIS — M5416 Radiculopathy, lumbar region: Secondary | ICD-10-CM | POA: Diagnosis not present

## 2021-08-06 DIAGNOSIS — M546 Pain in thoracic spine: Secondary | ICD-10-CM

## 2021-08-06 DIAGNOSIS — G894 Chronic pain syndrome: Secondary | ICD-10-CM | POA: Diagnosis not present

## 2021-08-06 DIAGNOSIS — M4722 Other spondylosis with radiculopathy, cervical region: Secondary | ICD-10-CM

## 2021-08-06 DIAGNOSIS — M4322 Fusion of spine, cervical region: Secondary | ICD-10-CM

## 2021-08-06 DIAGNOSIS — M25511 Pain in right shoulder: Secondary | ICD-10-CM
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
## 2021-08-06 NOTE — Patient Instructions (Signed)
Avoid bending, stooping and avoid lifting weights greater than 10 lbs. Avoid prolong standing and walking. Avoid frequent bending and stooping  No lifting greater than 10 lbs. May use ice or moist heat for pain. Weight loss is of benefit. Handicap license is approved. [facility] will call to arrange for medial nerve blocks to the facet joints L3, L4 and L5 bilaterally with Dr. Darliss Cheney and this will determine if radiofrequency ablation of the medial branch nerves to the facet joints.

## 2021-08-10 ENCOUNTER — Other Ambulatory Visit: Payer: Self-pay | Admitting: Specialist

## 2021-08-10 DIAGNOSIS — M479 Spondylosis, unspecified: Secondary | ICD-10-CM

## 2021-08-18 ENCOUNTER — Other Ambulatory Visit: Payer: Self-pay | Admitting: Specialist

## 2021-08-18 ENCOUNTER — Ambulatory Visit
Admission: RE | Admit: 2021-08-18 | Discharge: 2021-08-18 | Disposition: A | Discharge status: Home / Self Care | Payer: Medicare Other | Source: Ambulatory Visit | Attending: Specialist | Admitting: Specialist

## 2021-08-18 ENCOUNTER — Other Ambulatory Visit: Payer: Self-pay | Admitting: Sports Medicine

## 2021-08-18 ENCOUNTER — Other Ambulatory Visit: Payer: Self-pay

## 2021-08-18 DIAGNOSIS — M5412 Radiculopathy, cervical region: Secondary | ICD-10-CM

## 2021-08-18 DIAGNOSIS — M479 Spondylosis, unspecified: Secondary | ICD-10-CM

## 2021-08-18 DIAGNOSIS — M542 Cervicalgia: Secondary | ICD-10-CM

## 2021-08-18 NOTE — Discharge Instructions (Signed)

## 2021-08-19 ENCOUNTER — Telehealth: Payer: Self-pay | Admitting: Specialist

## 2021-08-19 NOTE — Telephone Encounter (Signed)
Please advise if you would like to have new referral to be placed

## 2021-08-19 NOTE — Telephone Encounter (Signed)
Pt called requesting a call back. She states she need another referral sent to [facility] for a cervical nerve block abrasion. Pt phone number is [phone]

## 2021-08-20 ENCOUNTER — Telehealth: Payer: Self-pay | Admitting: Specialist

## 2021-08-20 NOTE — Telephone Encounter (Signed)
She states she need another referral sent to [facility] for a cervical nerve block/ L1 and L3-5 both side abrasion from [place]. The best phone is [phone] and fax number [phone] for the facility and the best pt number is [phone]. Marland Kitchen

## 2021-08-20 NOTE — Telephone Encounter (Signed)
Please see message below. Okay for requested referral

## 2021-08-28 ENCOUNTER — Telehealth: Payer: Self-pay | Admitting: *Deleted

## 2021-08-28 NOTE — Telephone Encounter (Signed)
She states she need another referral sent to [facility] for a cervical nerve block/ L1 and L3-5 both side abrasion from [place]. The best phone is [phone] and fax number [phone] for the facility and the best pt number is [phone]. Marland Kitchen

## 2021-08-31 NOTE — Telephone Encounter (Signed)
Printed for Dr. Louanne Skye to review.

## 2021-09-01 ENCOUNTER — Other Ambulatory Visit: Payer: Self-pay | Admitting: Orthopedic Surgery

## 2021-09-01 NOTE — Telephone Encounter (Signed)
Pt called asking to have a CB as soon as Dr. Louanne Skye has sent the referral to [facility] for Dr.Derry. She states she's in a lot of pain and this is her 5th time calling about this.   [phone]

## 2021-09-03 ENCOUNTER — Ambulatory Visit: Payer: Medicare Other | Admitting: Specialist

## 2021-09-04 ENCOUNTER — Other Ambulatory Visit: Payer: Self-pay | Admitting: Specialist

## 2021-09-04 NOTE — Telephone Encounter (Signed)
I tried to call, but according to message her calls are being screened and they are not accepting calls from our number. If patient calls back then she needs to get Dr. Arrie Eastern to order it as he is seeing her for her neck, this is per Dr. Louanne Skye

## 2021-09-05 ENCOUNTER — Other Ambulatory Visit: Payer: Self-pay

## 2021-09-05 ENCOUNTER — Ambulatory Visit
Admission: RE | Admit: 2021-09-05 | Discharge: 2021-09-05 | Disposition: A | Discharge status: Home / Self Care | Payer: Medicare Other | Source: Ambulatory Visit | Attending: Sports Medicine | Admitting: Sports Medicine

## 2021-09-05 DIAGNOSIS — M5412 Radiculopathy, cervical region: Secondary | ICD-10-CM

## 2021-09-05 DIAGNOSIS — M542 Cervicalgia: Secondary | ICD-10-CM

## 2021-09-07 ENCOUNTER — Other Ambulatory Visit: Payer: Self-pay | Admitting: Specialist

## 2021-09-07 DIAGNOSIS — M5416 Radiculopathy, lumbar region: Secondary | ICD-10-CM

## 2021-09-08 ENCOUNTER — Telehealth: Payer: Self-pay | Admitting: *Deleted

## 2021-09-08 NOTE — Telephone Encounter (Signed)
Raven Hansen with [facility] called stating pt is needing an RFA order put in before her appt scheduled on 09/10/21. Please place order. thanks

## 2021-09-09 NOTE — Telephone Encounter (Signed)
Per Dr. Louanne Skye if this is for her neck, NO we will not order it since she is seeing Dr. Arrie Eastern at [facility] for her neck. He did however place an order for RFA on 08/06/21 for her lower back.

## 2021-09-10 ENCOUNTER — Ambulatory Visit
Admission: RE | Admit: 2021-09-10 | Discharge: 2021-09-10 | Disposition: A | Discharge status: Home / Self Care | Payer: Medicare Other | Source: Ambulatory Visit | Attending: Specialist | Admitting: Specialist

## 2021-09-10 ENCOUNTER — Other Ambulatory Visit: Payer: Self-pay | Admitting: Specialist

## 2021-09-10 DIAGNOSIS — M5416 Radiculopathy, lumbar region: Secondary | ICD-10-CM

## 2021-09-10 MED ORDER — METHYLPREDNISOLONE ACETATE 40 MG/ML INJ SUSP (RADIOLOG
120.0000 mg | Freq: Once | INTRAMUSCULAR | Status: AC
  Administered 2021-09-10: 11:00:00 120 mg via INTRALESIONAL

## 2021-09-10 MED ORDER — SODIUM CHLORIDE 0.9 % IV SOLN: INTRAVENOUS | Status: DC

## 2021-09-10 MED ORDER — FENTANYL CITRATE PF 50 MCG/ML IJ SOSY
25.0000 ug | PREFILLED_SYRINGE | INTRAMUSCULAR | Status: DC | PRN
Start: 2021-09-10 — End: 2021-09-11
  Administered 2021-09-10: 10:00:00 50 ug via INTRAVENOUS
  Administered 2021-09-10: 10:00:00 25 ug via INTRAVENOUS

## 2021-09-10 MED ORDER — MIDAZOLAM HCL 2 MG/2ML IJ SOLN
1.0000 mg | INTRAMUSCULAR | Status: DC | PRN
  Administered 2021-09-10 (×2): 1 mg via INTRAVENOUS

## 2021-09-10 MED ORDER — KETOROLAC TROMETHAMINE 30 MG/ML IJ SOLN
30.0000 mg | Freq: Once | INTRAMUSCULAR | Status: AC
  Administered 2021-09-10: 10:00:00 30 mg via INTRAVENOUS

## 2021-09-10 NOTE — Progress Notes (Signed)
Pt back in nursing recovery area. Pt awake post procedure now. Pt follows commands, talks in complete sentences and has no complaints at this time. Pt eating crackers at this time. Pt will be monitored until discharged by Radiologist.

## 2021-09-10 NOTE — Discharge Instructions (Signed)
Radio Frequency Ablation Post Procedure Discharge Instructions ? ?May resume a regular diet and any medications that you routinely take (including pain medications). ?No driving day of procedure. ?Upon discharge go home and rest for at least 4 hours.  May use an ice pack as needed to injection sites on back. ?Remove bandades later, today. ? ? ? ?Please contact our office at [phone] for the following symptoms: ? ?Fever greater than 100 degrees ?Increased swelling, pain, or redness at injection site. ? ? ?Thank you for visiting [facility].  ?

## 2021-09-11 NOTE — Telephone Encounter (Signed)
So, I just sw [place] with [facility] and she stated that pt came in yesterday and had the RFA of lumbar but the order was not signed that was sent on 08/06/21, wants to know if nitka can cosign the order if he can, if not could he send fax something stating that it was OK to do RFA lumbar.   Again, this was done without order being signed.

## 2021-09-11 NOTE — Telephone Encounter (Signed)
Raven Hansen is waiting for [facility] to fax her something to get JN to sign

## 2021-10-20 ENCOUNTER — Telehealth: Payer: Self-pay | Admitting: Specialist

## 2021-10-20 NOTE — Telephone Encounter (Signed)
Pt called requesting a nerve block referral. Please call pt at [phone]

## 2021-10-22 ENCOUNTER — Other Ambulatory Visit: Payer: Self-pay | Admitting: Pain Medicine

## 2021-10-22 DIAGNOSIS — G8929 Other chronic pain: Secondary | ICD-10-CM

## 2021-10-23 NOTE — Telephone Encounter (Signed)
Holding for Christy. 

## 2021-11-03 ENCOUNTER — Other Ambulatory Visit: Payer: Self-pay

## 2021-11-03 ENCOUNTER — Ambulatory Visit
Admission: RE | Admit: 2021-11-03 | Discharge: 2021-11-03 | Disposition: A | Discharge status: Home / Self Care | Payer: Commercial Managed Care - HMO | Source: Ambulatory Visit | Attending: Pain Medicine | Admitting: Pain Medicine

## 2021-11-03 DIAGNOSIS — G8929 Other chronic pain: Secondary | ICD-10-CM

## 2021-11-03 MED ORDER — METHYLPREDNISOLONE ACETATE 40 MG/ML INJ SUSP (RADIOLOG
80.0000 mg | Freq: Once | INTRAMUSCULAR | Status: AC
  Administered 2021-11-03: 80 mg via EPIDURAL

## 2021-11-03 MED ORDER — IOPAMIDOL (ISOVUE-M 200) INJECTION 41%
1.0000 mL | Freq: Once | INTRAMUSCULAR | Status: AC
  Administered 2021-11-03: 1 mL via EPIDURAL

## 2021-11-03 NOTE — Discharge Instructions (Signed)

## 2021-11-25 ENCOUNTER — Other Ambulatory Visit: Payer: Medicare Other

## 2021-12-16 ENCOUNTER — Telehealth: Payer: Self-pay | Admitting: Specialist

## 2021-12-16 NOTE — Telephone Encounter (Signed)
Patient called needing to get an injection in her back before she moves back to [place] To take care of her father. Patient said she will be leaving 12/30/2021. The number to contact patient is [phone]

## 2021-12-18 ENCOUNTER — Other Ambulatory Visit: Payer: Self-pay | Admitting: Specialist

## 2021-12-18 DIAGNOSIS — M48061 Spinal stenosis, lumbar region without neurogenic claudication: Secondary | ICD-10-CM

## 2021-12-18 NOTE — Telephone Encounter (Signed)
Order placed

## 2021-12-21 NOTE — Telephone Encounter (Signed)
Per chart, patient has been scheduled.

## 2021-12-23 ENCOUNTER — Ambulatory Visit: Payer: Commercial Managed Care - HMO | Admitting: Orthopaedic Surgery

## 2021-12-23 ENCOUNTER — Ambulatory Visit
Admission: RE | Admit: 2021-12-23 | Discharge: 2021-12-23 | Disposition: A | Discharge status: Home / Self Care | Payer: Medicare Other | Source: Ambulatory Visit | Attending: Specialist | Admitting: Specialist

## 2021-12-23 ENCOUNTER — Other Ambulatory Visit: Payer: Medicare Other

## 2021-12-23 ENCOUNTER — Other Ambulatory Visit: Payer: Self-pay

## 2021-12-23 DIAGNOSIS — M48061 Spinal stenosis, lumbar region without neurogenic claudication: Secondary | ICD-10-CM

## 2021-12-23 MED ORDER — IOPAMIDOL (ISOVUE-M 200) INJECTION 41%
1.0000 mL | Freq: Once | INTRAMUSCULAR | Status: AC
  Administered 2021-12-23: 1 mL via EPIDURAL

## 2021-12-23 MED ORDER — METHYLPREDNISOLONE ACETATE 40 MG/ML INJ SUSP (RADIOLOG
80.0000 mg | Freq: Once | INTRAMUSCULAR | Status: AC
  Administered 2021-12-23: 80 mg via EPIDURAL

## 2021-12-23 NOTE — Discharge Instructions (Signed)

## 2022-02-18 ENCOUNTER — Other Ambulatory Visit: Payer: Self-pay | Admitting: Radiology

## 2022-02-18 ENCOUNTER — Telehealth: Payer: Self-pay

## 2022-02-18 DIAGNOSIS — M48061 Spinal stenosis, lumbar region without neurogenic claudication: Secondary | ICD-10-CM

## 2022-02-18 DIAGNOSIS — M546 Pain in thoracic spine: Secondary | ICD-10-CM

## 2022-02-18 DIAGNOSIS — M5416 Radiculopathy, lumbar region: Secondary | ICD-10-CM

## 2022-02-18 DIAGNOSIS — G894 Chronic pain syndrome: Secondary | ICD-10-CM

## 2022-02-18 DIAGNOSIS — M4722 Other spondylosis with radiculopathy, cervical region: Secondary | ICD-10-CM

## 2022-02-18 DIAGNOSIS — M545 Low back pain, unspecified: Secondary | ICD-10-CM

## 2022-02-18 DIAGNOSIS — M4322 Fusion of spine, cervical region: Secondary | ICD-10-CM

## 2022-02-18 NOTE — Telephone Encounter (Signed)
Pt called and states that she has moved permanently to [facility] She states that in order for her to make appt at pain management she needs referral to two separate places.  ?[facility] fax # [phone]  ?2. [facility]  phone# [phone] and fax # [phone]  ?pt's contact number is [phone]. She states that she is terrible pain and needs help emergently both facilities are requesting 6 months of office notes and a referral to make appt.  ?

## 2022-02-18 NOTE — Telephone Encounter (Signed)
Orders have been placed for her referrals. ?

## 2022-07-18 ENCOUNTER — Encounter: Payer: Self-pay | Admitting: Specialist
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
## 2024-04-20 ENCOUNTER — Encounter (HOSPITAL_COMMUNITY): Payer: Self-pay | Admitting: Interventional Radiology
